# Patient Record
Sex: Female | Born: 1980 | Race: Black or African American | Hispanic: No | Marital: Married | Smoking: Former smoker
Health system: Southern US, Community
[De-identification: ages and names within clinical notes are randomized; demographics above are authoritative.]

## PROBLEM LIST (undated history)

## (undated) DIAGNOSIS — O24419 Gestational diabetes mellitus in pregnancy, unspecified control: Secondary | ICD-10-CM

## (undated) DIAGNOSIS — F419 Anxiety disorder, unspecified: Secondary | ICD-10-CM

## (undated) DIAGNOSIS — I10 Essential (primary) hypertension: Secondary | ICD-10-CM

## (undated) DIAGNOSIS — B019 Varicella without complication: Secondary | ICD-10-CM

## (undated) HISTORY — DX: Anxiety disorder, unspecified: F41.9

## (undated) HISTORY — DX: Varicella without complication: B01.9

---
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
## 2000-03-28 ENCOUNTER — Other Ambulatory Visit: Admission: RE | Admit: 2000-03-28 | Discharge: 2000-03-28 | Payer: Self-pay | Admitting: Obstetrics and Gynecology

## 2008-11-24 ENCOUNTER — Encounter: Admission: RE | Admit: 2008-11-24 | Discharge: 2009-01-12 | Payer: Self-pay | Admitting: Obstetrics and Gynecology

## 2009-01-16 ENCOUNTER — Inpatient Hospital Stay (HOSPITAL_COMMUNITY): Admission: AD | Admit: 2009-01-16 | Discharge: 2009-01-20 | Payer: Self-pay | Admitting: Obstetrics and Gynecology

## 2009-11-29 ENCOUNTER — Other Ambulatory Visit (HOSPITAL_COMMUNITY): Payer: Self-pay | Admitting: Obstetrics and Gynecology

## 2009-12-02 ENCOUNTER — Encounter (INDEPENDENT_AMBULATORY_CARE_PROVIDER_SITE_OTHER): Payer: Self-pay | Admitting: Obstetrics and Gynecology

## 2009-12-02 ENCOUNTER — Inpatient Hospital Stay (HOSPITAL_COMMUNITY): Admission: AD | Admit: 2009-12-02 | Discharge: 2009-12-05 | Payer: Self-pay | Admitting: Obstetrics and Gynecology

## 2010-03-13 ENCOUNTER — Other Ambulatory Visit (HOSPITAL_COMMUNITY): Payer: Self-pay | Admitting: Obstetrics and Gynecology

## 2010-03-28 ENCOUNTER — Ambulatory Visit: Payer: Self-pay | Admitting: Internal Medicine

## 2010-03-28 LAB — URINALYSIS, ROUTINE W REFLEX MICROSCOPIC
Bilirubin Urine: NEGATIVE
Glucose, UA: NEGATIVE mg/dL
Ketones, ur: NEGATIVE mg/dL
Leukocytes, UA: NEGATIVE
Nitrite: NEGATIVE
Protein, ur: 100 mg/dL — AB
Specific Gravity, Urine: 1.025 (ref 1.005–1.030)
Urobilinogen, UA: 0.2 mg/dL (ref 0.0–1.0)
pH: 6 (ref 5.0–8.0)

## 2010-03-28 LAB — CBC
HCT: 33.3 % — ABNORMAL LOW (ref 36.0–46.0)
HCT: 39.6 % (ref 36.0–46.0)
Hemoglobin: 11.4 g/dL — ABNORMAL LOW (ref 12.0–15.0)
Hemoglobin: 13.3 g/dL (ref 12.0–15.0)
MCH: 30.8 pg (ref 26.0–34.0)
MCH: 31.1 pg (ref 26.0–34.0)
MCHC: 33.3 g/dL (ref 30.0–36.0)
MCHC: 33.6 g/dL (ref 30.0–36.0)
MCV: 92.4 fL (ref 78.0–100.0)
MCV: 92.7 fL (ref 78.0–100.0)
Platelets: 223 10*3/uL (ref 150–400)
Platelets: 225 10*3/uL (ref 150–400)
RBC: 4.14 MIL/uL (ref 3.87–5.11)
RBC: 4.28 MIL/uL (ref 3.87–5.11)
RDW: 13.5 % (ref 11.5–15.5)
RDW: 13.5 % (ref 11.5–15.5)
WBC: 7.6 10*3/uL (ref 4.0–10.5)
WBC: 8.5 10*3/uL (ref 4.0–10.5)
WBC: 9.2 10*3/uL (ref 4.0–10.5)

## 2010-03-28 LAB — COMPREHENSIVE METABOLIC PANEL
AST: 21 U/L (ref 0–37)
AST: 26 U/L (ref 0–37)
Albumin: 2.9 g/dL — ABNORMAL LOW (ref 3.5–5.2)
Alkaline Phosphatase: 117 U/L (ref 39–117)
BUN: 5 mg/dL — ABNORMAL LOW (ref 6–23)
CO2: 25 mEq/L (ref 19–32)
Calcium: 8.5 mg/dL (ref 8.4–10.5)
Chloride: 107 mEq/L (ref 96–112)
Creatinine, Ser: 0.61 mg/dL (ref 0.4–1.2)
GFR calc Af Amer: >60 mL/min (ref >60)
GFR calc Af Amer: >60 mL/min (ref >60)
GFR calc non Af Amer: >60 mL/min (ref >60)
Glucose, Bld: 90 mg/dL (ref 70–99)
Potassium: 3.8 mEq/L (ref 3.5–5.1)
Sodium: 133 mEq/L — ABNORMAL LOW (ref 135–145)
Total Bilirubin: 0.3 mg/dL (ref 0.3–1.2)

## 2010-03-28 LAB — SURGICAL PCR SCREEN
MRSA, PCR: NEGATIVE
Staphylococcus aureus: NEGATIVE

## 2010-03-28 LAB — URIC ACID: Uric Acid, Serum: 4.4 mg/dL (ref 2.4–7.0)

## 2010-03-28 LAB — URINE MICROSCOPIC-ADD ON

## 2010-03-28 LAB — RPR: RPR Ser Ql: NONREACTIVE

## 2010-03-28 LAB — LACTATE DEHYDROGENASE: LDH: 157 U/L (ref 94–250)

## 2010-04-02 LAB — CBC
Hemoglobin: 10.4 g/dL — ABNORMAL LOW (ref 12.0–15.0)
MCHC: 33.7 g/dL (ref 30.0–36.0)
MCV: 95.6 fL (ref 78.0–100.0)
RBC: 3.22 MIL/uL — ABNORMAL LOW (ref 3.87–5.11)
RBC: 4.14 MIL/uL (ref 3.87–5.11)
WBC: 9.4 10*3/uL (ref 4.0–10.5)
WBC: 9.7 10*3/uL (ref 4.0–10.5)

## 2010-04-02 LAB — COMPREHENSIVE METABOLIC PANEL
AST: 26 U/L (ref 0–37)
Albumin: 2.9 g/dL — ABNORMAL LOW (ref 3.5–5.2)
Alkaline Phosphatase: 112 U/L (ref 39–117)
Chloride: 104 mEq/L (ref 96–112)
GFR calc Af Amer: >60 mL/min (ref >60)
Potassium: 4.2 mEq/L (ref 3.5–5.1)
Sodium: 133 mEq/L — ABNORMAL LOW (ref 135–145)
Total Bilirubin: 0.7 mg/dL (ref 0.3–1.2)
Total Protein: 6 g/dL (ref 6.0–8.3)

## 2010-04-02 LAB — RPR: RPR Ser Ql: NONREACTIVE

## 2010-04-20 ENCOUNTER — Ambulatory Visit: Payer: Self-pay | Admitting: Internal Medicine

## 2010-07-13 ENCOUNTER — Ambulatory Visit: Payer: Self-pay | Admitting: Internal Medicine

## 2010-07-13 DIAGNOSIS — Z0289 Encounter for other administrative examinations: Secondary | ICD-10-CM

## 2012-05-08 ENCOUNTER — Ambulatory Visit: Payer: Self-pay | Admitting: Internal Medicine

## 2012-06-13 ENCOUNTER — Other Ambulatory Visit (INDEPENDENT_AMBULATORY_CARE_PROVIDER_SITE_OTHER): Payer: BC Managed Care – PPO

## 2012-06-13 ENCOUNTER — Ambulatory Visit (INDEPENDENT_AMBULATORY_CARE_PROVIDER_SITE_OTHER): Payer: BC Managed Care – PPO | Admitting: Internal Medicine

## 2012-06-13 ENCOUNTER — Encounter: Payer: Self-pay | Admitting: Internal Medicine

## 2012-06-13 VITALS — BP 148/82 | HR 105 | Temp 98.7°F | Ht 64.0 in | Wt 193.0 lb

## 2012-06-13 DIAGNOSIS — Z130 Encounter for screening for diseases of the blood and blood-forming organs and certain disorders involving the immune mechanism: Secondary | ICD-10-CM

## 2012-06-13 DIAGNOSIS — Z1329 Encounter for screening for other suspected endocrine disorder: Secondary | ICD-10-CM

## 2012-06-13 DIAGNOSIS — Z13220 Encounter for screening for lipoid disorders: Secondary | ICD-10-CM

## 2012-06-13 DIAGNOSIS — Z0000 Encounter for general adult medical examination without abnormal findings: Secondary | ICD-10-CM

## 2012-06-13 DIAGNOSIS — Z131 Encounter for screening for diabetes mellitus: Secondary | ICD-10-CM

## 2012-06-13 DIAGNOSIS — F329 Major depressive disorder, single episode, unspecified: Secondary | ICD-10-CM | POA: Insufficient documentation

## 2012-06-13 DIAGNOSIS — F411 Generalized anxiety disorder: Secondary | ICD-10-CM

## 2012-06-13 DIAGNOSIS — I10 Essential (primary) hypertension: Secondary | ICD-10-CM

## 2012-06-13 DIAGNOSIS — Z23 Encounter for immunization: Secondary | ICD-10-CM

## 2012-06-13 DIAGNOSIS — F32A Depression, unspecified: Secondary | ICD-10-CM | POA: Insufficient documentation

## 2012-06-13 LAB — BASIC METABOLIC PANEL
BUN: 15 mg/dL (ref 6–23)
CO2: 27 mEq/L (ref 19–32)
Calcium: 9.8 mg/dL (ref 8.4–10.5)
Chloride: 104 mEq/L (ref 96–112)
Creatinine, Ser: 0.8 mg/dL (ref 0.4–1.2)

## 2012-06-13 LAB — CBC
HCT: 44.6 % (ref 36.0–46.0)
Hemoglobin: 15.3 g/dL — ABNORMAL HIGH (ref 12.0–15.0)
MCHC: 34.3 g/dL (ref 30.0–36.0)
Platelets: 279 10*3/uL (ref 150.0–400.0)
RDW: 13.3 % (ref 11.5–14.6)
WBC: 8.3 10*3/uL (ref 4.5–10.5)

## 2012-06-13 LAB — TSH: TSH: 0.51 u[IU]/mL (ref 0.35–5.50)

## 2012-06-13 LAB — LIPID PANEL
Total CHOL/HDL Ratio: 4
Triglycerides: 84 mg/dL (ref 0.0–149.0)

## 2012-06-13 LAB — HEMOGLOBIN A1C: Hgb A1c MFr Bld: 5.7 % (ref 4.6–6.5)

## 2012-06-13 LAB — LDL CHOLESTEROL, DIRECT: Direct LDL: 176 mg/dL

## 2012-06-13 MED ORDER — FLUOXETINE HCL 10 MG PO CAPS: 10.0000 mg | ORAL_CAPSULE | Freq: Every day | ORAL | Status: DC

## 2012-06-13 MED ORDER — LISINOPRIL 10 MG PO TABS: 10.0000 mg | ORAL_TABLET | Freq: Every day | ORAL | Status: DC

## 2012-06-13 NOTE — Assessment & Plan Note (Signed)
Reassurance given Pt would not like to talk with a therapist at this time Will start prozac 10 mg daily Call me in 1 month to let me know how you are feeling

## 2012-06-13 NOTE — Patient Instructions (Signed)

## 2012-06-13 NOTE — Progress Notes (Signed)
HPI  Pt presents to the clinic today to establish care. She has not seen a PCP in the past. She only sees urgent care when needed. She does have some concerns about her blood pressure. It has range 128-160/80-90. She did have a history of high blood pressure while pregnant and was on Lisinopril. They took her off after she delivered. She also has some concerns about anxiety. She does have a lot of stress which is causing anxiety. 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 She would like to start something to help her with anxiety. She does have a very supportive family and friends. She denies SI/HI.  Flu: 2012 Tetanus: unknown Pap: 2011 Eye doctor: as needed Dentist: as needed LMP: Mirena IUD  History reviewed. No pertinent past medical history.  No current outpatient prescriptions on file.   No current facility-administered medications for this visit.    No Known Allergies  Family History  Problem Relation Age of Onset  . Hypertension Mother   . Heart disease Father   . Hyperlipidemia Father   . Hypertension Father   . Breast cancer Paternal Grandmother     History   Social History  . Marital Status: Single    Spouse Name: N/A    Number of Children: 2  . Years of Education: 12   Occupational History  . Student/Homemaker    Social History Main Topics  . Smoking status: Current Some Day Smoker  . Smokeless tobacco: Never Used  . Alcohol Use: Yes  . Drug Use: Yes  . Sexually Active: Not on file   Other Topics Concern  . Not on file   Social History Narrative   Regular exercise-yes   Caffeine Use-yes    ROS:  Constitutional: Denies fever, malaise, fatigue, headache or abrupt weight changes.  HEENT: Denies eye pain, eye redness, ear pain, ringing in the ears, wax buildup, runny nose, nasal congestion, bloody nose, or sore throat. Respiratory: Denies difficulty breathing, shortness of breath, cough or sputum production.   Cardiovascular:  Denies chest pain, chest tightness, palpitations or swelling in the hands or feet.  Gastrointestinal: Denies abdominal pain, bloating, constipation, diarrhea or blood in the stool.  GU: Denies frequency, urgency, pain with urination, blood in urine, odor or discharge. Musculoskeletal: Denies decrease in range of motion, difficulty with gait, muscle pain or joint pain and swelling.  Skin: Denies redness, rashes, lesions or ulcercations.  Neurological: Denies dizziness, difficulty with memory, difficulty with speech or problems with balance and coordination.   No other specific complaints in a complete review of systems (except as listed in HPI above).  PE:  BP 148/82  Pulse 105  Temp(Src) 98.7 F (37.1 C) (Oral)  Ht 5\' 4"  (1.626 m)  Wt 193 lb (87.544 kg)  BMI 33.11 kg/m2  SpO2 99%  LMP 04/15/2012 Wt Readings from Last 3 Encounters:  06/13/12 193 lb (87.544 kg)    General: Appears her stated age,overweight but well developed, well nourished in NAD. HEENT: Head: normal shape and size; Eyes: sclera white, no icterus, conjunctiva pink, PERRLA and EOMs intact; Ears: Tm's gray and intact, normal light reflex; Nose: mucosa pink and moist, septum midline; Throat/Mouth: Teeth present, mucosa pink and moist, no lesions or ulcerations noted.  Neck: Normal range of motion. Neck supple, trachea midline. No massses, lumps or thyromegaly present.  Cardiovascular: Normal rate and rhythm. S1,S2 noted.  No murmur, rubs or gallops noted. No JVD or BLE edema. No carotid bruits noted.  Pulmonary/Chest: Normal effort and positive vesicular breath sounds. No respiratory distress. No wheezes, rales or ronchi noted.  Abdomen: Soft and nontender. Normal bowel sounds, no bruits noted. No distention or masses noted. Liver, spleen and kidneys non palpable. Musculoskeletal: Normal range of motion. No signs of joint swelling. No difficulty with gait.  Neurological: Alert and oriented. Cranial nerves II-XII intact.  Coordination normal. +DTRs bilaterally. Psychiatric: Mood anxious and affect normal. Behavior is normal. Judgment and thought content normal.      Assessment and Plan:  Preventative Health Maintenance:  Encouraged pt to work on diet and exercise Tdap given today Will obtain basic labs

## 2012-06-13 NOTE — Assessment & Plan Note (Signed)
Will restart Lisinopril today Continue to monitor pressures Continue to work on diet and exercise  RTC in 1 month for followup

## 2012-06-13 NOTE — Addendum Note (Signed)
Addended by: Brenton Grills C on: 06/13/2012 11:57 AM   Modules accepted: Orders

## 2012-06-18 ENCOUNTER — Telehealth: Payer: Self-pay | Admitting: *Deleted

## 2012-06-18 NOTE — Telephone Encounter (Signed)
Left msg on triage stating receive results through mychart but want to speak with nurse need clarification...Victoria Ryan

## 2012-06-18 NOTE — Telephone Encounter (Signed)
Left message for pt to callback office.  

## 2012-06-19 NOTE — Telephone Encounter (Signed)
Pt informed of NP's advisement regarding cholesterol labs.

## 2012-07-01 ENCOUNTER — Telehealth: Payer: Self-pay

## 2012-07-01 NOTE — Telephone Encounter (Signed)
Patient called to inform NP that current dose of prozac has not helped to control her anxiety, it has also caused her " right eye to twitch". Pt would like to know what NP advises. Thanks

## 2012-07-01 NOTE — Telephone Encounter (Signed)
Returned call to pt//unable to leave VM per voice prompt

## 2012-07-01 NOTE — Telephone Encounter (Signed)
She has not given the prozac enough time to work. I told her 4-6 weeks to see full effects and even then we may need to go up on the dose. The ey twitching could be due to stress or anxiety, not necessarily the medication. I want her to give it a few more weeks to see if it is effective or not

## 2012-07-02 NOTE — Telephone Encounter (Signed)
Called pt, no answer x 3// closing phone note until pt calls back.

## 2012-07-02 NOTE — Telephone Encounter (Signed)
Called patient // no answer unable to accept VM

## 2012-11-20 ENCOUNTER — Other Ambulatory Visit: Payer: Self-pay

## 2013-10-30 ENCOUNTER — Other Ambulatory Visit: Payer: Self-pay

## 2014-03-04 ENCOUNTER — Encounter (HOSPITAL_COMMUNITY): Payer: Self-pay | Admitting: Emergency Medicine

## 2014-03-04 ENCOUNTER — Emergency Department (HOSPITAL_COMMUNITY)
Admission: EM | Admit: 2014-03-04 | Discharge: 2014-03-04 | Disposition: A | Discharge status: Home / Self Care | Payer: BLUE CROSS/BLUE SHIELD | Attending: Emergency Medicine | Admitting: Emergency Medicine

## 2014-03-04 DIAGNOSIS — Z87891 Personal history of nicotine dependence: Secondary | ICD-10-CM | POA: Insufficient documentation

## 2014-03-04 DIAGNOSIS — I10 Essential (primary) hypertension: Secondary | ICD-10-CM | POA: Insufficient documentation

## 2014-03-04 DIAGNOSIS — Z79899 Other long term (current) drug therapy: Secondary | ICD-10-CM | POA: Insufficient documentation

## 2014-03-04 DIAGNOSIS — Z3202 Encounter for pregnancy test, result negative: Secondary | ICD-10-CM | POA: Insufficient documentation

## 2014-03-04 HISTORY — DX: Essential (primary) hypertension: I10

## 2014-03-04 LAB — I-STAT CHEM 8, ED
BUN: 17 mg/dL (ref 6–23)
Calcium, Ion: 1.15 mmol/L (ref 1.12–1.23)
Chloride: 103 mmol/L (ref 96–112)
Creatinine, Ser: 0.8 mg/dL (ref 0.50–1.10)
Glucose, Bld: 137 mg/dL — ABNORMAL HIGH (ref 70–99)
HCT: 50 % — ABNORMAL HIGH (ref 36.0–46.0)
Hemoglobin: 17 g/dL — ABNORMAL HIGH (ref 12.0–15.0)
POTASSIUM: 3.7 mmol/L (ref 3.5–5.1)
SODIUM: 141 mmol/L (ref 135–145)
TCO2: 24 mmol/L (ref 0–100)

## 2014-03-04 LAB — I-STAT BETA HCG BLOOD, ED (MC, WL, AP ONLY): I-stat hCG, quantitative: <5 m[IU]/mL (ref <5)

## 2014-03-04 MED ORDER — CLONIDINE HCL 0.1 MG PO TABS
0.2000 mg | ORAL_TABLET | Freq: Once | ORAL | Status: AC
  Administered 2014-03-04: 0.2 mg via ORAL
  Filled 2014-03-04: qty 2

## 2014-03-04 MED ORDER — OXYCODONE-ACETAMINOPHEN 5-325 MG PO TABS
2.0000 | ORAL_TABLET | Freq: Once | ORAL | Status: AC
  Administered 2014-03-04: 2 via ORAL
  Filled 2014-03-04: qty 2

## 2014-03-04 MED ORDER — OXYCODONE-ACETAMINOPHEN 5-325 MG PO TABS: ORAL_TABLET | ORAL | Status: DC

## 2014-03-04 MED ORDER — ACETAMINOPHEN 325 MG PO TABS
975.0000 mg | ORAL_TABLET | Freq: Once | ORAL | Status: DC
  Filled 2014-03-04: qty 3

## 2014-03-04 MED ORDER — LISINOPRIL-HYDROCHLOROTHIAZIDE 10-12.5 MG PO TABS: 1.0000 | ORAL_TABLET | Freq: Every day | ORAL | Status: DC

## 2014-03-04 NOTE — Discharge Instructions (Signed)
Please follow with your primary care doctor in the next 5 days for high blood pressure evaluation. If you do not have a primary care doctor, present to urgent care. Reduce salt intake. Seek emergency medical care for unilateral weakness, slurring, change in vision, or chest pain and shortness of breath.  Take percocet for breakthrough pain, do not drink alcohol, drive, care for children or do other critical tasks while taking percocet.  Please follow with your primary care doctor in the next 2 days for a check-up. They must obtain records for further management.   Do not hesitate to return to the Emergency Department for any new, worsening or concerning symptoms.    Hypertension Hypertension, commonly called high blood pressure, is when the force of blood pumping through your arteries is too strong. Your arteries are the blood vessels that carry blood from your heart throughout your body. A blood pressure reading consists of a higher number over a lower number, such as 110/72. The higher number (systolic) is the pressure inside your arteries when your heart pumps. The lower number (diastolic) is the pressure inside your arteries when your heart relaxes. Ideally you want your blood pressure below 120/80. Hypertension forces your heart to work harder to pump blood. Your arteries may become narrow or stiff. Having hypertension puts you at risk for heart disease, stroke, and other problems.  RISK FACTORS Some risk factors for high blood pressure are controllable. Others are not.  Risk factors you cannot control include:   Race. You may be at higher risk if you are African American.  Age. Risk increases with age.  Gender. Men are at higher risk than women before age 13 years. After age 50, women are at higher risk than men. Risk factors you can control include:  Not getting enough exercise or physical activity.  Being overweight.  Getting too much fat, sugar, calories, or salt in your  diet.  Drinking too much alcohol. SIGNS AND SYMPTOMS Hypertension does not usually cause signs or symptoms. Extremely high blood pressure (hypertensive crisis) may cause headache, anxiety, shortness of breath, and nosebleed. DIAGNOSIS  To check if you have hypertension, your health care provider will measure your blood pressure while you are seated, with your arm held at the level of your heart. It should be measured at least twice using the same arm. Certain conditions can cause a difference in blood pressure between your right and left arms. A blood pressure reading that is higher than normal on one occasion does not mean that you need treatment. If one blood pressure reading is high, ask your health care provider about having it checked again. TREATMENT  Treating high blood pressure includes making lifestyle changes and possibly taking medicine. Living a healthy lifestyle can help lower high blood pressure. You may need to change some of your habits. Lifestyle changes may include:  Following the DASH diet. This diet is high in fruits, vegetables, and whole grains. It is low in salt, red meat, and added sugars.  Getting at least 2 hours of brisk physical activity every week.  Losing weight if necessary.  Not smoking.  Limiting alcoholic beverages.  Learning ways to reduce stress. If lifestyle changes are not enough to get your blood pressure under control, your health care provider may prescribe medicine. You may need to take more than one. Work closely with your health care provider to understand the risks and benefits. HOME CARE INSTRUCTIONS  Have your blood pressure rechecked as directed by your health  care provider.   Take medicines only as directed by your health care provider. Follow the directions carefully. Blood pressure medicines must be taken as prescribed. The medicine does not work as well when you skip doses. Skipping doses also puts you at risk for problems.   Do not  smoke.   Monitor your blood pressure at home as directed by your health care provider. SEEK MEDICAL CARE IF:   You think you are having a reaction to medicines taken.  You have recurrent headaches or feel dizzy.  You have swelling in your ankles.  You have trouble with your vision. SEEK IMMEDIATE MEDICAL CARE IF:  You develop a severe headache or confusion.  You have unusual weakness, numbness, or feel faint.  You have severe chest or abdominal pain.  You vomit repeatedly.  You have trouble breathing. MAKE SURE YOU:   Understand these instructions.  Will watch your condition.  Will get help right away if you are not doing well or get worse. Document Released: 01/01/2005 Document Revised: 05/18/2013 Document Reviewed: 10/24/2012 [facility] Patient Information 2015 [place], [place]. This information is not intended to replace advice given to you by your health care provider. Make sure you discuss any questions you have with your health care provider.

## 2014-03-04 NOTE — ED Provider Notes (Signed)
CSN: [id number]     Arrival date & time 03/04/14  1633 History   First MD Initiated Contact with Patient 03/04/14 1703     Chief Complaint  Patient presents with  . Hypertension     (Consider location/radiation/quality/duration/timing/severity/associated sxs/prior Treatment) HPI   Victoria Ryan is a 34 y.o. female sent from urgent care for evaluation of elevated blood lead pressure. Patient states she has a formal diagnosis of hypertension but she has not taken any blood pressure medications in 2 years to 2 issues with her insurance. Blood pressure was noticed this morning at her oral [facility]'s office and she went to have her wisdom teeth removed. Patient denies any chest pain, shortness of breath, palpitations, abdominal pain, nausea, vomiting, change in vision, dysarthria, ataxia she does endorse a mild global 2 out of 10 headache which she thinks may be related to the wisdom teeth removal. States that when she was taking blood pressure medication it was loose and occur pro hydrochlorothiazide unknown dosage. She states that she has insurance now and will not have an issue establishing care.   Past Medical History  Diagnosis Date  . Hypertension    Past Surgical History  Procedure Laterality Date  . Cesarean section     Family History  Problem Relation Age of Onset  . Hypertension Mother   . Heart disease Father   . Hyperlipidemia Father   . Hypertension Father   . Breast cancer Paternal Grandmother   . Cancer Neg Hx    History  Substance Use Topics  . Smoking status: Former Games developer  . Smokeless tobacco: Never Used  . Alcohol Use: Yes   OB History    No data available     Review of Systems  10 systems reviewed and found to be negative, except as noted in the HPI.   Allergies  Review of patient's allergies indicates no known allergies.  Home Medications   Prior to Admission medications   Medication Sig Start Date End Date Taking? Authorizing Provider   FLUoxetine (PROZAC) 10 MG capsule Take 1 capsule (10 mg total) by mouth daily. 06/13/12   Lorre Munroe, NP  lisinopril (PRINIVIL,ZESTRIL) 10 MG tablet Take 1 tablet (10 mg total) by mouth daily. 06/13/12   Lorre Munroe, NP   BP 211/133 mmHg  Pulse 107  Temp(Src) 98.4 F (36.9 C) (Oral)  Resp 18  SpO2 98% Physical Exam  Constitutional: She is oriented to person, place, and time. She appears well-developed and well-nourished. No distress.  HENT:  Head: Normocephalic and atraumatic.  Mouth/Throat: Oropharynx is clear and moist.  Eyes: Conjunctivae and EOM are normal. Pupils are equal, round, and reactive to light.  Neck: Normal range of motion. Neck supple. No JVD present. No tracheal deviation present.  Cardiovascular: Normal rate, regular rhythm and intact distal pulses.   Pulmonary/Chest: Effort normal and breath sounds normal. No stridor. No respiratory distress. She has no wheezes. She has no rales. She exhibits no tenderness.  Abdominal: Soft. Bowel sounds are normal. She exhibits no distension and no mass. There is no tenderness. There is no rebound and no guarding.  Musculoskeletal: Normal range of motion.  Neurological: She is alert and oriented to person, place, and time.  II-Visual fields grossly intact. III/IV/VI-Extraocular movements intact.  Pupils reactive bilaterally. V/VII-Smile symmetric, equal eyebrow raise,  facial sensation intact VIII- Hearing grossly intact IX/X-Normal gag XI-bilateral shoulder shrug XII-midline tongue extension Motor: 5/5 bilaterally with normal tone and bulk Cerebellar: Normal finger-to-nose  and  normal heel-to-shin test.   Romberg negative Ambulates with a coordinated gait   Psychiatric: She has a normal mood and affect.  Nursing note and vitals reviewed.   ED Course  Procedures (including critical care time) Labs Review Labs Reviewed  I-STAT CHEM 8, ED - Abnormal; Notable for the following:    Glucose, Bld 137 (*)    Hemoglobin  17.0 (*)    HCT 50.0 (*)    All other components within normal limits  I-STAT BETA HCG BLOOD, ED ([facility], [facility], [facility] ONLY)    Imaging Review No results found.   EKG Interpretation None      MDM   Final diagnoses:  Uncontrolled hypertension    Filed Vitals:   03/04/14 1657 03/04/14 1825  BP: 211/133 202/138  Pulse: 107 96  Temp: 98.4 F (36.9 C) 98.2 F (36.8 C)  TempSrc: Oral Oral  Resp: 18 17  SpO2: 98% 99%    Medications  cloNIDine (CATAPRES) tablet 0.2 mg (0.2 mg Oral Given 03/04/14 1732)  oxyCODONE-acetaminophen (PERCOCET/ROXICET) 5-325 MG per tablet 2 tablet (2 tablets Oral Given 03/04/14 1732)    Victoria Ryan is a pleasant 34 y.o. female presenting with extremely elevated blood pressure. This was noticed at her oral surgeons because she had her wisdom teeth removed this morning. Patient has been out of her lisinopril hydrochlorothiazide for 2 years. Patient is reporting a very mild headache. I think this is secondary to having her wisdom teeth removed. Her neuro exam is nonfocal. This is an asymptomatic hypertension. We've had an extensive discussion about the importance of obtaining a primary care for management of her chronic hypertension potential complications including MI or CVA.  Will restart patient's Prinzide and she will follow close for recheck in the next several days. We've had an extensive discussion of return precautions and patient verbalizes her understanding. States that she was not given pain medication from her oral [facility], I'll write her for Percocet for pain   This is a shared visit with the attending physician who personally evaluated the patient and agrees with the care plan.   Evaluation does not show pathology that would require ongoing emergent intervention or inpatient treatment. Pt is hemodynamically stable and mentating appropriately. Discussed findings and plan with patient/guardian, who agrees with care plan. All questions answered. Return  precautions discussed and outpatient follow up given.   Discharge Medication List as of 03/04/2014  6:44 PM    START taking these medications   Details  lisinopril-hydrochlorothiazide (PRINZIDE) 10-12.5 MG per tablet Take 1 tablet by mouth daily., Starting 03/04/2014, Until Discontinued, Print    oxyCODONE-acetaminophen (PERCOCET/ROXICET) 5-325 MG per tablet 1 to 2 tabs PO q6hrs  PRN for pain, Print             Wynetta Emery, PA-C 03/04/14 2156  Purvis Sheffield, MD 03/05/14 1136

## 2014-03-04 NOTE — ED Notes (Signed)
Patient had wisdom teeth extracted today and was told she was very hypertensive. Went to urgent care and BP was approx 200s/100s. Told to come here. Has slight headache. Does not take HTN medications. Denies dizziness, SOB. No other issues noted. Ambulatory with steady gait.

## 2014-03-17 ENCOUNTER — Ambulatory Visit: Payer: Self-pay | Admitting: Family

## 2014-04-12 ENCOUNTER — Telehealth: Payer: Self-pay | Admitting: Family

## 2014-04-12 NOTE — Telephone Encounter (Signed)
Was in hospital about a month ago and was prescribed lisinopril-hydrochlorothiazide (PRINZIDE) 10-12.5 MG per tablet [[id number]] Patient has an appt on 04/05 but needs a refill on this. She says she has lost a whole 30 days. Says the medicine is working well and doesn't want to miss any.

## 2014-04-13 ENCOUNTER — Other Ambulatory Visit: Payer: Self-pay

## 2014-04-13 MED ORDER — LISINOPRIL-HYDROCHLOROTHIAZIDE 10-12.5 MG PO TABS: 1.0000 | ORAL_TABLET | Freq: Every day | ORAL | Status: DC

## 2014-04-13 NOTE — Telephone Encounter (Signed)
Notified pt med has been sent...Victoria Ryan

## 2014-04-13 NOTE — Telephone Encounter (Signed)
Medication sent to [facility] on [address].

## 2014-04-20 ENCOUNTER — Ambulatory Visit (INDEPENDENT_AMBULATORY_CARE_PROVIDER_SITE_OTHER): Payer: BLUE CROSS/BLUE SHIELD | Admitting: Family

## 2014-04-20 ENCOUNTER — Encounter: Payer: Self-pay | Admitting: Family

## 2014-04-20 VITALS — BP 138/104 | HR 113 | Temp 98.0°F | Resp 18 | Ht 64.0 in | Wt 194.4 lb

## 2014-04-20 DIAGNOSIS — I10 Essential (primary) hypertension: Secondary | ICD-10-CM | POA: Diagnosis not present

## 2014-04-20 DIAGNOSIS — F411 Generalized anxiety disorder: Secondary | ICD-10-CM

## 2014-04-20 MED ORDER — OLMESARTAN MEDOXOMIL-HCTZ 20-12.5 MG PO TABS: 1.0000 | ORAL_TABLET | Freq: Every day | ORAL | Status: DC

## 2014-04-20 MED ORDER — ALPRAZOLAM 0.25 MG PO TABS: 0.2500 mg | ORAL_TABLET | Freq: Two times a day (BID) | ORAL | Status: DC | PRN

## 2014-04-20 NOTE — Assessment & Plan Note (Signed)
Patient previously treated for general anxiety disorder with Prozac, which he indicates was ineffective. States she has occasional anxiety secondary to family and work stresses. Start Xanax. Follow-up in one month to determine effectiveness.

## 2014-04-20 NOTE — Progress Notes (Signed)
Pre visit review using our clinic review tool, if applicable. No additional management support is needed unless otherwise documented below in the visit note. 

## 2014-04-20 NOTE — Patient Instructions (Signed)
Thank you for choosing [facility].  Summary/Instructions:  Your prescription(s) have been submitted to your pharmacy or been printed and provided for you. Please take as directed and contact our office if you believe you are having problem(s) with the medication(s) or have any questions.  If your symptoms worsen or fail to improve, please contact our office for further instruction, or in case of emergency go directly to the emergency room at the closest medical facility.   Hypertension Hypertension, commonly called high blood pressure, is when the force of blood pumping through your arteries is too strong. Your arteries are the blood vessels that carry blood from your heart throughout your body. A blood pressure reading consists of a higher number over a lower number, such as 110/72. The higher number (systolic) is the pressure inside your arteries when your heart pumps. The lower number (diastolic) is the pressure inside your arteries when your heart relaxes. Ideally you want your blood pressure below 120/80. Hypertension forces your heart to work harder to pump blood. Your arteries may become narrow or stiff. Having hypertension puts you at risk for heart disease, stroke, and other problems.  RISK FACTORS Some risk factors for high blood pressure are controllable. Others are not.  Risk factors you cannot control include:   Race. You may be at higher risk if you are African American.  Age. Risk increases with age.  Gender. Men are at higher risk than women before age 45 years. After age 65, women are at higher risk than men. Risk factors you can control include:  Not getting enough exercise or physical activity.  Being overweight.  Getting too much fat, sugar, calories, or salt in your diet.  Drinking too much alcohol. SIGNS AND SYMPTOMS Hypertension does not usually cause signs or symptoms. Extremely high blood pressure (hypertensive crisis) may cause headache, anxiety,  shortness of breath, and nosebleed. DIAGNOSIS  To check if you have hypertension, your health care provider will measure your blood pressure while you are seated, with your arm held at the level of your heart. It should be measured at least twice using the same arm. Certain conditions can cause a difference in blood pressure between your right and left arms. A blood pressure reading that is higher than normal on one occasion does not mean that you need treatment. If one blood pressure reading is high, ask your health care provider about having it checked again. TREATMENT  Treating high blood pressure includes making lifestyle changes and possibly taking medicine. Living a healthy lifestyle can help lower high blood pressure. You may need to change some of your habits. Lifestyle changes may include:  Following the DASH diet. This diet is high in fruits, vegetables, and whole grains. It is low in salt, red meat, and added sugars.  Getting at least 2 hours of brisk physical activity every week.  Losing weight if necessary.  Not smoking.  Limiting alcoholic beverages.  Learning ways to reduce stress. If lifestyle changes are not enough to get your blood pressure under control, your health care provider may prescribe medicine. You may need to take more than one. Work closely with your health care provider to understand the risks and benefits. HOME CARE INSTRUCTIONS  Have your blood pressure rechecked as directed by your health care provider.   Take medicines only as directed by your health care provider. Follow the directions carefully. Blood pressure medicines must be taken as prescribed. The medicine does not work as well when you skip doses.   Skipping doses also puts you at risk for problems.   Do not smoke.   Monitor your blood pressure at home as directed by your health care provider. SEEK MEDICAL CARE IF:   You think you are having a reaction to medicines taken.  You have  recurrent headaches or feel dizzy.  You have swelling in your ankles.  You have trouble with your vision. SEEK IMMEDIATE MEDICAL CARE IF:  You develop a severe headache or confusion.  You have unusual weakness, numbness, or feel faint.  You have severe chest or abdominal pain.  You vomit repeatedly.  You have trouble breathing. MAKE SURE YOU:   Understand these instructions.  Will watch your condition.  Will get help right away if you are not doing well or get worse. Document Released: 01/01/2005 Document Revised: 05/18/2013 Document Reviewed: 10/24/2012 [employer] Patient Information 2015 [employer]. This information is not intended to replace advice given to you by your health care provider. Make sure you discuss any questions you have with your health care provider.   

## 2014-04-20 NOTE — Progress Notes (Signed)
   Subjective:    Patient ID: Victoria Ryan, female    DOB: 1980-12-04, 34 y.o.   MRN: [id number]  Chief Complaint  Patient presents with  . Establish Care    Does have concerns about her BP, she tries to get it regulated through exercise    HPI:  Victoria Ryan is a 34 y.o. female who presents today to discuss her blood pressure.   1) Hypertension - Previously diagnosed with hypertension. Currently being treated with lisinopril-hctz. Indicates that she is taking her medication as prescribed. Denies any adverse side effects of the medication. Notes that her home readings are in the 130's/100s. Last eye exam is up to date.  BP Readings from Last 3 Encounters:  04/20/14 138/104  03/04/14 202/138  06/13/12 148/82    2) Anxiety - Associated symptoms of anxiety has been increasing level with an intensity that does mildly effect her functionality. She has previously taken prozac which did not really seem to provide any benefit.  No Known Allergies  Current Outpatient Prescriptions on File Prior to Visit  Medication Sig Dispense Refill  . lisinopril-hydrochlorothiazide (PRINZIDE) 10-12.5 MG per tablet Take 1 tablet by mouth daily. 30 tablet 1   No current facility-administered medications on file prior to visit.    Review of Systems  Eyes:       Negative for changes in vision.   Respiratory: Negative for chest tightness and shortness of breath.   Cardiovascular: Negative for chest pain, palpitations and leg swelling.  Neurological: Negative for headaches.      Objective:    BP 138/104 mmHg  Pulse 113  Temp(Src) 98 F (36.7 C) (Oral)  Resp 18  Ht 5\' 4"  (1.626 m)  Wt 194 lb 6.4 oz (88.179 kg)  BMI 33.35 kg/m2  SpO2 99% Nursing note and vital signs reviewed.  Physical Exam  Constitutional: She is oriented to person, place, and time. She appears well-developed and well-nourished. No distress.  Cardiovascular: Normal rate, regular rhythm, normal heart sounds and  intact distal pulses.   Pulmonary/Chest: Effort normal and breath sounds normal.  Neurological: She is alert and oriented to person, place, and time.  Skin: Skin is warm and dry.  Psychiatric: She has a normal mood and affect. Her behavior is normal. Judgment and thought content normal.       Assessment & Plan:

## 2014-04-20 NOTE — Assessment & Plan Note (Signed)
Hypertension remains uncontrolled with blood pressure greater than goal of 140/90. Discontinue lisinopril-hydrochlorothiazide secondary to mild cough. Start Benicar HCT. Patient instructed to continue to take her blood pressure at home. Follow-up in 2 weeks to determine effectiveness and possible increase in dosage.

## 2014-05-06 ENCOUNTER — Encounter: Payer: Self-pay | Admitting: Family

## 2014-05-06 ENCOUNTER — Ambulatory Visit (INDEPENDENT_AMBULATORY_CARE_PROVIDER_SITE_OTHER): Payer: BLUE CROSS/BLUE SHIELD | Admitting: Family

## 2014-05-06 VITALS — BP 122/70 | HR 89 | Temp 98.3°F | Resp 18 | Ht 64.0 in | Wt 189.1 lb

## 2014-05-06 DIAGNOSIS — I10 Essential (primary) hypertension: Secondary | ICD-10-CM

## 2014-05-06 DIAGNOSIS — F411 Generalized anxiety disorder: Secondary | ICD-10-CM

## 2014-05-06 MED ORDER — ALPRAZOLAM 0.25 MG PO TABS: 0.2500 mg | ORAL_TABLET | Freq: Two times a day (BID) | ORAL | Status: DC | PRN

## 2014-05-06 MED ORDER — HYDROCHLOROTHIAZIDE 25 MG PO TABS: 25.0000 mg | ORAL_TABLET | Freq: Every day | ORAL | Status: DC

## 2014-05-06 NOTE — Assessment & Plan Note (Signed)
Blood pressure is under goal of 140/90 with current regimen and associated cough she previously experienced has been resolved. Discontinue Benicar HCT secondary to cost. Start hydrochlorothiazide 25 mg daily. Follow-up in 2 weeks for check of electrolytes.

## 2014-05-06 NOTE — Progress Notes (Signed)
   Subjective:    Patient ID: Victoria Ryan, female    DOB: [birth year], 34 y.o.   MRN: [id number]  Chief Complaint  Patient presents with  . Follow-up    Blood pressure check up    HPI:  Victoria Ryan is a 33 y.o. female with a PMH of hypertension and anxiety who presents today for a follow up office visit.   1) Hypertension - Previously changed from lisinopril-HCTZ to Benicar HCT. Patient indicates the Benicar even with coupon is expensive and is wondering if there are any other alternatives. She has taken her medication as prescribed. Denies any adverse effects of medication.   BP Readings from Last 3 Encounters:  05/06/14 122/70  04/20/14 138/104  03/04/14 202/138    2) Anxiety - Previously started on Xanax for anxiety and notes significant improvements. Indicates she takes a Xanax about every 3 days. States that it does a good job managing her anxiety.   No Known Allergies   Current Outpatient Prescriptions on File Prior to Visit  Medication Sig Dispense Refill  . ALPRAZolam (XANAX) 0.25 MG tablet Take 1 tablet (0.25 mg total) by mouth 2 (two) times daily as needed for anxiety. 10 tablet 0  . olmesartan-hydrochlorothiazide (BENICAR HCT) 20-12.5 MG per tablet Take 1 tablet by mouth daily. 14 tablet 0   No current facility-administered medications on file prior to visit.    Review of Systems  Respiratory: Negative for chest tightness and shortness of breath.   Cardiovascular: Negative for chest pain, palpitations and leg swelling.  Neurological: Negative for headaches.  Psychiatric/Behavioral: The patient is not nervous/anxious.       Objective:    BP 122/70 mmHg  Pulse 89  Temp(Src) 98.3 F (36.8 C) (Oral)  Resp 18  Ht  (1.626 m)  Wt 189 lb 1.9 oz (85.784 kg)  BMI 32.45 kg/m2  SpO2 98% Nursing note and vital signs reviewed.  Physical Exam  Constitutional: She is oriented to person, place, and time. She appears well-developed and well-nourished.  No distress.  Cardiovascular: Normal rate, regular rhythm, normal heart sounds and intact distal pulses.   Pulmonary/Chest: Effort normal and breath sounds normal.  Neurological: She is alert and oriented to person, place, and time.  Skin: Skin is warm and dry.  Psychiatric: She has a normal mood and affect. Her behavior is normal. Judgment and thought content normal.       Assessment & Plan:

## 2014-05-06 NOTE — Assessment & Plan Note (Signed)
Anxiety is well controlled with current regimen of Xanax. Refill Xanax. Continue current dosage of Xanax

## 2014-05-06 NOTE — Patient Instructions (Signed)
Thank you for choosing [facility].  Summary/Instructions:  Please stop by the lab in about 2 weeks for a blood test of your electrolytes.   Your prescription(s) have been submitted to your pharmacy or been printed and provided for you. Please take as directed and contact our office if you believe you are having problem(s) with the medication(s) or have any questions.  If your symptoms worsen or fail to improve, please contact our office for further instruction, or in case of emergency go directly to the emergency room at the closest medical facility.

## 2014-06-03 ENCOUNTER — Other Ambulatory Visit: Payer: Self-pay | Admitting: Family

## 2014-06-04 ENCOUNTER — Telehealth: Payer: Self-pay

## 2014-06-04 MED ORDER — ALPRAZOLAM 0.25 MG PO TABS: 0.2500 mg | ORAL_TABLET | Freq: Two times a day (BID) | ORAL | Status: DC | PRN

## 2014-06-04 NOTE — Addendum Note (Signed)
Addended by: Jeanine Luz D on: 06/04/2014 09:43 AM   Modules accepted: Orders

## 2014-06-04 NOTE — Telephone Encounter (Signed)
LVM for pt to call back to verify the pharmacy she wants her xanax sent into.

## 2014-06-07 NOTE — Telephone Encounter (Signed)
Rx sent. LVM letting pt know.  

## 2014-06-07 NOTE — Telephone Encounter (Signed)
[facility] on [address] [place] is her Pharmacy

## 2014-06-17 ENCOUNTER — Telehealth: Payer: Self-pay | Admitting: Family

## 2014-06-17 NOTE — Telephone Encounter (Signed)
PLEASE NOTE: All timestamps contained within this report are represented as [place] Standard Time. CONFIDENTIALTY NOTICE: This fax transmission is intended only for the addressee. It contains information that is legally privileged, confidential or otherwise protected from use or disclosure. If you are not the intended recipient, you are strictly prohibited from reviewing, disclosing, copying using or disseminating any of this information or taking any action in reliance on or regarding this information. If you have received this fax in error, please notify us immediately by telephone so that we can arrange for its return to [place]. Phone: [phone], Toll-Free: [phone], Fax: [phone] Page: 1 of 1 Call Id: [id number] [facility] - Client TELEPHONE ADVICE RECORD [facility] Patient Name: Victoria Ryan DOB: [birth year] Initial Comment Caller states missed nurse's callback; caller states she has developed a blister on her footfrom running - it has pus and her foot is aching Nurse Assessment Nurse: Apolinar Junes, RN, Darl Pikes Date/Time Lamount Cohen Time): 06/17/2014 12:09:35 PM Confirm and document reason for call. If symptomatic, describe symptoms. ---caller states she has developed a blister on her foot from running - its on the bottom of the 3rd toe - it has fluid in it and her foot is aching - Has the patient traveled out of the country within the last 30 days? ---Not Applicable Does the patient require triage? ---Yes Related visit to physician within the last 2 weeks? ---No Does the PT have any chronic conditions? (i.e. diabetes, asthma, etc.) ---No Did the patient indicate they were pregnant? ---No Guidelines Guideline Title Affirmed Question Affirmed Notes Blister - Foot and Hand Normal blister from friction (all triage questions negative) Final Disposition User [facility], RN, Darl Pikes

## 2014-06-17 NOTE — Telephone Encounter (Signed)
Patient Name: Victoria Ryan DOB: [birth year] Initial Comment caller states she has developed a blister on her foot from running - it has pus and her foot is aching Nurse Assessment Guidelines Guideline Title Affirmed Question Affirmed Notes Final Disposition User FINAL ATTEMPT MADE - no message left [place], Charity fundraiser, Stark Bray

## 2014-06-22 ENCOUNTER — Telehealth: Payer: Self-pay | Admitting: *Deleted

## 2014-06-22 NOTE — Telephone Encounter (Signed)
[facility] - Client TELEPHONE ADVICE RECORD [facility] Patient Name: Victoria Ryan Gender: Female DOB: 10-10-1980 Age: 34 Y 3 M 7 D Return Phone Number: [phone] (Primary) Address: City/State/Zip: [place] Client [facility] - Client Client Site  [facility] Physician Marcos Eke Contact Type Call Call Type Triage / Clinical Relationship To Patient Self Appointment Disposition EMR Caller Not Reached Info pasted into Epic Yes Return Phone Number [phone] (Primary) Chief Complaint Foot Pain Initial Comment caller states she has developed a blister on her foot from running - it has pus and her foot is aching Nurse Assessment Guidelines Guideline Title Affirmed Question Affirmed Notes Nurse Date/Time (Eastern Time) Disp. Time Lamount Cohen Time) Disposition Final User 06/17/2014 8:41:29 AM Attempt made - message left Elijah Birk RN, Stark Bray 06/17/2014 9:05:41 AM Attempt made - message left Elijah Birk RN, Stark Bray 06/17/2014 9:34:22 AM FINAL ATTEMPT MADE - no message left Yes Elijah Birk, RN, Stark Bray After Care Instructions Given Call Event Type User Date / Time Description

## 2014-07-02 ENCOUNTER — Other Ambulatory Visit: Payer: Self-pay | Admitting: Family

## 2014-07-06 ENCOUNTER — Telehealth: Payer: Self-pay

## 2014-07-06 ENCOUNTER — Other Ambulatory Visit: Payer: Self-pay | Admitting: Family

## 2014-07-06 MED ORDER — ALPRAZOLAM 0.25 MG PO TABS: 0.2500 mg | ORAL_TABLET | Freq: Two times a day (BID) | ORAL | Status: DC | PRN

## 2014-07-06 NOTE — Telephone Encounter (Signed)
Called pt to verify what pharmacy she wanted her xanax sent to.

## 2014-07-06 NOTE — Telephone Encounter (Signed)
Patient would like script sent to [facility] on [address].

## 2014-07-06 NOTE — Telephone Encounter (Signed)
rx sent

## 2014-07-07 ENCOUNTER — Telehealth: Payer: Self-pay | Admitting: Family

## 2014-07-07 NOTE — Telephone Encounter (Signed)
Patient is requesting to have her dosage on her xanax raised b/c she is going through a death in the family and does not feel like the current dosage is working.  [facility] [address].

## 2014-07-08 MED ORDER — ALPRAZOLAM 0.25 MG PO TABS: 0.2500 mg | ORAL_TABLET | Freq: Two times a day (BID) | ORAL | Status: DC | PRN

## 2014-07-08 NOTE — Telephone Encounter (Signed)
Pt aware.

## 2014-07-08 NOTE — Telephone Encounter (Signed)
She can increase her dose to 2 pills twice daily as needed for anxiety.

## 2014-07-15 ENCOUNTER — Telehealth: Payer: Self-pay | Admitting: Family

## 2014-07-16 ENCOUNTER — Telehealth: Payer: Self-pay | Admitting: Internal Medicine

## 2014-07-16 MED ORDER — ALPRAZOLAM 0.25 MG PO TABS: 0.2500 mg | ORAL_TABLET | Freq: Two times a day (BID) | ORAL | Status: DC | PRN

## 2014-07-16 NOTE — Telephone Encounter (Signed)
Done hardcopy to Dahlia  

## 2014-07-16 NOTE — Telephone Encounter (Signed)
Rx faxed to pharmacy  

## 2014-07-16 NOTE — Addendum Note (Signed)
Addended by: Corwin Levins on: 07/16/2014 12:29 PM   Modules accepted: Orders

## 2014-08-03 ENCOUNTER — Other Ambulatory Visit: Payer: Self-pay | Admitting: Internal Medicine

## 2014-08-03 MED ORDER — ALPRAZOLAM 0.25 MG PO TABS: 0.2500 mg | ORAL_TABLET | Freq: Two times a day (BID) | ORAL | Status: DC | PRN

## 2014-08-03 NOTE — Addendum Note (Signed)
Addended by: Jeanine Luz D on: 08/03/2014 02:18 PM   Modules accepted: Orders

## 2014-08-13 ENCOUNTER — Other Ambulatory Visit: Payer: Self-pay | Admitting: Family

## 2014-08-16 ENCOUNTER — Other Ambulatory Visit: Payer: Self-pay

## 2014-08-16 MED ORDER — HYDROCHLOROTHIAZIDE 25 MG PO TABS: 25.0000 mg | ORAL_TABLET | Freq: Every day | ORAL | Status: DC

## 2014-09-28 ENCOUNTER — Other Ambulatory Visit: Payer: Self-pay | Admitting: Family

## 2014-09-29 MED ORDER — ALPRAZOLAM 0.25 MG PO TABS: 0.2500 mg | ORAL_TABLET | Freq: Two times a day (BID) | ORAL | Status: DC | PRN

## 2014-09-29 NOTE — Addendum Note (Signed)
Addended by: Jeanine Luz D on: 09/29/2014 08:18 AM   Modules accepted: Orders

## 2014-11-02 ENCOUNTER — Other Ambulatory Visit: Payer: Self-pay | Admitting: Family

## 2014-11-02 MED ORDER — HYDROCHLOROTHIAZIDE 25 MG PO TABS: 25.0000 mg | ORAL_TABLET | Freq: Every day | ORAL | Status: DC

## 2014-11-02 NOTE — Addendum Note (Signed)
Addended by: Jeanine Luz D on: 11/02/2014 05:22 PM   Modules accepted: Orders

## 2015-02-18 ENCOUNTER — Other Ambulatory Visit: Payer: Self-pay | Admitting: Family

## 2015-02-23 MED ORDER — HYDROCHLOROTHIAZIDE 25 MG PO TABS: 25.0000 mg | ORAL_TABLET | Freq: Every day | ORAL | Status: DC

## 2015-02-23 NOTE — Addendum Note (Signed)
Addended by: Deatra James on: 02/23/2015 11:27 AM   Modules accepted: Orders

## 2015-05-17 ENCOUNTER — Other Ambulatory Visit: Payer: Self-pay | Admitting: Family

## 2015-05-17 ENCOUNTER — Other Ambulatory Visit: Payer: Self-pay

## 2015-05-17 MED ORDER — HYDROCHLOROTHIAZIDE 25 MG PO TABS: 25.0000 mg | ORAL_TABLET | Freq: Every day | ORAL | Status: DC

## 2015-05-18 ENCOUNTER — Other Ambulatory Visit: Payer: Self-pay | Admitting: Family

## 2015-05-23 ENCOUNTER — Ambulatory Visit: Payer: BLUE CROSS/BLUE SHIELD | Admitting: Family

## 2015-05-23 DIAGNOSIS — Z0289 Encounter for other administrative examinations: Secondary | ICD-10-CM

## 2015-05-27 ENCOUNTER — Encounter: Payer: Self-pay | Admitting: Family

## 2015-05-27 ENCOUNTER — Ambulatory Visit (INDEPENDENT_AMBULATORY_CARE_PROVIDER_SITE_OTHER): Payer: 59 | Admitting: Family

## 2015-05-27 VITALS — BP 138/82 | HR 98 | Temp 97.9°F | Resp 12 | Ht 64.0 in | Wt 200.8 lb

## 2015-05-27 DIAGNOSIS — F411 Generalized anxiety disorder: Secondary | ICD-10-CM | POA: Diagnosis not present

## 2015-05-27 DIAGNOSIS — I10 Essential (primary) hypertension: Secondary | ICD-10-CM | POA: Diagnosis not present

## 2015-05-27 MED ORDER — ALPRAZOLAM 0.25 MG PO TABS: 0.2500 mg | ORAL_TABLET | Freq: Two times a day (BID) | ORAL | Status: DC | PRN

## 2015-05-27 NOTE — Progress Notes (Signed)
Pre visit review using our clinic review tool, if applicable. No additional management support is needed unless otherwise documented below in the visit note. 

## 2015-05-27 NOTE — Assessment & Plan Note (Signed)
Blood pressure elevated today although home blood pressure readings are well below goal 140/90 with current regimen. Denies adverse side effects or symptoms of end organ damage. Continue current dosage of hydrochlorothiazide. Encouraged to continue monitoring blood pressure at home. Follow-up if symptoms are no longer controlled with current medication regimen.

## 2015-05-27 NOTE — Patient Instructions (Signed)
Thank you for choosing [facility].  Summary/Instructions:  Please continue to take your medications as prescribed.   Your prescription(s) have been submitted to your pharmacy or been printed and provided for you. Please take as directed and contact our office if you believe you are having problem(s) with the medication(s) or have any questions.  If your symptoms worsen or fail to improve, please contact our office for further instruction, or in case of emergency go directly to the emergency room at the closest medical facility.     

## 2015-05-27 NOTE — Progress Notes (Signed)
Subjective:    Patient ID: Victoria Ryan, female    DOB: [birth year], 35 y.o.   MRN: [id number]  Chief Complaint  Patient presents with  . Follow-up    no problems today    HPI:  Victoria Ryan is a 35 y.o. female who  has a past medical history of Hypertension and Chicken pox. and presents today for a follow up.   1.) Hypertension - Currently maintained on hydrochlorothiazide. Reports taking the medication as prescribed and denies adverse side effects. No hypotensive readings. Blood pressures at home have been averaging less than 140/90. Denies any symptoms of end organ damage.   BP Readings from Last 3 Encounters:  05/27/15 138/82  05/06/14 122/70  04/20/14 138/104    2.) Anxiety - Currently maintained on alprazolam as needed. Reports taking the medication as prescribed and denies adverse side effects. Indicates that she is currently out of the medication but is well controlled without it at the moment.   No Known Allergies   Current Outpatient Prescriptions on File Prior to Visit  Medication Sig Dispense Refill  . hydrochlorothiazide (HYDRODIURIL) 25 MG tablet Take 1 tablet (25 mg total) by mouth daily. 90 tablet 0   No current facility-administered medications on file prior to visit.    Review of Systems  Constitutional: Negative for fever and chills.  Eyes:       Negative for changes in vision  Respiratory: Negative for cough, chest tightness and wheezing.   Cardiovascular: Negative for chest pain, palpitations and leg swelling.  Neurological: Negative for dizziness, weakness and light-headedness.      Objective:    BP 138/82 mmHg  Pulse 98  Temp(Src) 97.9 F (36.6 C) (Oral)  Resp 12  Ht 5\' 4"  (1.626 m)  Wt 200 lb 12.8 oz (91.082 kg)  BMI 34.45 kg/m2  SpO2 97% Nursing note and vital signs reviewed.  Physical Exam  Constitutional: She is oriented to person, place, and time. She appears well-developed and well-nourished. No distress.    Cardiovascular: Normal rate, regular rhythm, normal heart sounds and intact distal pulses.   Pulmonary/Chest: Effort normal and breath sounds normal.  Neurological: She is alert and oriented to person, place, and time.  Skin: Skin is warm and dry.  Psychiatric: She has a normal mood and affect. Her behavior is normal. Judgment and thought content normal.       Assessment & Plan:   Problem List Items Addressed This Visit      Cardiovascular and Mediastinum   Essential hypertension, benign - Primary    Blood pressure elevated today although home blood pressure readings are well below goal 140/90 with current regimen. Denies adverse side effects or symptoms of end organ damage. Continue current dosage of hydrochlorothiazide. Encouraged to continue monitoring blood pressure at home. Follow-up if symptoms are no longer controlled with current medication regimen.        Other   Generalized anxiety disorder    Anxiety is well-controlled and not currently taking medication. Refill of Xanax provided to patient request. Continue current dosage of alprazolam as needed.          I am having Ms. Childers maintain her hydrochlorothiazide and ALPRAZolam.   Meds ordered this encounter  Medications  . ALPRAZolam (XANAX) 0.25 MG tablet    Sig: Take 1-2 tablets (0.25-0.5 mg total) by mouth 2 (two) times daily as needed for anxiety.    Dispense:  30 tablet    Refill:  0    Order  Specific Question:  Supervising Provider    Answer:  Hillard Danker A [[id number]]     Follow-up: Return in about 6 months (around 11/27/2015).  Jeanine Luz, FNP

## 2015-05-27 NOTE — Assessment & Plan Note (Signed)
Anxiety is well-controlled and not currently taking medication. Refill of Xanax provided to patient request. Continue current dosage of alprazolam as needed.

## 2015-07-05 ENCOUNTER — Telehealth: Payer: Self-pay

## 2015-07-05 MED ORDER — ALPRAZOLAM 0.5 MG PO TABS: 0.5000 mg | ORAL_TABLET | Freq: Two times a day (BID) | ORAL | Status: DC | PRN

## 2015-07-05 NOTE — Telephone Encounter (Signed)
Please advise. Last refill was 05/27/15

## 2015-07-05 NOTE — Telephone Encounter (Signed)
Medication refilled and increased dose. To be faxed.

## 2015-07-05 NOTE — Telephone Encounter (Signed)
ALPRAZolam (XANAX) 0.25 MG tablet [[id number]]     Patient is requesting a refill on this medication, She is also wanting it moved up to strong dose.Victoria Ryan states that she has been dealing with a lot and taking them more offing.

## 2015-07-06 NOTE — Telephone Encounter (Signed)
LVM letting pt know.  

## 2015-08-16 ENCOUNTER — Other Ambulatory Visit: Payer: Self-pay | Admitting: Family

## 2015-08-19 MED ORDER — ALPRAZOLAM 0.5 MG PO TABS: 0.5000 mg | ORAL_TABLET | Freq: Two times a day (BID) | ORAL | 0 refills | Status: DC | PRN

## 2015-08-19 MED ORDER — HYDROCHLOROTHIAZIDE 25 MG PO TABS: 25.0000 mg | ORAL_TABLET | Freq: Every day | ORAL | 0 refills | Status: DC

## 2015-10-11 ENCOUNTER — Ambulatory Visit: Payer: 59 | Admitting: Family

## 2015-10-27 ENCOUNTER — Other Ambulatory Visit: Payer: Self-pay | Admitting: Family

## 2015-10-27 MED ORDER — ALPRAZOLAM 0.5 MG PO TABS: 0.5000 mg | ORAL_TABLET | Freq: Two times a day (BID) | ORAL | 0 refills | Status: DC | PRN

## 2015-10-27 MED ORDER — HYDROCHLOROTHIAZIDE 25 MG PO TABS: 25.0000 mg | ORAL_TABLET | Freq: Every day | ORAL | 0 refills | Status: DC

## 2015-10-28 NOTE — Telephone Encounter (Signed)
Faxed [facility] to [facility].../lmb 

## 2016-04-12 ENCOUNTER — Telehealth: Payer: Self-pay

## 2016-04-12 MED ORDER — ALPRAZOLAM 0.5 MG PO TABS: 0.5000 mg | ORAL_TABLET | Freq: Two times a day (BID) | ORAL | 0 refills | Status: DC | PRN

## 2016-04-12 MED ORDER — HYDROCHLOROTHIAZIDE 25 MG PO TABS: 25.0000 mg | ORAL_TABLET | Freq: Every day | ORAL | 0 refills | Status: DC

## 2016-04-12 NOTE — Telephone Encounter (Signed)
Phoned in rx.   Left detailed message for patient

## 2016-04-12 NOTE — Telephone Encounter (Signed)
Refill rq for alprazolam.   Pt has scheduled an appt for 04/23/2016 with PCP to follow up.  LOV 05/2015

## 2016-04-12 NOTE — Telephone Encounter (Signed)
Ok to phone in with no refills.  Needs f/u with Tammy Sours

## 2016-04-23 ENCOUNTER — Ambulatory Visit: Payer: 59 | Admitting: Family

## 2016-04-24 ENCOUNTER — Emergency Department (HOSPITAL_COMMUNITY): Payer: BLUE CROSS/BLUE SHIELD

## 2016-04-24 ENCOUNTER — Emergency Department (HOSPITAL_COMMUNITY)
Admission: EM | Admit: 2016-04-24 | Discharge: 2016-04-24 | Disposition: A | Discharge status: Home / Self Care | Payer: BLUE CROSS/BLUE SHIELD | Attending: Emergency Medicine | Admitting: Emergency Medicine

## 2016-04-24 ENCOUNTER — Encounter (HOSPITAL_COMMUNITY): Payer: Self-pay | Admitting: Emergency Medicine

## 2016-04-24 DIAGNOSIS — Z87891 Personal history of nicotine dependence: Secondary | ICD-10-CM | POA: Insufficient documentation

## 2016-04-24 DIAGNOSIS — I10 Essential (primary) hypertension: Secondary | ICD-10-CM | POA: Insufficient documentation

## 2016-04-24 DIAGNOSIS — Z79899 Other long term (current) drug therapy: Secondary | ICD-10-CM | POA: Insufficient documentation

## 2016-04-24 DIAGNOSIS — R079 Chest pain, unspecified: Secondary | ICD-10-CM | POA: Diagnosis not present

## 2016-04-24 DIAGNOSIS — F410 Panic disorder [episodic paroxysmal anxiety] without agoraphobia: Secondary | ICD-10-CM | POA: Diagnosis not present

## 2016-04-24 LAB — CBC WITH DIFFERENTIAL/PLATELET
Basophils Absolute: 0 10*3/uL (ref 0.0–0.1)
Basophils Relative: 0 %
Eosinophils Absolute: 0 10*3/uL (ref 0.0–0.7)
Eosinophils Relative: 0 %
HEMATOCRIT: 41 % (ref 36.0–46.0)
HEMOGLOBIN: 14.3 g/dL (ref 12.0–15.0)
LYMPHS ABS: 2.8 10*3/uL (ref 0.7–4.0)
Lymphocytes Relative: 24 %
MCH: 30.4 pg (ref 26.0–34.0)
MCHC: 34.9 g/dL (ref 30.0–36.0)
MCV: 87.2 fL (ref 78.0–100.0)
MONOS PCT: 4 %
Monocytes Absolute: 0.4 10*3/uL (ref 0.1–1.0)
NEUTROS ABS: 8.2 10*3/uL — AB (ref 1.7–7.7)
NEUTROS PCT: 72 %
Platelets: 233 10*3/uL (ref 150–400)
RBC: 4.7 MIL/uL (ref 3.87–5.11)
RDW: 12.8 % (ref 11.5–15.5)
WBC: 11.5 10*3/uL — ABNORMAL HIGH (ref 4.0–10.5)

## 2016-04-24 LAB — URINALYSIS, ROUTINE W REFLEX MICROSCOPIC
BILIRUBIN URINE: NEGATIVE
Glucose, UA: NEGATIVE mg/dL
HGB URINE DIPSTICK: NEGATIVE
Ketones, ur: NEGATIVE mg/dL
LEUKOCYTES UA: NEGATIVE
Nitrite: NEGATIVE
PH: 6 (ref 5.0–8.0)
Protein, ur: 100 mg/dL — AB
Specific Gravity, Urine: 1.017 (ref 1.005–1.030)

## 2016-04-24 LAB — I-STAT BETA HCG BLOOD, ED (MC, WL, AP ONLY): I-stat hCG, quantitative: <5 m[IU]/mL (ref <5)

## 2016-04-24 LAB — COMPREHENSIVE METABOLIC PANEL
ALK PHOS: 78 U/L (ref 38–126)
ALT: 46 U/L (ref 14–54)
ANION GAP: 7 (ref 5–15)
AST: 28 U/L (ref 15–41)
Albumin: 4.5 g/dL (ref 3.5–5.0)
BILIRUBIN TOTAL: 0.4 mg/dL (ref 0.3–1.2)
BUN: 14 mg/dL (ref 6–20)
CO2: 24 mmol/L (ref 22–32)
Calcium: 9.2 mg/dL (ref 8.9–10.3)
Chloride: 107 mmol/L (ref 101–111)
Creatinine, Ser: 0.86 mg/dL (ref 0.44–1.00)
GFR calc Af Amer: >60 mL/min (ref >60)
GFR calc non Af Amer: >60 mL/min (ref >60)
Glucose, Bld: 118 mg/dL — ABNORMAL HIGH (ref 65–99)
Potassium: 3.8 mmol/L (ref 3.5–5.1)
Sodium: 138 mmol/L (ref 135–145)
Total Protein: 7.6 g/dL (ref 6.5–8.1)

## 2016-04-24 LAB — RAPID URINE DRUG SCREEN, HOSP PERFORMED
AMPHETAMINES: NOT DETECTED
BARBITURATES: NOT DETECTED
Benzodiazepines: POSITIVE — AB
COCAINE: NOT DETECTED
OPIATES: NOT DETECTED
TETRAHYDROCANNABINOL: POSITIVE — AB

## 2016-04-24 LAB — I-STAT TROPONIN, ED: Troponin i, poc: 0 ng/mL (ref 0.00–0.08)

## 2016-04-24 NOTE — Discharge Instructions (Signed)
Resume your daily blood pressure medication.  Follow guidelines for diet and exercise for hypertension.  Use the resource guide to find a counselor for outpatient treatment for anxiety.   Discuss with your doctor the gradual transition (to avoid causing withdrawal which is a dangerous condition for benzodiazepines, which Xanax is) from daily use of Xanax for control of anxiety to occasional as needed use.

## 2016-04-24 NOTE — ED Notes (Signed)
RN at bedside starting IV/collecting labs 

## 2016-04-24 NOTE — ED Provider Notes (Signed)
[facility]-EMERGENCY DEPT Provider Note   CSN: [id number] Arrival date & time: 04/24/16  1146     History   Chief Complaint Chief Complaint  Patient presents with  . Panic Attack    HPI Victoria Ryan is a 36 y.o. female.  HPI Patient reports that she does have a lot of stress. She has been concerned because she is a Merchandiser, retail at work and that has not allowed her to attend some of her daughter's school related functions that she feels that she should be at. Also increased stress of managing at work. She reports she was doing her usual activities but she had been focused on a lot of these issues. She reports she started to breathe very fast and heavy and then it just got worse and worse and her hands got tingly and her pressure in her chest got heavy. Coworkers assisted her to a seated position. She reports she feels she got very close to passing out. EMS came and assisted her to slow down her breathing and she started feel better. She reports she still has some pressure sensation in her chest. Patient poor she has missed several doses of her blood pressure medication over the past 2 days. Ports this is just due to forgetfulness. He denies history of panic attacks. Patient has a Mirena that has been in for 6 years. Past Medical History:  Diagnosis Date  . Chicken pox   . Hypertension     Patient Active Problem List   Diagnosis Date Noted  . Essential hypertension, benign 06/13/2012  . Generalized anxiety disorder 06/13/2012    Past Surgical History:  Procedure Laterality Date  . CESAREAN SECTION      OB History    No data available       Home Medications    Prior to Admission medications   Medication Sig Start Date End Date Taking? Authorizing Provider  ALPRAZolam Prudy Feeler) 0.5 MG tablet Take 1 tablet (0.5 mg total) by mouth 2 (two) times daily as needed for anxiety. 04/12/16  Yes Pincus Sanes, MD  APPLE CIDER VINEGAR PO Take 1 capsule by mouth daily.   Yes Historical  Provider, MD  hydrochlorothiazide (HYDRODIURIL) 25 MG tablet Take 1 tablet (25 mg total) by mouth daily. 04/12/16  Yes Veryl Speak, FNP    Family History Family History  Problem Relation Age of Onset  . Hypertension Mother   . Heart disease Father   . Hyperlipidemia Father   . Hypertension Father   . Breast cancer Paternal Grandmother   . Cancer Neg Hx     Social History Social History  Substance Use Topics  . Smoking status: Former Games developer  . Smokeless tobacco: Never Used  . Alcohol use Yes     Comment: occasionally     Allergies   Patient has no known allergies.   Review of Systems Review of Systems 10 Systems reviewed and are negative for acute change except as noted in the HPI.   Physical Exam Updated Vital Signs BP (!) 159/107 (BP Location: Left Arm)   Pulse 94   Temp 98 F (36.7 C) (Oral)   Resp 16   SpO2 94%   Physical Exam  Constitutional: She is oriented to person, place, and time. She appears well-developed and well-nourished. No distress.  Patient is alert and nontoxic. Mental status is clear. She is not hyperventilating. Moderate obesity.  HENT:  Head: Normocephalic and atraumatic.  Mouth/Throat: Oropharynx is clear and moist.  Eyes: Conjunctivae and  EOM are normal. Pupils are equal, round, and reactive to light.  Neck: Neck supple. No thyromegaly present.  Cardiovascular: Normal rate, regular rhythm and intact distal pulses.   No murmur heard. Pulmonary/Chest: Effort normal and breath sounds normal. No respiratory distress.  Abdominal: Soft. There is no tenderness.  Musculoskeletal: She exhibits no edema or tenderness.  Neurological: She is alert and oriented to person, place, and time. No cranial nerve deficit. She exhibits normal muscle tone. Coordination normal.  Skin: Skin is warm and dry.  Psychiatric: She has a normal mood and affect.  Nursing note and vitals reviewed.    ED Treatments / Results  Labs (all labs ordered are listed,  but only abnormal results are displayed) Labs Reviewed  COMPREHENSIVE METABOLIC PANEL - Abnormal; Notable for the following:       Result Value   Glucose, Bld 118 (*)    All other components within normal limits  CBC WITH DIFFERENTIAL/PLATELET - Abnormal; Notable for the following:    WBC 11.5 (*)    Neutro Abs 8.2 (*)    All other components within normal limits  URINALYSIS, ROUTINE W REFLEX MICROSCOPIC - Abnormal; Notable for the following:    Protein, ur 100 (*)    Bacteria, UA MANY (*)    Squamous Epithelial / LPF 0-5 (*)    All other components within normal limits  RAPID URINE DRUG SCREEN, HOSP PERFORMED - Abnormal; Notable for the following:    Benzodiazepines POSITIVE (*)    Tetrahydrocannabinol POSITIVE (*)    All other components within normal limits  I-STAT TROPOININ, ED  I-STAT BETA HCG BLOOD, ED ([facility], [facility], [facility] ONLY)    EKG  EKG Interpretation  Date/Time:  Tuesday April 24 2016 13:21:46 EDT Ventricular Rate:  96 PR Interval:    QRS Duration: 96 QT Interval:  362 QTC Calculation: 458 R Axis:   -9 Text Interpretation:  Sinus rhythm Low voltage, precordial leads Borderline T abnormalities, anterior leads agree. no old comparisobn Confirmed by Donnald Garre, MD, Lebron Conners [phone]) on 04/24/2016 1:25:48 PM Also confirmed by Donnald Garre, MD, Lebron Conners [phone]), editor [address] [place], Jola Babinski [phone])  on 04/24/2016 1:33:16 PM       Radiology Dg Chest 2 View  Result Date: 04/24/2016 CLINICAL DATA:  Chest pain, heartburn EXAM: CHEST  2 VIEW COMPARISON:  None. FINDINGS: Cardiomediastinal silhouette is unremarkable. No infiltrate or pleural effusion. No pulmonary edema. Bony thorax is unremarkable. The is a first with is the weights the previous IMPRESSION: No active cardiopulmonary disease. Electronically Signed   By: Natasha Mead M.D.   On: 04/24/2016 14:06    Procedures Procedures (including critical care time)  Medications Ordered in ED Medications - No data to display   Initial Impression /  Assessment and Plan / ED Course  I have reviewed the triage vital signs and the nursing notes.  Pertinent labs & imaging results that were available during my care of the patient were reviewed by me and considered in my medical decision making (see chart for details).       Final Clinical Impressions(s) / ED Diagnoses   Final diagnoses:  Panic attack  Essential hypertension  Patient describes symptoms are consistent with a panic attack. Patient has missed about 2 days of her hydrochlorothiazide and blood pressure is elevated. She however does not have signs of endorgan damage. Patient appears to have good home support but does have life stressors with work and managing family needs. She is counseled on the importance of transitioning from chronic  uses Xanax for managing anxiety to when necessary use and other modes of controlling anxiety whether medication or counseling or lifestyle management. I do feel patient is a good candidate for counseling support. Patient has medications she has not run out, she is simply forgetting periodically. She is encouraged and educated on the importance of good management of hypertension and lifestyle modification.  New Prescriptions New Prescriptions   No medications on file     Arby Barrette, MD 04/24/16 1500

## 2016-04-24 NOTE — ED Triage Notes (Signed)
Patient was at work, stressing over things she reports when she had a panic attack.  EMS was called out and 12 lead obtained and vitals were done.  Patient came by POV.  Patient states that she has nausea feeling every day when she wakes up. patient adds that she knows she is not pregnant.

## 2016-05-14 ENCOUNTER — Telehealth: Payer: Self-pay | Admitting: Family

## 2016-05-14 NOTE — Telephone Encounter (Signed)
Left patient a VM to make an appointment for FMLA.  I have FMLA paperwork in my box. Looks like it may have been dropped off. Patient has an appointment on May 9th for a FU? But patient has not been in a year, I am unclear what the FU is for? But patient will need an OV ( ) appointment to have this paperwork filled out. Thank you.

## 2016-05-15 NOTE — Telephone Encounter (Signed)
Patient has an 30 min appointment on May 21 2016. Thank you.

## 2016-05-21 ENCOUNTER — Ambulatory Visit (INDEPENDENT_AMBULATORY_CARE_PROVIDER_SITE_OTHER): Payer: BLUE CROSS/BLUE SHIELD | Admitting: Family

## 2016-05-21 ENCOUNTER — Encounter: Payer: Self-pay | Admitting: Family

## 2016-05-21 VITALS — BP 150/98 | HR 95 | Temp 98.7°F | Resp 16 | Ht 64.0 in | Wt 204.1 lb

## 2016-05-21 DIAGNOSIS — I10 Essential (primary) hypertension: Secondary | ICD-10-CM | POA: Diagnosis not present

## 2016-05-21 DIAGNOSIS — F411 Generalized anxiety disorder: Secondary | ICD-10-CM | POA: Diagnosis not present

## 2016-05-21 MED ORDER — ALPRAZOLAM 0.5 MG PO TABS: 0.5000 mg | ORAL_TABLET | Freq: Two times a day (BID) | ORAL | 0 refills | Status: DC | PRN

## 2016-05-21 MED ORDER — AMLODIPINE BESYLATE 10 MG PO TABS: 10.0000 mg | ORAL_TABLET | Freq: Every day | ORAL | 1 refills | Status: DC

## 2016-05-21 MED ORDER — HYDROCHLOROTHIAZIDE 25 MG PO TABS: 25.0000 mg | ORAL_TABLET | Freq: Every day | ORAL | 1 refills | Status: DC

## 2016-05-21 MED ORDER — ESCITALOPRAM OXALATE 10 MG PO TABS: 10.0000 mg | ORAL_TABLET | Freq: Every day | ORAL | 1 refills | Status: DC

## 2016-05-21 NOTE — Progress Notes (Signed)
Subjective:    Patient ID: Victoria Ryan, female    DOB: [birth year], 36 y.o.   MRN: [id number]  Chief Complaint  Patient presents with  . Follow-up    wants FMLA for anxiety and BP, needs refill of BP meds and xanax    HPI:  Victoria Ryan is a 36 y.o. female who  has a past medical history of Chicken pox and Hypertension. and presents today for an office visit.  1.) Hypertension - Currently maintained on hydrochlorothiazide. Reports taking the medication as prescribed with several missed dosages. No adverse side effects that she is aware of. Denies worst headache of life with no symptoms of end organ damage. Blood pressures at home have remained elevated. Working on a low sodium diet.   BP Readings from Last 3 Encounters:  05/21/16 (!) 150/98  04/24/16 (!) 142/96  05/27/15 138/82      2.)  Anxiety - Currently maintained on alprazolam. 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 No other coping mechanisms or stress. No additional medications have been attempted. Requesting FMLA secondary to recent panic attack while she was at work. This was her first panic attack with no additional episodes since the initial on 04/24/16.   No Known Allergies    Outpatient Medications Prior to Visit  Medication Sig Dispense Refill  . APPLE CIDER VINEGAR PO Take 1 capsule by mouth daily.    Marland Kitchen ALPRAZolam (XANAX) 0.5 MG tablet Take 1 tablet (0.5 mg total) by mouth 2 (two) times daily as needed for anxiety. 60 tablet 0  . hydrochlorothiazide (HYDRODIURIL) 25 MG tablet Take 1 tablet (25 mg total) by mouth daily. 30 tablet 0   No facility-administered medications prior to visit.       Past Surgical History:  Procedure Laterality Date  . CESAREAN SECTION        Past Medical History:  Diagnosis Date  . Chicken pox   . Hypertension       Review of  Systems  Constitutional: Negative for chills and fever.  Eyes:       Negative for changes in vision  Respiratory: Negative for cough, chest tightness, shortness of breath and wheezing.   Cardiovascular: Negative for chest pain, palpitations and leg swelling.  Neurological: Negative for dizziness, weakness and light-headedness.  Psychiatric/Behavioral: Negative for confusion, decreased concentration, dysphoric mood, sleep disturbance and suicidal ideas. The patient is nervous/anxious. The patient is not hyperactive.       Objective:    BP (!) 150/98 (BP Location: Left Arm, Patient Position: Sitting, Cuff Size: Large)   Pulse 95   Temp 98.7 F (37.1 C) (Oral)   Resp 16   Ht 5\' 4"  (1.626 m)   Wt 204 lb 1.9 oz (92.6 kg)   SpO2 96%   BMI 35.04 kg/m  Nursing note and vital signs reviewed.  Physical Exam  Constitutional: She is oriented to person, place, and time. She appears well-developed and well-nourished. No distress.  Cardiovascular: Normal rate, regular rhythm, normal heart sounds and intact distal pulses.   Pulmonary/Chest: Effort normal and breath sounds normal.  Neurological: She is alert and oriented to person, place, and time.  Skin: Skin is warm and dry.  Psychiatric: She has a normal mood and affect. Her behavior is normal. Judgment and thought content normal.       Assessment & Plan:  Problem List Items Addressed This Visit      Cardiovascular and Mediastinum   Essential hypertension, benign - Primary    Blood pressure remains elevated above goal 140/90 with current medication regimen and no adverse side effects. Continue current dosage of hydrochlorothiazide and start amlodipine. Encouraged to monitor blood pressure at home and follow sodium diet. Denies worse headache of life with no symptoms of end organ damage noted on physical exam. Continue to monitor.      Relevant Medications   hydrochlorothiazide (HYDRODIURIL) 25 MG tablet   amLODipine (NORVASC) 10 MG  tablet     Other   Generalized anxiety disorder    Generalized anxiety disorder remains labile and poorly controlled secondary to work-related stressors. Severity  culminated with a panic attack on 04/24/16. discussed importance of stress/stress relief. Continue current dosage of alprazolam. Given daily symptoms start Lexapro. Follow-up in one month or sooner if needed. FMLA paperwork filled in for increased levels of anxiety on intermittent basis.      Relevant Medications   escitalopram (LEXAPRO) 10 MG tablet       I am having [name] start on amLODipine and escitalopram. I am also having her maintain her APPLE CIDER VINEGAR PO, hydrochlorothiazide, and ALPRAZolam.   Meds ordered this encounter  Medications  . hydrochlorothiazide (HYDRODIURIL) 25 MG tablet    Sig: Take 1 tablet (25 mg total) by mouth daily.    Dispense:  90 tablet    Refill:  1    Order Specific Question:   Supervising Provider    Answer:   Hillard Danker A [[id number]]  . amLODipine (NORVASC) 10 MG tablet    Sig: Take 1 tablet (10 mg total) by mouth daily.    Dispense:  30 tablet    Refill:  1    Order Specific Question:   Supervising Provider    Answer:   Hillard Danker A [[id number]]  . escitalopram (LEXAPRO) 10 MG tablet    Sig: Take 1 tablet (10 mg total) by mouth daily.    Dispense:  30 tablet    Refill:  1    Order Specific Question:   Supervising Provider    Answer:   Hillard Danker A [[id number]]  . ALPRAZolam (XANAX) 0.5 MG tablet    Sig: Take 1 tablet (0.5 mg total) by mouth 2 (two) times daily as needed for anxiety.    Dispense:  60 tablet    Refill:  0    Order Specific Question:   Supervising Provider    Answer:   Hillard Danker A [[id number]]     Follow-up: Return in about 1 month (around 06/21/2016), or if symptoms worsen or fail to improve.  Jeanine Luz, FNP

## 2016-05-21 NOTE — Patient Instructions (Signed)
Thank you for choosing [facility].  SUMMARY AND INSTRUCTIONS:  Please continue to take the hydrochlorothiazide.  Start amlodipine for your blood pressure.  Start Lexapro for your anxiety.  Continue the current dosage of alprazolam as needed.  Continue to monitor your blood pressures at home and follow a low sodium diet.   Medication:  Your prescription(s) have been submitted to your pharmacy or been printed and provided for you. Please take as directed and contact our office if you believe you are having problem(s) with the medication(s) or have any questions.  Follow up:  If your symptoms worsen or fail to improve, please contact our office for further instruction, or in case of emergency go directly to the emergency room at the closest medical facility.    Stress and Stress Management Stress is a normal reaction to life events. It is what you feel when life demands more than you are used to or more than you can handle. Some stress can be useful. For example, the stress reaction can help you catch the last bus of the day, study for a test, or meet a deadline at work. But stress that occurs too often or for too long can cause problems. It can affect your emotional health and interfere with relationships and normal daily activities. Too much stress can weaken your immune system and increase your risk for physical illness. If you already have a medical problem, stress can make it worse. What are the causes? All sorts of life events may cause stress. An event that causes stress for one person may not be stressful for another person. Major life events commonly cause stress. These may be positive or negative. Examples include losing your job, moving into a new home, getting married, having a baby, or losing a loved one. Less obvious life events may also cause stress, especially if they occur day after day or in combination. Examples include working long hours, driving in traffic, caring  for children, being in debt, or being in a difficult relationship. What are the signs or symptoms? Stress may cause emotional symptoms including, the following:  Anxiety. This is feeling worried, afraid, on edge, overwhelmed, or out of control.  Anger. This is feeling irritated or impatient.  Depression. This is feeling sad, down, helpless, or guilty.  Difficulty focusing, remembering, or making decisions. Stress may cause physical symptoms, including the following:  Aches and pains. These may affect your head, neck, back, stomach, or other areas of your body.  Tight muscles or clenched jaw.  Low energy or trouble sleeping. Stress may cause unhealthy behaviors, including the following:  Eating to feel better (overeating) or skipping meals.  Sleeping too little, too much, or both.  Working too much or putting off tasks (procrastination).  Smoking, drinking alcohol, or using drugs to feel better. How is this diagnosed? Stress is diagnosed through an assessment by your health care provider. Your health care provider will ask questions about your symptoms and any stressful life events.Your health care provider will also ask about your medical history and may order blood tests or other tests. Certain medical conditions and medicine can cause physical symptoms similar to stress. Mental illness can cause emotional symptoms and unhealthy behaviors similar to stress. Your health care provider may refer you to a mental health professional for further evaluation. How is this treated? Stress management is the recommended treatment for stress.The goals of stress management are reducing stressful life events and coping with stress in healthy ways. Techniques for reducing  stressful life events include the following:  Stress identification. Self-monitor for stress and identify what causes stress for you. These skills may help you to avoid some stressful events.  Time management. Set your  priorities, keep a calendar of events, and learn to say "no." These tools can help you avoid making too many commitments. Techniques for coping with stress include the following:  Rethinking the problem. Try to think realistically about stressful events rather than ignoring them or overreacting. Try to find the positives in a stressful situation rather than focusing on the negatives.  Exercise. Physical exercise can release both physical and emotional tension. The key is to find a form of exercise you enjoy and do it regularly.  Relaxation techniques. These relax the body and mind. Examples include yoga, meditation, tai chi, biofeedback, deep breathing, progressive muscle relaxation, listening to music, being out in nature, journaling, and other hobbies. Again, the key is to find one or more that you enjoy and can do regularly.  Healthy lifestyle. Eat a balanced diet, get plenty of sleep, and do not smoke. Avoid using alcohol or drugs to relax.  Strong support network. Spend time with family, friends, or other people you enjoy being around.Express your feelings and talk things over with someone you trust. Counseling or talktherapy with a mental health professional may be helpful if you are having difficulty managing stress on your own. Medicine is typically not recommended for the treatment of stress.Talk to your health care provider if you think you need medicine for symptoms of stress. Follow these instructions at home:  Keep all follow-up visits as directed by your health care provider.  Take all medicines as directed by your health care provider. Contact a health care provider if:  Your symptoms get worse or you start having new symptoms.  You feel overwhelmed by your problems and can no longer manage them on your own. Get help right away if:  You feel like hurting yourself or someone else. This information is not intended to replace advice given to you by your health care provider.  Make sure you discuss any questions you have with your health care provider. Document Released: 06/27/2000 Document Revised: 06/09/2015 Document Reviewed: 08/26/2012 Elsevier Interactive Patient Education  2017 [employer].

## 2016-05-21 NOTE — Assessment & Plan Note (Signed)
Blood pressure remains elevated above goal 140/90 with current medication regimen and no adverse side effects. Continue current dosage of hydrochlorothiazide and start amlodipine. Encouraged to monitor blood pressure at home and follow sodium diet. Denies worse headache of life with no symptoms of end organ damage noted on physical exam. Continue to monitor.

## 2016-05-21 NOTE — Assessment & Plan Note (Addendum)
Generalized anxiety disorder remains labile and poorly controlled secondary to work-related stressors. Severity  culminated with a panic attack on 04/24/16. discussed importance of stress/stress relief. Continue current dosage of alprazolam. Given daily symptoms start Lexapro. Follow-up in one month or sooner if needed. FMLA paperwork filled in for increased levels of anxiety on intermittent basis.

## 2016-05-23 ENCOUNTER — Ambulatory Visit: Payer: 59 | Admitting: Family

## 2016-05-30 DIAGNOSIS — Z0279 Encounter for issue of other medical certificate: Secondary | ICD-10-CM

## 2016-06-21 ENCOUNTER — Encounter: Payer: Self-pay | Admitting: Family

## 2016-06-21 ENCOUNTER — Ambulatory Visit (INDEPENDENT_AMBULATORY_CARE_PROVIDER_SITE_OTHER): Payer: BLUE CROSS/BLUE SHIELD | Admitting: Family

## 2016-06-21 VITALS — BP 136/72 | HR 106 | Temp 98.4°F | Resp 16 | Ht 64.0 in | Wt 193.2 lb

## 2016-06-21 DIAGNOSIS — F411 Generalized anxiety disorder: Secondary | ICD-10-CM | POA: Diagnosis not present

## 2016-06-21 DIAGNOSIS — I10 Essential (primary) hypertension: Secondary | ICD-10-CM

## 2016-06-21 NOTE — Assessment & Plan Note (Signed)
Anxiety is well controlled with current medication regimen and no adverse side effects. Continue current dosage of Lexapro and alprazolam. [place] controlled substance database reviewed with no irregularities. Continue to monitor.

## 2016-06-21 NOTE — Assessment & Plan Note (Signed)
Plan pressure appears adequately controlled with local 140/90 with current medication regimen and no  hypotensive readings. Denies worst headache of life with no new symptoms of end organ damage noted on physical exam. Continue current dosage of amlodipine and hydrochlorothiazide. Continue to monitor blood pressure at home and follow low-sodium diet.

## 2016-06-21 NOTE — Progress Notes (Signed)
Subjective:    Patient ID: Victoria Ryan, female    DOB: Jun 23, 1980, 36 y.o.   MRN: [id number]  Chief Complaint  Patient presents with  . Follow-up    blood pressure and anxiety    HPI:  Victoria Ryan is a 36 y.o. female who  has a past medical history of Chicken pox and Hypertension. and presents today for a follow up office visit.  1.) Hypertension - Currently maintained on amlodipine and hydrochlorothiazide and reports taking the medications as prescribed and denies adverse side effects or hypotensive readings. Blood pressures at home have remained well controlled. Denies changes in vision, worst headache of life or new symptoms of end organ damage. Working on following a low sodium diet. Has lost about 9 pounds since previous office visit.  BP Readings from Last 3 Encounters:  06/21/16 136/72  05/21/16 (!) 150/98  04/24/16 (!) 142/96    2.) Anxiety  Currently maintained on Lexapro and alprazolam. Reports taking the medication as prescribed and denies adverse side effects. Symptoms are well controlled with the current medication regimen. Taking alprazolam 1-2 times per week.    No Known Allergies    Outpatient Medications Prior to Visit  Medication Sig Dispense Refill  . ALPRAZolam (XANAX) 0.5 MG tablet Take 1 tablet (0.5 mg total) by mouth 2 (two) times daily as needed for anxiety. 60 tablet 0  . amLODipine (NORVASC) 10 MG tablet Take 1 tablet (10 mg total) by mouth daily. 30 tablet 1  . APPLE CIDER VINEGAR PO Take 1 capsule by mouth daily.    Marland Kitchen escitalopram (LEXAPRO) 10 MG tablet Take 1 tablet (10 mg total) by mouth daily. 30 tablet 1  . hydrochlorothiazide (HYDRODIURIL) 25 MG tablet Take 1 tablet (25 mg total) by mouth daily. 90 tablet 1   No facility-administered medications prior to visit.     Review of Systems  Constitutional: Negative for chills and fever.  Eyes:       Negative for changes in vision  Respiratory: Negative for cough, chest tightness and  wheezing.   Cardiovascular: Negative for chest pain, palpitations and leg swelling.  Neurological: Negative for dizziness, weakness and light-headedness.  Psychiatric/Behavioral: Negative for decreased concentration and sleep disturbance. The patient is not nervous/anxious.       Objective:    BP 136/72 (BP Location: Left Arm, Patient Position: Sitting, Cuff Size: Large)   Pulse (!) 106   Temp 98.4 F (36.9 C) (Oral)   Resp 16   Ht 5\' 4"  (1.626 m)   Wt 193 lb 3.2 oz (87.6 kg)   SpO2 98%   BMI 33.16 kg/m  Nursing note and vital signs reviewed.  Physical Exam  Constitutional: She is oriented to person, place, and time. She appears well-developed and well-nourished. No distress.  Cardiovascular: Normal rate, regular rhythm, normal heart sounds and intact distal pulses.   Pulmonary/Chest: Effort normal and breath sounds normal.  Neurological: She is alert and oriented to person, place, and time.  Skin: Skin is warm and dry.  Psychiatric: She has a normal mood and affect. Her behavior is normal. Judgment and thought content normal.       Assessment & Plan:   Problem List Items Addressed This Visit      Cardiovascular and Mediastinum   Essential hypertension, benign - Primary    Plan pressure appears adequately controlled with local 140/90 with current medication regimen and no  hypotensive readings. Denies worst headache of life with no new symptoms of end  organ damage noted on physical exam. Continue current dosage of amlodipine and hydrochlorothiazide. Continue to monitor blood pressure at home and follow low-sodium diet.         Other   Generalized anxiety disorder    Anxiety is well controlled with current medication regimen and no adverse side effects. Continue current dosage of Lexapro and alprazolam. [place] controlled substance database reviewed with no irregularities. Continue to monitor.          I am having Victoria Ryan maintain her APPLE CIDER VINEGAR PO,  hydrochlorothiazide, amLODipine, escitalopram, and ALPRAZolam.   Follow-up: Return in about 6 months (around 12/21/2016), or if symptoms worsen or fail to improve.  Jeanine Luz, FNP

## 2016-06-21 NOTE — Patient Instructions (Signed)
Thank you for choosing [facility].  SUMMARY AND INSTRUCTIONS:  Please continue to take your medications as prescribed.  Monitor blood pressure at home and follow low sodium diet.  You're doing great!  See you in 6 months.    Follow up:  If your symptoms worsen or fail to improve, please contact our office for further instruction, or in case of emergency go directly to the emergency room at the closest medical facility.

## 2016-07-17 ENCOUNTER — Other Ambulatory Visit: Payer: Self-pay | Admitting: Family

## 2016-07-17 DIAGNOSIS — I10 Essential (primary) hypertension: Secondary | ICD-10-CM

## 2016-10-04 ENCOUNTER — Other Ambulatory Visit: Payer: Self-pay | Admitting: Family

## 2016-10-05 NOTE — Telephone Encounter (Signed)
Medication refilled

## 2016-10-05 NOTE — Telephone Encounter (Signed)
Fax sent.

## 2016-10-05 NOTE — Telephone Encounter (Signed)
LVM notifying patient.

## 2016-10-05 NOTE — Telephone Encounter (Signed)
Pt called regarding this. Please advise.

## 2016-10-25 ENCOUNTER — Encounter: Payer: Self-pay | Admitting: Family

## 2016-10-25 ENCOUNTER — Ambulatory Visit (INDEPENDENT_AMBULATORY_CARE_PROVIDER_SITE_OTHER): Payer: BLUE CROSS/BLUE SHIELD | Admitting: Family

## 2016-10-25 VITALS — BP 112/80 | HR 94 | Temp 98.9°F | Resp 16 | Ht 64.0 in | Wt 199.8 lb

## 2016-10-25 DIAGNOSIS — F411 Generalized anxiety disorder: Secondary | ICD-10-CM

## 2016-10-25 DIAGNOSIS — J069 Acute upper respiratory infection, unspecified: Secondary | ICD-10-CM | POA: Insufficient documentation

## 2016-10-25 MED ORDER — ESCITALOPRAM OXALATE 10 MG PO TABS: 10.0000 mg | ORAL_TABLET | Freq: Every day | ORAL | 0 refills | Status: DC

## 2016-10-25 MED ORDER — ALPRAZOLAM 0.5 MG PO TABS: 0.5000 mg | ORAL_TABLET | Freq: Two times a day (BID) | ORAL | 1 refills | Status: DC | PRN

## 2016-10-25 MED ORDER — PREDNISONE 5 MG (21) PO TBPK: ORAL_TABLET | ORAL | 0 refills | Status: DC

## 2016-10-25 NOTE — Patient Instructions (Addendum)
Thank you for choosing [facility].  SUMMARY AND INSTRUCTIONS:  It appears as though you have a viral infection.  Start the prednisone taper.   Continue with OTC medications as needed for symptom relief.   Medication:  Your prescription(s) have been submitted to your pharmacy or been printed and provided for you. Please take as directed and contact our office if you believe you are having problem(s) with the medication(s) or have any questions.  Follow up:  If your symptoms worsen or fail to improve, please contact our office for further instruction, or in case of emergency go directly to the emergency room at the closest medical facility.    General Recommendations:    Please drink plenty of fluids.  Get plenty of rest   Sleep in humidified air  Use saline nasal sprays  Netti pot   OTC Medications:  Decongestants - helps relieve congestion   Flonase (generic fluticasone) or Nasacort (generic triamcinolone) - please make sure to use the "cross-over" technique at a 45 degree angle towards the opposite eye as opposed to straight up the nasal passageway.   Sudafed (generic pseudoephedrine - Note this is the one that is available behind the pharmacy counter); Products with phenylephrine (-PE) may also be used but is often not as effective as pseudoephedrine.   If you have HIGH BLOOD PRESSURE - Coricidin HBP; AVOID any product that is -D as this contains pseudoephedrine which may increase your blood pressure.  Afrin (oxymetazoline) every 6-8 hours for up to 3 days.   Allergies - helps relieve runny nose, itchy eyes and sneezing   Claritin (generic loratidine), Allegra (fexofenidine), or Zyrtec (generic cyrterizine) for runny nose. These medications should not cause drowsiness.  Note - Benadryl (generic diphenhydramine) may be used however may cause drowsiness  Cough -   Delsym or Robitussin (generic dextromethorphan)  Expectorants - helps loosen mucus to  ease removal   Mucinex (generic guaifenesin) as directed on the package.  Headaches / General Aches   Tylenol (generic acetaminophen) - DO NOT EXCEED 3 grams (3,000 mg) in a 24 hour time period  Advil/Motrin (generic ibuprofen)   Sore Throat -   Salt water gargle   Chloraseptic (generic benzocaine) spray or lozenges / Sucrets (generic dyclonine)

## 2016-10-25 NOTE — Progress Notes (Signed)
Subjective:    Patient ID: Victoria Ryan, female    DOB: [birth year], 36 y.o.   MRN: [id number]  Chief Complaint  Patient presents with  . FMLA    needs FMLA papers filled out, also has cough and throat is bothering her    HPI:  Victoria Ryan is a 36 y.o. female who  has a past medical history of Chicken pox and Hypertension. and presents today for a follow up office visit.  1.) Anxiety - Currently prescribed Lexapro and alprazolam. Reports taking the medications as prescribed and denies adverse side effects. Symptoms are generally well controlled with occasional exacerbations occurring about 2-3 times per week. Describes that her job remains significantly stressful causing the exacerbations. She currently has FMLA that is do for recertifiation. Has been using up to 4x per month on average.  2.) Cough - This is a new problem. Associated symptoms of congestion, cough and sore throat that has been going on for about 3 days. Denies fevers. No modifying factors or attempted treatments. Course of the symptoms has stayed about the same since initial onset. Does have several sick co-workers.    No Known Allergies    Outpatient Medications Prior to Visit  Medication Sig Dispense Refill  . amLODipine (NORVASC) 10 MG tablet TAKE 1 TABLET BY MOUTH ONCE DAILY 90 tablet 1  . APPLE CIDER VINEGAR PO Take 1 capsule by mouth daily.    . hydrochlorothiazide (HYDRODIURIL) 25 MG tablet Take 1 tablet (25 mg total) by mouth daily. 90 tablet 1  . ALPRAZolam (XANAX) 0.5 MG tablet TAKE 1 TABLET BY MOUTH TWICE DAILY AS NEEDED FOR ANXIETY 60 tablet 0  . escitalopram (LEXAPRO) 10 MG tablet Take 1 tablet (10 mg total) by mouth daily. 30 tablet 1   No facility-administered medications prior to visit.       Past Surgical History:  Procedure Laterality Date  . CESAREAN SECTION        Past Medical History:  Diagnosis Date  . Chicken pox   . Hypertension       Review of Systems    Constitutional: Negative for chills, fatigue and fever.  HENT: Positive for congestion, sneezing and sore throat. Negative for ear pain, sinus pain and sinus pressure.   Respiratory: Positive for cough and shortness of breath (Occasional). Negative for chest tightness.   Neurological: Negative for headaches.      Objective:    BP 112/80 (BP Location: Left Arm, Patient Position: Sitting, Cuff Size: Large)   Pulse 94   Temp 98.9 F (37.2 C) (Oral)   Resp 16   Ht  (1.626 m)   Wt 199 lb 12.8 oz (90.6 kg)   SpO2 98%   BMI 34.30 kg/m  Nursing note and vital signs reviewed.  Physical Exam  Constitutional: She is oriented to person, place, and time. She appears well-developed and well-nourished. No distress.  HENT:  Right Ear: Hearing, tympanic membrane, external ear and ear canal normal.  Left Ear: Hearing, tympanic membrane, external ear and ear canal normal.  Nose: Nose normal.  Mouth/Throat: Uvula is midline, oropharynx is clear and moist and mucous membranes are normal.  Cardiovascular: Normal rate, regular rhythm, normal heart sounds and intact distal pulses.  Exam reveals no gallop and no friction rub.   No murmur heard. Pulmonary/Chest: Effort normal and breath sounds normal. No respiratory distress. She has no wheezes. She has no rales. She exhibits no tenderness.  Neurological: She is alert and oriented to  person, place, and time.  Skin: Skin is warm and dry.  Psychiatric: She has a normal mood and affect. Her behavior is normal. Judgment and thought content normal.       Assessment & Plan:   Problem List Items Addressed This Visit      Respiratory   Acute upper respiratory infection    Symptoms and exam are consistent with an acute upper respiratory infection most likely viral with underlying allergic rhinitis. Start prednisone taper. Continue OTC medications as needed for symptom relief and supportive care.         Other   Generalized anxiety disorder -  Primary    Anxiety appears adequately controlled with the current medication regimen with questionable patient compliance based on refills. FMLA paperwork completed. Encouraged medication adherence. Continue current dosage of Lexapro and alprazolam. NCCSD reviewed with no irregularities.       Relevant Medications   ALPRAZolam (XANAX) 0.5 MG tablet   escitalopram (LEXAPRO) 10 MG tablet       I have changed Ms. Brandt's ALPRAZolam. I am also having her start on predniSONE. Additionally, I am having her maintain her APPLE CIDER VINEGAR PO, hydrochlorothiazide, amLODipine, and escitalopram.   Meds ordered this encounter  Medications  . predniSONE (STERAPRED UNI-PAK 21 TAB) 5 MG (21) TBPK tablet    Sig: Take 6 tablets x 1 day, 5 tablets x 1 day, 4 tablets x 1 day, 3 tablets x 1 day, 2 tablets x 1 day, 1 tablet x 1 day    Dispense:  21 tablet    Refill:  0    Order Specific Question:   Supervising Provider    Answer:   Hillard Danker A [[id number]]  . ALPRAZolam (XANAX) 0.5 MG tablet    Sig: Take 1 tablet (0.5 mg total) by mouth 2 (two) times daily as needed. for anxiety    Dispense:  60 tablet    Refill:  1    Fill on or after 11/11/16    Order Specific Question:   Supervising Provider    Answer:   Hillard Danker A [[id number]]  . escitalopram (LEXAPRO) 10 MG tablet    Sig: Take 1 tablet (10 mg total) by mouth daily.    Dispense:  90 tablet    Refill:  0    Order Specific Question:   Supervising Provider    Answer:   Hillard Danker A [[id number]]     Follow-up: Return in about 3 months (around 01/25/2017), or if symptoms worsen or fail to improve.  Jeanine Luz, FNP

## 2016-10-25 NOTE — Assessment & Plan Note (Signed)
Symptoms and exam are consistent with an acute upper respiratory infection most likely viral with underlying allergic rhinitis. Start prednisone taper. Continue OTC medications as needed for symptom relief and supportive care.

## 2016-10-25 NOTE — Assessment & Plan Note (Signed)
Anxiety appears adequately controlled with the current medication regimen with questionable patient compliance based on refills. FMLA paperwork completed. Encouraged medication adherence. Continue current dosage of Lexapro and alprazolam. NCCSD reviewed with no irregularities.

## 2016-11-16 ENCOUNTER — Telehealth: Payer: Self-pay | Admitting: Family

## 2016-11-16 DIAGNOSIS — I10 Essential (primary) hypertension: Secondary | ICD-10-CM

## 2016-11-16 MED ORDER — HYDROCHLOROTHIAZIDE 25 MG PO TABS: 25.0000 mg | ORAL_TABLET | Freq: Every day | ORAL | 0 refills | Status: DC

## 2016-11-16 NOTE — Telephone Encounter (Signed)
Pt called in and needs refills on her hydrochlorothiazide (HYDRODIURIL) 25 MG tablet [[id number]][[id number]]   [facility] on file

## 2016-11-16 NOTE — Telephone Encounter (Signed)
Per office policy sent 30 day to local pharmacy until appt.../lmb  

## 2016-11-22 ENCOUNTER — Other Ambulatory Visit: Payer: Self-pay | Admitting: *Deleted

## 2016-11-22 DIAGNOSIS — I10 Essential (primary) hypertension: Secondary | ICD-10-CM

## 2016-11-22 MED ORDER — HYDROCHLOROTHIAZIDE 25 MG PO TABS: 25.0000 mg | ORAL_TABLET | Freq: Every day | ORAL | 0 refills | Status: DC

## 2016-11-23 ENCOUNTER — Ambulatory Visit (INDEPENDENT_AMBULATORY_CARE_PROVIDER_SITE_OTHER): Payer: BLUE CROSS/BLUE SHIELD | Admitting: Nurse Practitioner

## 2016-11-23 ENCOUNTER — Encounter: Payer: Self-pay | Admitting: Nurse Practitioner

## 2016-11-23 VITALS — BP 126/86 | HR 90 | Temp 97.8°F | Ht 64.0 in | Wt 206.0 lb

## 2016-11-23 DIAGNOSIS — F411 Generalized anxiety disorder: Secondary | ICD-10-CM | POA: Diagnosis not present

## 2016-11-23 MED ORDER — BUSPIRONE HCL 7.5 MG PO TABS: 7.5000 mg | ORAL_TABLET | Freq: Three times a day (TID) | ORAL | 0 refills | Status: DC

## 2016-11-23 NOTE — Progress Notes (Signed)
Subjective:  Patient ID: Victoria SearlesShannon L Watton, female    DOB: 03/11/1980  Age: 36 y.o. MRN: [id number]  CC: Follow-up (FMLA paper consult--Greg fill one out for her in 05/2016 and 10/2016 for anxiety lead to difficulty to work. )   Anxiety  Presents for follow-up visit. Symptoms include depressed mood, excessive worry, irritability, muscle tension, nervous/anxious behavior and panic. Symptoms occur constantly. The severity of symptoms is causing significant distress and interfering with daily activities. The quality of sleep is fair. Nighttime awakenings: occasional.    related to stressful environment at work. use of prozac in past with no improvement. Reports she is not taking lexapro because it does not help. She uses xanax prn. She is not interested in referral to psychology. She is will to try another medication for anxiety.  FMLA paper complete last month did expire 11/15/2016. She needs forms completed.  Outpatient Medications Prior to Visit  Medication Sig Dispense Refill  . ALPRAZolam (XANAX) 0.5 MG tablet Take 1 tablet (0.5 mg total) by mouth 2 (two) times daily as needed. for anxiety 60 tablet 1  . amLODipine (NORVASC) 10 MG tablet TAKE 1 TABLET BY MOUTH ONCE DAILY 90 tablet 1  . APPLE CIDER VINEGAR PO Take 1 capsule by mouth daily.    . hydrochlorothiazide (HYDRODIURIL) 25 MG tablet Take 1 tablet (25 mg total) daily by mouth. Must keep appt w/new provider for future refills 30 tablet 0  . escitalopram (LEXAPRO) 10 MG tablet Take 1 tablet (10 mg total) by mouth daily. 90 tablet 0  . predniSONE (STERAPRED UNI-PAK 21 TAB) 5 MG (21) TBPK tablet Take 6 tablets x 1 day, 5 tablets x 1 day, 4 tablets x 1 day, 3 tablets x 1 day, 2 tablets x 1 day, 1 tablet x 1 day 21 tablet 0   No facility-administered medications prior to visit.     ROS See HPI  Objective:  BP 126/86   Pulse 90   Temp 97.8 F (36.6 C)   Ht 5\' 4"  (1.626 m)   Wt 206 lb (93.4 kg)   SpO2 98%   BMI 35.36 kg/m    BP Readings from Last 3 Encounters:  11/23/16 126/86  10/25/16 112/80  06/21/16 136/72    Wt Readings from Last 3 Encounters:  11/23/16 206 lb (93.4 kg)  10/25/16 199 lb 12.8 oz (90.6 kg)  06/21/16 193 lb 3.2 oz (87.6 kg)    Physical Exam  Constitutional: She is oriented to person, place, and time. No distress.  Cardiovascular: Normal rate.  Pulmonary/Chest: Effort normal.  Neurological: She is alert and oriented to person, place, and time.  Psychiatric: She has a normal mood and affect. Her behavior is normal. Thought content normal.  Vitals reviewed.   Lab Results  Component Value Date   WBC 11.5 (H) 04/24/2016   HGB 14.3 04/24/2016   HCT 41.0 04/24/2016   PLT 233 04/24/2016   GLUCOSE 118 (H) 04/24/2016   CHOL 245 (H) 06/13/2012   TRIG 84.0 06/13/2012   HDL 60.10 06/13/2012   LDLDIRECT 176.0 06/13/2012   ALT 46 04/24/2016   AST 28 04/24/2016   NA 138 04/24/2016   K 3.8 04/24/2016   CL 107 04/24/2016   CREATININE 0.86 04/24/2016   BUN 14 04/24/2016   CO2 24 04/24/2016   TSH 0.51 06/13/2012   HGBA1C 5.7 06/13/2012    Dg Chest 2 View  Result Date: 04/24/2016 CLINICAL DATA:  Chest pain, heartburn EXAM: CHEST  2 VIEW COMPARISON:  None. FINDINGS: Cardiomediastinal silhouette is unremarkable. No infiltrate or pleural effusion. No pulmonary edema. Bony thorax is unremarkable. The is a first with is the weights the previous IMPRESSION: No active cardiopulmonary disease. Electronically Signed   By: Natasha Mead M.D.   On: 04/24/2016 14:06    Assessment & Plan:   Mayanna was seen today for follow-up.  Diagnoses and all orders for this visit:  Generalized anxiety disorder -     busPIRone (BUSPAR) 7.5 MG tablet; Take 1 tablet (7.5 mg total) 3 (three) times daily by mouth.   I have discontinued Evagelia L. Tischer's predniSONE and escitalopram. I am also having her start on busPIRone. Additionally, I am having her maintain her APPLE CIDER VINEGAR PO, amLODipine,  ALPRAZolam, and hydrochlorothiazide.  Meds ordered this encounter  Medications  . busPIRone (BUSPAR) 7.5 MG tablet    Sig: Take 1 tablet (7.5 mg total) 3 (three) times daily by mouth.    Dispense:  90 tablet    Refill:  0    Order Specific Question:   Supervising Provider    Answer:   Tresa Garter [[id number]]    Follow-up: Return in about 3 weeks (around 12/17/2016) for anxiety with Apolonio Schneiders, NP.  Alysia Penna, NP

## 2016-11-23 NOTE — Patient Instructions (Signed)
F/up with Ashleigh as scheduled.  Discuss FMLA recertification with new pcp.  Buspirone tablets What is this medicine? BUSPIRONE (byoo SPYE rone) is used to treat anxiety disorders. This medicine may be used for other purposes; ask your health care provider or pharmacist if you have questions. COMMON BRAND NAME(S): BuSpar What should I tell my health care provider before I take this medicine? They need to know if you have any of these conditions: -kidney or liver disease -an unusual or allergic reaction to buspirone, other medicines, foods, dyes, or preservatives -pregnant or trying to get pregnant -breast-feeding How should I use this medicine? Take this medicine by mouth with a glass of water. Follow the directions on the prescription label. You may take this medicine with or without food. To ensure that this medicine always works the same way for you, you should take it either always with or always without food. Take your doses at regular intervals. Do not take your medicine more often than directed. Do not stop taking except on the advice of your doctor or health care professional. Talk to your pediatrician regarding the use of this medicine in children. Special care may be needed. Overdosage: If you think you have taken too much of this medicine contact a poison control center or emergency room at once. NOTE: This medicine is only for you. Do not share this medicine with others. What if I miss a dose? If you miss a dose, take it as soon as you can. If it is almost time for your next dose, take only that dose. Do not take double or extra doses. What may interact with this medicine? Do not take this medicine with any of the following medications: -linezolid -MAOIs like Carbex, Eldepryl, Marplan, Nardil, and Parnate -methylene blue -procarbazine This medicine may also interact with the following medications: -diazepam -digoxin -diltiazem -erythromycin -grapefruit  juice -haloperidol -medicines for mental depression or mood problems -medicines for seizures like carbamazepine, phenobarbital and phenytoin -nefazodone -other medications for anxiety -rifampin -ritonavir -some antifungal medicines like itraconazole, ketoconazole, and voriconazole -verapamil -warfarin This list may not describe all possible interactions. Give your health care provider a list of all the medicines, herbs, non-prescription drugs, or dietary supplements you use. Also tell them if you smoke, drink alcohol, or use illegal drugs. Some items may interact with your medicine. What should I watch for while using this medicine? Visit your doctor or health care professional for regular checks on your progress. It may take 1 to 2 weeks before your anxiety gets better. You may get drowsy or dizzy. Do not drive, use machinery, or do anything that needs mental alertness until you know how this drug affects you. Do not stand or sit up quickly, especially if you are an older patient. This reduces the risk of dizzy or fainting spells. Alcohol can make you more drowsy and dizzy. Avoid alcoholic drinks. What side effects may I notice from receiving this medicine? Side effects that you should report to your doctor or health care professional as soon as possible: -blurred vision or other vision changes -chest pain -confusion -difficulty breathing -feelings of hostility or anger -muscle aches and pains -numbness or tingling in hands or feet -ringing in the ears -skin rash and itching -vomiting -weakness Side effects that usually do not require medical attention (report to your doctor or health care professional if they continue or are bothersome): -disturbed dreams, nightmares -headache -nausea -restlessness or nervousness -sore throat and nasal congestion -stomach upset This list may not  describe all possible side effects. Call your doctor for medical advice about side effects. You may  report side effects to FDA at 1-800-FDA-1088. Where should I keep my medicine? Keep out of the reach of children. Store at room temperature below 30 degrees C (86 degrees F). Protect from light. Keep container tightly closed. Throw away any unused medicine after the expiration date. NOTE: This sheet is a summary. It may not cover all possible information. If you have questions about this medicine, talk to your doctor, pharmacist, or health care provider.  2018 Elsevier/Gold Standard (2009-08-11 18:06:11)

## 2016-11-23 NOTE — Assessment & Plan Note (Addendum)
She filled escitalopram but did not take. She states medication has not been helpful. Reports she is taking about 4days off a month due to anxiety. She is not interested in referral to psychology Changed medication to buspar TID. FMLA will be completed and faxed(papers are valid till next appt with new pcp 12/17/16, currently has 3-4times a month). I informed patient that FMLA may possibly change when she follows up with new pcp (number of days off may be decreased). She verbalized understanding.

## 2016-11-26 DIAGNOSIS — Z0279 Encounter for issue of other medical certificate: Secondary | ICD-10-CM

## 2016-12-10 ENCOUNTER — Other Ambulatory Visit: Payer: Self-pay | Admitting: Family

## 2016-12-10 DIAGNOSIS — I10 Essential (primary) hypertension: Secondary | ICD-10-CM

## 2016-12-12 ENCOUNTER — Other Ambulatory Visit: Payer: Self-pay

## 2016-12-12 DIAGNOSIS — I10 Essential (primary) hypertension: Secondary | ICD-10-CM

## 2016-12-12 MED ORDER — AMLODIPINE BESYLATE 10 MG PO TABS: 10.0000 mg | ORAL_TABLET | Freq: Every day | ORAL | 0 refills | Status: DC

## 2016-12-17 ENCOUNTER — Encounter: Payer: Self-pay | Admitting: Nurse Practitioner

## 2016-12-17 ENCOUNTER — Ambulatory Visit (INDEPENDENT_AMBULATORY_CARE_PROVIDER_SITE_OTHER): Payer: BLUE CROSS/BLUE SHIELD | Admitting: Nurse Practitioner

## 2016-12-17 ENCOUNTER — Ambulatory Visit: Payer: BLUE CROSS/BLUE SHIELD | Admitting: Family

## 2016-12-17 VITALS — BP 126/84 | HR 90 | Temp 99.3°F | Resp 16 | Ht 64.0 in | Wt 200.0 lb

## 2016-12-17 DIAGNOSIS — F411 Generalized anxiety disorder: Secondary | ICD-10-CM

## 2016-12-17 NOTE — Assessment & Plan Note (Addendum)
Currently not maintained on medications for anxiety. She has been walking daily to reduce her stress. She requests renewal of FMLA for 2 days a month until MAY. This is a reasonable request, as she is actively working on reducing her stress and anxiety. Beyond May she will not get FMLA renewal, which we discussed and she voiced understanding.

## 2016-12-17 NOTE — Patient Instructions (Signed)
FMLA paperwork should be ready in 24-48 hours, we will call you when this is ready.  Id like to see you back in the next few months for your annual physical.  Keep up the good work with exercise and stress management!  It was nice to meet you. Thanks for letting me take care of you today :)

## 2016-12-17 NOTE — Progress Notes (Signed)
Subjective:    Patient ID: Victoria Ryan, female    DOB: 06-05-1980, 36 y.o.   MRN: [id number]  HPI [name] is a 36 yo female who presents today to establish care. She is transferring to me from another provider in the same clinic.  Generalized anxiety order- maintained on xanax prn. She was recently prescribed Buspar but she did not try the buspar because she "is not into taking medications." she has also not taken the xanax in some time. She has been walking about four times a week to see If that will help her mood, and she has noticed improvement in her stress level since she's been walking. She reports worrying too much about different things and being unable to stop worrying several days a week. She has trouble relaxing. She has not had thoughts of hurting herself or others. She declines counseling and says she is not interested. She is requesting a continuation of her FMLA until May. She feels like she only needs FMLA 2 days per month and is fairly sure that after May, once her busy season is over at work, she will be okay without renewing the FMLA.  Review of Systems  See HPI  Past Medical History:  Diagnosis Date  . Anxiety   . Chicken pox   . Hypertension      Social History   Socioeconomic History  . Marital status: Married    Spouse name: Not on file  . Number of children: 2  . Years of education: 39  . Highest education level: Not on file  Social Needs  . Financial resource strain: Not on file  . Food insecurity - worry: Not on file  . Food insecurity - inability: Not on file  . Transportation needs - medical: Not on file  . Transportation needs - non-medical: Not on file  Occupational History  . Occupation: Clinical biochemist Rep  Tobacco Use  . Smoking status: Former Games developer  . Smokeless tobacco: Never Used  Substance and Sexual Activity  . Alcohol use: Yes    Comment: occasionally  . Drug use: No  . Sexual activity: Yes    Birth control/protection: IUD    Other Topics Concern  . Not on file  Social History Narrative   Regular exercise-yes   Caffeine Use-yes    Past Surgical History:  Procedure Laterality Date  . CESAREAN SECTION      Family History  Problem Relation Age of Onset  . Hypertension Mother   . Heart disease Father   . Hyperlipidemia Father   . Hypertension Father   . Breast cancer Paternal Grandmother   . Cancer Neg Hx     No Known Allergies  Current Outpatient Medications on File Prior to Visit  Medication Sig Dispense Refill  . ALPRAZolam (XANAX) 0.5 MG tablet Take 1 tablet (0.5 mg total) by mouth 2 (two) times daily as needed. for anxiety 60 tablet 1  . amLODipine (NORVASC) 10 MG tablet Take 1 tablet (10 mg total) by mouth daily. 30 tablet 0  . APPLE CIDER VINEGAR PO Take 1 capsule by mouth daily.    . busPIRone (BUSPAR) 7.5 MG tablet Take 1 tablet (7.5 mg total) 3 (three) times daily by mouth. 90 tablet 0  . hydrochlorothiazide (HYDRODIURIL) 25 MG tablet Take 1 tablet (25 mg total) daily by mouth. Must keep appt w/new provider for future refills 30 tablet 0   No current facility-administered medications on file prior to visit.  BP 126/84 (BP Location: Left Arm, Patient Position: Sitting, Cuff Size: Large)   Pulse 90   Temp 99.3 F (37.4 C) (Oral)   Resp 16   Ht 5\' 4"  (1.626 m)   Wt 200 lb (90.7 kg)   SpO2 98%   BMI 34.33 kg/m       Objective:   Physical Exam  Constitutional: She is oriented to person, place, and time. She appears well-developed and well-nourished. No distress.  HENT:  Head: Normocephalic and atraumatic.  Cardiovascular: Normal rate, regular rhythm and intact distal pulses.  Pulmonary/Chest: Effort normal and breath sounds normal.  Neurological: She is alert and oriented to person, place, and time. Coordination normal.  Skin: Skin is warm and dry.  Psychiatric: She has a normal mood and affect. Judgment and thought content normal.      Assessment & Plan:  RTC for CPE

## 2016-12-18 ENCOUNTER — Telehealth: Payer: Self-pay

## 2016-12-18 DIAGNOSIS — Z0279 Encounter for issue of other medical certificate: Secondary | ICD-10-CM

## 2016-12-18 NOTE — Telephone Encounter (Signed)
FMLA forms have been completed and faxed. LVM letting pt know that they have been faxed.

## 2017-01-07 DIAGNOSIS — N62 Hypertrophy of breast: Secondary | ICD-10-CM | POA: Diagnosis not present

## 2017-01-10 ENCOUNTER — Other Ambulatory Visit: Payer: Self-pay | Admitting: Nurse Practitioner

## 2017-01-10 DIAGNOSIS — I10 Essential (primary) hypertension: Secondary | ICD-10-CM

## 2017-01-13 ENCOUNTER — Other Ambulatory Visit: Payer: Self-pay | Admitting: Family

## 2017-01-13 DIAGNOSIS — I10 Essential (primary) hypertension: Secondary | ICD-10-CM

## 2017-01-16 ENCOUNTER — Other Ambulatory Visit: Payer: Self-pay

## 2017-01-16 DIAGNOSIS — I10 Essential (primary) hypertension: Secondary | ICD-10-CM

## 2017-01-16 MED ORDER — HYDROCHLOROTHIAZIDE 25 MG PO TABS: 25.0000 mg | ORAL_TABLET | Freq: Every day | ORAL | 0 refills | Status: DC

## 2017-01-21 ENCOUNTER — Encounter (HOSPITAL_COMMUNITY): Payer: Self-pay | Admitting: Emergency Medicine

## 2017-01-21 ENCOUNTER — Ambulatory Visit (HOSPITAL_COMMUNITY)
Admission: EM | Admit: 2017-01-21 | Discharge: 2017-01-21 | Disposition: A | Discharge status: Home / Self Care | Payer: BLUE CROSS/BLUE SHIELD | Attending: Family Medicine | Admitting: Family Medicine

## 2017-01-21 DIAGNOSIS — B351 Tinea unguium: Secondary | ICD-10-CM | POA: Diagnosis not present

## 2017-01-21 MED ORDER — TERBINAFINE HCL 250 MG PO TABS: 250.0000 mg | ORAL_TABLET | Freq: Every day | ORAL | 0 refills | Status: AC

## 2017-01-21 NOTE — ED Triage Notes (Signed)
PT has been having issues with right large toenail for a year. PT hit it this morning and reports nail is coming off.

## 2017-01-21 NOTE — ED Provider Notes (Signed)
[facility]    CSN: [id number] Arrival date & time: 01/21/17  1136     History   Chief Complaint Chief Complaint  Patient presents with  . Nail Problem    HPI Victoria Ryan is a 37 y.o. female.   37 year old female comes in for toenail problems.  States she has had problems with her toenails for the past year, she hit her right great toe today, and toenail was slightly lifted and came in for evaluation.  She is able to walk without problems, and able to move her toe.  Has not taken anything for the pain.  Denies swelling, erythema, increased warmth, numbness, tingling.       Past Medical History:  Diagnosis Date  . Anxiety   . Chicken pox   . Hypertension     Patient Active Problem List   Diagnosis Date Noted  . Acute upper respiratory infection 10/25/2016  . Essential hypertension, benign 06/13/2012  . Generalized anxiety disorder 06/13/2012    Past Surgical History:  Procedure Laterality Date  . CESAREAN SECTION      OB History    No data available       Home Medications    Prior to Admission medications   Medication Sig Start Date End Date Taking? Authorizing Provider  amLODipine (NORVASC) 10 MG tablet Take 1 tablet (10 mg total) by mouth daily. 12/12/16  Yes Shambley, Audie Box, NP  busPIRone (BUSPAR) 7.5 MG tablet Take 1 tablet (7.5 mg total) 3 (three) times daily by mouth. 11/23/16  Yes Nche, Bonna Gains, NP  hydrochlorothiazide (HYDRODIURIL) 25 MG tablet Take 1 tablet (25 mg total) by mouth daily. Must keep appt w/new provider for future refills 01/16/17  Yes Shambley, Audie Box, NP  APPLE CIDER VINEGAR PO Take 1 capsule by mouth daily.    [provider]  terbinafine (LAMISIL) 250 MG tablet Take 1 tablet (250 mg total) by mouth daily. 01/21/17 02/20/17  Belinda Fisher, PA-C    Family History Family History  Problem Relation Age of Onset  . Hypertension Mother   . Heart disease Father   . Hyperlipidemia Father   . Hypertension  Father   . Breast cancer Paternal Grandmother   . Cancer Neg Hx     Social History Social History   Tobacco Use  . Smoking status: Former Games developer  . Smokeless tobacco: Never Used  Substance Use Topics  . Alcohol use: Yes    Comment: occasionally wine  . Drug use: No     Allergies   Patient has no known allergies.   Review of Systems Review of Systems  Reason unable to perform ROS: See HPI as above.     Physical Exam Triage Vital Signs ED Triage Vitals  Enc Vitals Group     BP 01/21/17 1200 (!) 145/96     Pulse Rate 01/21/17 1200 (!) 124     Resp 01/21/17 1200 16     Temp 01/21/17 1200 99 F (37.2 C)     Temp Source 01/21/17 1200 Oral     SpO2 01/21/17 1200 97 %     Weight 01/21/17 1201 196 lb (88.9 kg)     Height 01/21/17 1201 5\' 4"  (1.626 m)     Head Circumference --      Peak Flow --      Pain Score 01/21/17 1201 6     Pain Loc --      Pain Edu? --  Excl. in GC? --    No data found.  Updated Vital Signs BP (!) 149/96 (BP Location: Left Arm) Comment: Notified RN  Pulse 85   Temp 98 F (36.7 C) (Oral)   Resp 18   Ht 5\' 4"  (1.626 m)   Wt 196 lb (88.9 kg)   SpO2 99%   BMI 33.64 kg/m   Physical Exam  Constitutional: She is oriented to person, place, and time. She appears well-developed and well-nourished. No distress.  HENT:  Head: Normocephalic and atraumatic.  Eyes: Conjunctivae are normal. Pupils are equal, round, and reactive to light.  Musculoskeletal:  See picture below. Thickening of toe nail with slight lifting off the toe. No tenderness on palpation of the toe. Full ROM of toe nail. Sensation intact. Pedal pulses 2+, cap refill at toe pad <2s  Neurological: She is alert and oriented to person, place, and time.          UC Treatments / Results  Labs (all labs ordered are listed, but only abnormal results are displayed) Labs Reviewed - No data to display  EKG  EKG Interpretation None       Radiology No results  found.  Procedures Procedures (including critical care time)  Medications Ordered in UC Medications - No data to display   Initial Impression / Assessment and Plan / UC Course  I have reviewed the triage vital signs and the nursing notes.  Pertinent labs & imaging results that were available during my care of the patient were reviewed by me and considered in my medical decision making (see chart for details).    Patient with normal LFTs back in 04/2016, will start terbinafine for onychomycosis.  Patient to follow-up with PCP/podiatry for further refills  and lab work needed.  Postop boot provided to prevent further injury to the toe.  Information on podiatry provided.  Return precautions given.  Patient expresses understanding and agrees to plan.  Final Clinical Impressions(s) / UC Diagnoses   Final diagnoses:  Onychomycosis    ED Discharge Orders        Ordered    terbinafine (LAMISIL) 250 MG tablet  Daily     01/21/17 1242        Belinda Fisher, PA-C 01/21/17 1312

## 2017-01-21 NOTE — Discharge Instructions (Signed)
Nail deformity is likely due to fungal infection. Start terbinafine as directed. You may need a longer treatment than what I am writing you for, please follow up with PCP or podiatry for reevaluation and further refills. You may need repeat lab work on this medication as well, so please let your PCP know you have been started on this medication.

## 2017-02-14 ENCOUNTER — Other Ambulatory Visit: Payer: Self-pay | Admitting: Nurse Practitioner

## 2017-02-14 DIAGNOSIS — I10 Essential (primary) hypertension: Secondary | ICD-10-CM

## 2017-02-21 ENCOUNTER — Ambulatory Visit (INDEPENDENT_AMBULATORY_CARE_PROVIDER_SITE_OTHER): Payer: BLUE CROSS/BLUE SHIELD | Admitting: Nurse Practitioner

## 2017-02-21 ENCOUNTER — Encounter: Payer: Self-pay | Admitting: Nurse Practitioner

## 2017-02-21 ENCOUNTER — Other Ambulatory Visit (INDEPENDENT_AMBULATORY_CARE_PROVIDER_SITE_OTHER): Payer: BLUE CROSS/BLUE SHIELD

## 2017-02-21 VITALS — BP 120/78 | HR 74 | Temp 98.9°F | Resp 16 | Ht 64.0 in | Wt 198.0 lb

## 2017-02-21 DIAGNOSIS — I10 Essential (primary) hypertension: Secondary | ICD-10-CM

## 2017-02-21 DIAGNOSIS — E669 Obesity, unspecified: Secondary | ICD-10-CM | POA: Insufficient documentation

## 2017-02-21 DIAGNOSIS — Z0001 Encounter for general adult medical examination with abnormal findings: Secondary | ICD-10-CM | POA: Diagnosis not present

## 2017-02-21 DIAGNOSIS — F419 Anxiety disorder, unspecified: Secondary | ICD-10-CM | POA: Diagnosis not present

## 2017-02-21 DIAGNOSIS — F329 Major depressive disorder, single episode, unspecified: Secondary | ICD-10-CM

## 2017-02-21 DIAGNOSIS — F32A Depression, unspecified: Secondary | ICD-10-CM

## 2017-02-21 DIAGNOSIS — Z0000 Encounter for general adult medical examination without abnormal findings: Secondary | ICD-10-CM | POA: Insufficient documentation

## 2017-02-21 LAB — CBC WITH DIFFERENTIAL/PLATELET
BASOS PCT: 0.6 % (ref 0.0–3.0)
Basophils Absolute: 0.1 10*3/uL (ref 0.0–0.1)
EOS ABS: 0.1 10*3/uL (ref 0.0–0.7)
Eosinophils Relative: 0.8 % (ref 0.0–5.0)
HEMATOCRIT: 44.3 % (ref 36.0–46.0)
HEMOGLOBIN: 15.4 g/dL — AB (ref 12.0–15.0)
Lymphocytes Relative: 31.7 % (ref 12.0–46.0)
Lymphs Abs: 3 10*3/uL (ref 0.7–4.0)
MCHC: 34.7 g/dL (ref 30.0–36.0)
MCV: 90.8 fl (ref 78.0–100.0)
MONO ABS: 0.8 10*3/uL (ref 0.1–1.0)
Monocytes Relative: 8.7 % (ref 3.0–12.0)
NEUTROS ABS: 5.4 10*3/uL (ref 1.4–7.7)
Neutrophils Relative %: 58.2 % (ref 43.0–77.0)
PLATELETS: 347 10*3/uL (ref 150.0–400.0)
RBC: 4.88 Mil/uL (ref 3.87–5.11)
RDW: 13.3 % (ref 11.5–15.5)
WBC: 9.4 10*3/uL (ref 4.0–10.5)

## 2017-02-21 LAB — COMPREHENSIVE METABOLIC PANEL
ALT: 27 U/L (ref 0–35)
AST: 20 U/L (ref 0–37)
Albumin: 4.5 g/dL (ref 3.5–5.2)
Alkaline Phosphatase: 75 U/L (ref 39–117)
BUN: 12 mg/dL (ref 6–23)
CHLORIDE: 99 meq/L (ref 96–112)
CO2: 28 meq/L (ref 19–32)
CREATININE: 0.84 mg/dL (ref 0.40–1.20)
Calcium: 9.9 mg/dL (ref 8.4–10.5)
GFR: 98.14 mL/min (ref >60.00)
GLUCOSE: 127 mg/dL — AB (ref 70–99)
POTASSIUM: 3.4 meq/L — AB (ref 3.5–5.1)
SODIUM: 137 meq/L (ref 135–145)
Total Bilirubin: 0.4 mg/dL (ref 0.2–1.2)
Total Protein: 7.9 g/dL (ref 6.0–8.3)

## 2017-02-21 LAB — LIPID PANEL
CHOL/HDL RATIO: 7
CHOLESTEROL: 293 mg/dL — AB (ref 0–200)
HDL: 45 mg/dL (ref >39.00)
LDL CALC: 215 mg/dL — AB (ref 0–99)
NonHDL: 247.77
TRIGLYCERIDES: 162 mg/dL — AB (ref 0.0–149.0)
VLDL: 32.4 mg/dL (ref 0.0–40.0)

## 2017-02-21 LAB — HEMOGLOBIN A1C: HEMOGLOBIN A1C: 6.1 % (ref 4.6–6.5)

## 2017-02-21 LAB — TSH: TSH: 1.37 u[IU]/mL (ref 0.35–4.50)

## 2017-02-21 MED ORDER — SERTRALINE HCL 50 MG PO TABS: 50.0000 mg | ORAL_TABLET | Freq: Every day | ORAL | 1 refills | Status: DC

## 2017-02-21 NOTE — Assessment & Plan Note (Signed)
Stable,continue current medications - Lipid panel; Future - CBC with Differential/Platelet; Future - Comprehensive metabolic panel; Future

## 2017-02-21 NOTE — Patient Instructions (Addendum)
Please head downstairs for lab work.   I have sent a prescription for zoloft 66m tablets to your pharmacy. Please start 1/2 tablet once daily for 1 week and then increase to a full tablet once daily on week two as tolerated.  Some side effects such as nausea, drowsiness and weight gain can occur.  Also rarely people have experienced suicidal thoughts when taking this medication.  Please discontinue the medication and go directly to ED if this occurs.  Id like to see you back in about 1 month to evaluate progress.     please decrease your buspar to 1 tablet twice a day for 2 weeks, then 1 tablet a day for 2 weeks, then stope.  Please return in about a month, so we can see how you are doing on the new medication  I have placed a referral to counseling . Our office will call you to schedule this appointment. You should hear from our office in 7-10 days.  Please let me know if you are unable to schedule an appointment with your GYN for womens health.  Keep up the good work on diet and exercise!!. Remember half of your plate should be veggies, one-fourth carbs, one-fourth meat, and don't eat meat at every meal. Also, remember to stay away from sugary drinks. I'd like for you to start incorporating exercise into your daily schedule. Start at 10 minutes a day, working up to 30 minutes five times a week.   It was good to see you. Thanks for letting me take care of you today :)   Preventive Care 18-39 Years, Female Preventive care refers to lifestyle choices and visits with your health care provider that can promote health and wellness. What does preventive care include?  A yearly physical exam. This is also called an annual well check.  Dental exams once or twice a year.  Routine eye exams. Ask your health care provider how often you should have your eyes checked.  Personal lifestyle choices, including: ? Daily care of your teeth and gums. ? Regular physical activity. ? Eating a healthy  diet. ? Avoiding tobacco and drug use. ? Limiting alcohol use. ? Practicing safe sex. ? Taking vitamin and mineral supplements as recommended by your health care provider. What happens during an annual well check? The services and screenings done by your health care provider during your annual well check will depend on your age, overall health, lifestyle risk factors, and family history of disease. Counseling Your health care provider may ask you questions about your:  Alcohol use.  Tobacco use.  Drug use.  Emotional well-being.  Home and relationship well-being.  Sexual activity.  Eating habits.  Work and work eStatistician  Method of birth control.  Menstrual cycle.  Pregnancy history.  Screening You may have the following tests or measurements:  Height, weight, and BMI.  Diabetes screening. This is done by checking your blood sugar (glucose) after you have not eaten for a while (fasting).  Blood pressure.  Lipid and cholesterol levels. These may be checked every 5 years starting at age 37  Skin check.  Hepatitis C blood test.  Hepatitis B blood test.  Sexually transmitted disease (STD) testing.  BRCA-related cancer screening. This may be done if you have a family history of breast, ovarian, tubal, or peritoneal cancers.  Pelvic exam and Pap test. This may be done every 3 years starting at age 37 Starting at age 554 this may be done every 5 years if you  have a Pap test in combination with an HPV test.  Discuss your test results, treatment options, and if necessary, the need for more tests with your health care provider. Vaccines Your health care provider may recommend certain vaccines, such as:  Influenza vaccine. This is recommended every year.  Tetanus, diphtheria, and acellular pertussis (Tdap, Td) vaccine. You may need a Td booster every 10 years.  Varicella vaccine. You may need this if you have not been vaccinated.  HPV vaccine. If you are 25  or younger, you may need three doses over 6 months.  Measles, mumps, and rubella (MMR) vaccine. You may need at least one dose of MMR. You may also need a second dose.  Pneumococcal 13-valent conjugate (PCV13) vaccine. You may need this if you have certain conditions and were not previously vaccinated.  Pneumococcal polysaccharide (PPSV23) vaccine. You may need one or two doses if you smoke cigarettes or if you have certain conditions.  Meningococcal vaccine. One dose is recommended if you are age 75-21 years and a first-year college student living in a residence hall, or if you have one of several medical conditions. You may also need additional booster doses.  Hepatitis A vaccine. You may need this if you have certain conditions or if you travel or work in places where you may be exposed to hepatitis A.  Hepatitis B vaccine. You may need this if you have certain conditions or if you travel or work in places where you may be exposed to hepatitis B.  Haemophilus influenzae type b (Hib) vaccine. You may need this if you have certain risk factors.  Talk to your health care provider about which screenings and vaccines you need and how often you need them. This information is not intended to replace advice given to you by your health care provider. Make sure you discuss any questions you have with your health care provider. Document Released: 02/27/2001 Document Revised: 09/21/2015 Document Reviewed: 11/02/2014 Elsevier Interactive Patient Education  [employer].

## 2017-02-21 NOTE — Assessment & Plan Note (Signed)
-  USPSTF grade A and B recommendations reviewed with patient; age-appropriate recommendations, preventive care, screening tests, etc discussed and encouraged; healthy living encouraged; see AVS for patient education given to patient -Discussed importance of 150 minutes of physical activity weekly, eat two servings of fish weekly, eat one serving of tree nuts ( cashews, pistachios, pecans, almonds.Marland Kitchen) every other day, eat 6 servings of fruit/vegetables daily and drink plenty of water and avoid sweet beverages.  -Reviewed Health Maintenance:  She plans to call her GYN for PAP and womens health needs-she says she has been going [facility] but is overdue on her PAP-she will let me know if she is unable to schedule.

## 2017-02-21 NOTE — Assessment & Plan Note (Signed)
Actively working on diet and weight loss-she is cooking meals at home with husband, using my fitness pal app. We discussed a healthy diet and exercise plan and she was encouraged to keep up the good work! - Lipid panel; Future - TSH; Future - Hemoglobin A1c; Future

## 2017-02-21 NOTE — Progress Notes (Signed)
Name: Victoria Ryan   MRN: [id number]    DOB: [birth year]   Date:02/21/2017       Progress Note  Subjective  Chief Complaint  Chief Complaint  Patient presents with  . CPE    fasting, wants to get back on xanax    HPI  Patient presents for annual CPE.  Diet: Breakfast: avocado toast, fruit; Lunch-salad, light lunch; Dinner- shrimp, fish, quinoa; Snack- avoids; Drinks- water-1 gallon a day. She has been following a strict diet for about 3-4 weeks to lost weight Exercise: 4 days a week, cardio and treadmill, weights  USPSTF grade A and B recommendations  Depression: She does c/o anxiety and depression related to her job. She does not have thoughts of hurting herself or others. Depression screen [facility] 2/9 02/21/2017 11/23/2016 06/13/2012  Decreased Interest 1 0 0  Down, Depressed, Hopeless 1 0 0  PHQ - 2 Score 2 0 0  Altered sleeping 2 - -  Tired, decreased energy 1 - -  Change in appetite 1 - -  Feeling bad or failure about yourself  1 - -  Trouble concentrating 1 - -  Moving slowly or fidgety/restless 2 - -  Suicidal thoughts 0 - -  PHQ-9 Score 10 - -   GAD 7 : Generalized Anxiety Score 02/21/2017  Nervous, Anxious, on Edge 1  Control/stop worrying 1  Worry too much - different things 2  Trouble relaxing 2  Restless 2  Easily annoyed or irritable 1  Afraid - awful might happen 0  Total GAD 7 Score 9   Hypertension: maintained on amlodipine 10, HCTZ 25 daily. Reports medication compliance without adverse effects. BP Readings from Last 3 Encounters:  02/21/17 120/78  01/21/17 (!) 149/96  12/17/16 126/84   Obesity: Wt Readings from Last 3 Encounters:  02/21/17 198 lb (89.8 kg)  01/21/17 196 lb (88.9 kg)  12/17/16 200 lb (90.7 kg)   BMI Readings from Last 3 Encounters:  02/21/17 33.99 kg/m  01/21/17 33.64 kg/m  12/17/16 34.33 kg/m    Alcohol: occasional social drink Tobacco use: no HIV: declines STD testing and prevention (chl/gon/syphilis): declines Intimate  partner violence: no Incontinence Symptoms: no  Immunizations: declines influenza, TDAP up to date  Advanced Care Planning: A voluntary discussion about advance care planning including the explanation and discussion of advance directives.  Discussed health care proxy and Living will, and the patient DOES NOT  have a living will at present time. If patient does have living will, I have requested they bring this to the clinic to be scanned in to their chart.  Cervical cancer screening: deferred to GYN  Lipids:  Lab Results  Component Value Date   CHOL 245 (H) 06/13/2012   Lab Results  Component Value Date   HDL 60.10 06/13/2012   No results found for: [facility] Lab Results  Component Value Date   TRIG 84.0 06/13/2012   Lab Results  Component Value Date   CHOLHDL 4 06/13/2012   Lab Results  Component Value Date   LDLDIRECT 176.0 06/13/2012    Glucose:  Glucose, Bld  Date Value Ref Range Status  04/24/2016 118 (H) 65 - 99 mg/dL Final  40/98/1191 478 (H) 70 - 99 mg/dL Final  29/56/2130 865 (H) 70 - 99 mg/dL Final   Glucose-Capillary  Date Value Ref Range Status  01/17/2009 90 70 - 99 mg/dL Final    Skin cancer: No concerns  Aspirin: not indicated ECG:not indicated   Patient Active Problem List  Diagnosis Date Noted  . Acute upper respiratory infection 10/25/2016  . Essential hypertension, benign 06/13/2012  . Generalized anxiety disorder 06/13/2012    Past Surgical History:  Procedure Laterality Date  . CESAREAN SECTION      Family History  Problem Relation Age of Onset  . Hypertension Mother   . Heart disease Father   . Hyperlipidemia Father   . Hypertension Father   . Breast cancer Paternal Grandmother   . Cancer Neg Hx     Social History   Socioeconomic History  . Marital status: Married    Spouse name: Not on file  . Number of children: 2  . Years of education: 13  . Highest education level: Not on file  Social Needs  . Financial resource  strain: Not on file  . Food insecurity - worry: Not on file  . Food insecurity - inability: Not on file  . Transportation needs - medical: Not on file  . Transportation needs - non-medical: Not on file  Occupational History  . Occupation: Clinical biochemist Rep  Tobacco Use  . Smoking status: Former Games developer  . Smokeless tobacco: Never Used  Substance and Sexual Activity  . Alcohol use: Yes    Comment: occasionally wine  . Drug use: No  . Sexual activity: Yes    Birth control/protection: IUD  Other Topics Concern  . Not on file  Social History Narrative   Regular exercise-yes   Caffeine Use-yes     Current Outpatient Medications:  .  amLODipine (NORVASC) 10 MG tablet, TAKE 1 TABLET BY MOUTH ONCE DAILY, Disp: 90 tablet, Rfl: 0 .  APPLE CIDER VINEGAR PO, Take 1 capsule by mouth daily., Disp: , Rfl:  .  hydrochlorothiazide (HYDRODIURIL) 25 MG tablet, Take 1 tablet (25 mg total) by mouth daily. Must keep appt w/new provider for future refills, Disp: 90 tablet, Rfl: 0 .  sertraline (ZOLOFT) 50 MG tablet, Take 1 tablet (50 mg total) by mouth daily., Disp: 30 tablet, Rfl: 1  No Known Allergies   ROS  Constitutional: Negative for fever or weight change.  Respiratory: Negative for cough and shortness of breath.   Cardiovascular: Negative for chest pain or palpitations.  Gastrointestinal: Negative for abdominal pain, no bowel changes.  Musculoskeletal: Negative for gait problem or joint swelling.  Skin: Negative for rash.  Neurological: Negative for dizziness or headache.  No other specific complaints in a complete review of systems (except as listed in HPI above).   Objective  Vitals:   02/21/17 0802  BP: 120/78  Pulse: 74  Resp: 16  Temp: 98.9 F (37.2 C)  TempSrc: Oral  SpO2: 98%  Weight: 198 lb (89.8 kg)  Height: 5\' 4"  (1.626 m)    Body mass index is 33.99 kg/m.  Physical Exam Vital signs reviewed. Constitutional: Patient appears well-developed and  well-nourished. No distress.  HENT: Head: Normocephalic and atraumatic. Ears: Bilateral TMs ok, no erythema or effusion; Nose: Nose normal. Mouth/Throat: Oropharynx is clear and moist. No oropharyngeal exudate.  Eyes: Conjunctivae and EOM are normal. Pupils are equal, round, and reactive to light. No scleral icterus.  Neck: Normal range of motion. Neck supple. No JVD present. No thyromegaly present.  Cardiovascular: Normal rate, regular rhythm and normal heart sounds.  No murmur heard. No BLE edema. Pulmonary/Chest: Effort normal and breath sounds normal. No respiratory distress. Abdominal: Soft. Bowel sounds are normal, no distension. There is no tenderness. no masses Breast: Deferred to GYN FEMALE GENITALIA:  Deferred to GYN Musculoskeletal: Normal  range of motion, no joint effusions. No gross deformities Neurological: he is alert and oriented to person, place, and time. No cranial nerve deficit. Coordination, balance, strength, speech and gait are normal.  Skin: Skin is warm and dry. No rash noted. No erythema.  Psychiatric: Patient has a normal mood and affect. behavior is normal. Judgment and thought content normal.   Fall Risk: Fall Risk  11/23/2016 06/13/2012  Falls in the past year? No No   Assessment & Plan RTC in 1 month for zoloft follow up

## 2017-02-21 NOTE — Assessment & Plan Note (Signed)
She stopped taking buspar because it was not helping She is asking for xanax Rx but we discussed the long term risks including addiction and memory loss and she would prefer a safer option She was given lexapro before says she but did not ever take it- she was just afraid of trying a new medication. We discussed SSRIs- risks, dosing and side effects and she will try zoloft. She will also be willing to see psychology for counseling - sertraline (ZOLOFT) 50 MG tablet; Take 1 tablet (50 mg total) by mouth daily.  Dispense: 30 tablet; Refill: 1 - Ambulatory referral to Psychology She will RTC in 1 month for F/U of zoloft

## 2017-02-24 ENCOUNTER — Other Ambulatory Visit: Payer: Self-pay | Admitting: Nurse Practitioner

## 2017-02-26 ENCOUNTER — Other Ambulatory Visit: Payer: Self-pay | Admitting: Nurse Practitioner

## 2017-02-26 DIAGNOSIS — E7800 Pure hypercholesterolemia, unspecified: Secondary | ICD-10-CM

## 2017-02-26 MED ORDER — ATORVASTATIN CALCIUM 20 MG PO TABS: 20.0000 mg | ORAL_TABLET | Freq: Every day | ORAL | 1 refills | Status: DC

## 2017-03-22 ENCOUNTER — Ambulatory Visit (INDEPENDENT_AMBULATORY_CARE_PROVIDER_SITE_OTHER): Payer: BLUE CROSS/BLUE SHIELD | Admitting: Nurse Practitioner

## 2017-03-22 ENCOUNTER — Encounter: Payer: Self-pay | Admitting: Nurse Practitioner

## 2017-03-22 VITALS — BP 132/88 | HR 82 | Temp 98.8°F | Resp 16 | Ht 64.0 in | Wt 196.4 lb

## 2017-03-22 DIAGNOSIS — F32A Depression, unspecified: Secondary | ICD-10-CM

## 2017-03-22 DIAGNOSIS — F419 Anxiety disorder, unspecified: Secondary | ICD-10-CM | POA: Diagnosis not present

## 2017-03-22 DIAGNOSIS — F329 Major depressive disorder, single episode, unspecified: Secondary | ICD-10-CM

## 2017-03-22 DIAGNOSIS — E785 Hyperlipidemia, unspecified: Secondary | ICD-10-CM | POA: Diagnosis not present

## 2017-03-22 MED ORDER — SERTRALINE HCL 100 MG PO TABS: 50.0000 mg | ORAL_TABLET | Freq: Every day | ORAL | 1 refills | Status: DC

## 2017-03-22 NOTE — Patient Instructions (Signed)
Please return for fasting labwork downstairs in about 2 weeks to recheck your cholesterol levels.  I have increased your zoloft to 100mg  daily. I have sent a new prescription for you. Please return in about 1 month, I want to make sure you are doing okay on the new dosage.  It was good to see you today!   Mediterranean Diet A Mediterranean diet refers to food and lifestyle choices that are based on the traditions of countries located on the [place]. This way of eating has been shown to help prevent certain conditions and improve outcomes for people who have chronic diseases, like kidney disease and heart disease. What are tips for following this plan? Lifestyle  Cook and eat meals together with your family, when possible.  Drink enough fluid to keep your urine clear or pale yellow.  Be physically active every day. This includes: ? Aerobic exercise like running or swimming. ? Leisure activities like gardening, walking, or housework.  Get 7-8 hours of sleep each night.  If recommended by your health care provider, drink red wine in moderation. This means 1 glass a day for nonpregnant women and 2 glasses a day for men. A glass of wine equals 5 oz (150 mL). Reading food labels  Check the serving size of packaged foods. For foods such as rice and pasta, the serving size refers to the amount of cooked product, not dry.  Check the total fat in packaged foods. Avoid foods that have saturated fat or trans fats.  Check the ingredients list for added sugars, such as corn syrup. Shopping  At the grocery store, buy most of your food from the areas near the walls of the store. This includes: ? Fresh fruits and vegetables (produce). ? Grains, beans, nuts, and seeds. Some of these may be available in unpackaged forms or large amounts (in bulk). ? Fresh seafood. ? Poultry and eggs. ? Low-fat dairy products.  Buy whole ingredients instead of prepackaged foods.  Buy fresh fruits and  vegetables in-season from local farmers markets.  Buy frozen fruits and vegetables in resealable bags.  If you do not have access to quality fresh seafood, buy precooked frozen shrimp or canned fish, such as tuna, salmon, or sardines.  Buy small amounts of raw or cooked vegetables, salads, or olives from the deli or salad bar at your store.  Stock your pantry so you always have certain foods on hand, such as olive oil, canned tuna, canned tomatoes, rice, pasta, and beans. Cooking  Cook foods with extra-virgin olive oil instead of using butter or other vegetable oils.  Have meat as a side dish, and have vegetables or grains as your main dish. This means having meat in small portions or adding small amounts of meat to foods like pasta or stew.  Use beans or vegetables instead of meat in common dishes like chili or lasagna.  Experiment with different cooking methods. Try roasting or broiling vegetables instead of steaming or sauteing them.  Add frozen vegetables to soups, stews, pasta, or rice.  Add nuts or seeds for added healthy fat at each meal. You can add these to yogurt, salads, or vegetable dishes.  Marinate fish or vegetables using olive oil, lemon juice, garlic, and fresh herbs. Meal planning  Plan to eat 1 vegetarian meal one day each week. Try to work up to 2 vegetarian meals, if possible.  Eat seafood 2 or more times a week.  Have healthy snacks readily available, such as: ? Vegetable sticks  with hummus. ? [place] yogurt. ? Fruit and nut trail mix.  Eat balanced meals throughout the week. This includes: ? Fruit: 2-3 servings a day ? Vegetables: 4-5 servings a day ? Low-fat dairy: 2 servings a day ? Fish, poultry, or lean meat: 1 serving a day ? Beans and legumes: 2 or more servings a week ? Nuts and seeds: 1-2 servings a day ? Whole grains: 6-8 servings a day ? Extra-virgin olive oil: 3-4 servings a day  Limit red meat and sweets to only a few servings a  month What are my food choices?  Mediterranean diet ? Recommended ? Grains: Whole-grain pasta. Brown rice. Bulgar wheat. Polenta. Couscous. Whole-wheat bread. Orpah Cobb. ? Vegetables: Artichokes. Beets. Broccoli. Cabbage. Carrots. Eggplant. Green beans. Chard. Kale. Spinach. Onions. Leeks. Peas. Squash. Tomatoes. Peppers. Radishes. ? Fruits: Apples. Apricots. Avocado. Berries. Bananas. Cherries. Dates. Figs. Grapes. Lemons. Melon. Oranges. Peaches. Plums. Pomegranate. ? Meats and other protein foods: Beans. Almonds. Sunflower seeds. Pine nuts. Peanuts. Cod. Salmon. Scallops. Shrimp. Tuna. Tilapia. Clams. Oysters. Eggs. ? Dairy: Low-fat milk. Cheese. Greek yogurt. ? Beverages: Water. Red wine. Herbal tea. ? Fats and oils: Extra virgin olive oil. Avocado oil. Grape seed oil. ? Sweets and desserts: [place] yogurt with honey. Baked apples. Poached pears. Trail mix. ? Seasoning and other foods: Basil. Cilantro. Coriander. Cumin. Mint. Parsley. Sage. Rosemary. Tarragon. Garlic. Oregano. Thyme. Pepper. Balsalmic vinegar. Tahini. Hummus. Tomato sauce. Olives. Mushrooms. ? Limit these ? Grains: Prepackaged pasta or rice dishes. Prepackaged cereal with added sugar. ? Vegetables: Deep fried potatoes (french fries). ? Fruits: Fruit canned in syrup. ? Meats and other protein foods: Beef. Pork. Lamb. Poultry with skin. Hot dogs. Tomasa Blase. ? Dairy: Ice cream. Sour cream. Whole milk. ? Beverages: Juice. Sugar-sweetened soft drinks. Beer. Liquor and spirits. ? Fats and oils: Butter. Canola oil. Vegetable oil. Beef fat (tallow). Lard. ? Sweets and desserts: Cookies. Cakes. Pies. Candy. ? Seasoning and other foods: Mayonnaise. Premade sauces and marinades. ? The items listed may not be a complete list. Talk with your dietitian about what dietary choices are right for you. Summary  The Mediterranean diet includes both food and lifestyle choices.  Eat a variety of fresh fruits and vegetables, beans, nuts,  seeds, and whole grains.  Limit the amount of red meat and sweets that you eat.  Talk with your health care provider about whether it is safe for you to drink red wine in moderation. This means 1 glass a day for nonpregnant women and 2 glasses a day for men. A glass of wine equals 5 oz (150 mL). This information is not intended to replace advice given to you by your health care provider. Make sure you discuss any questions you have with your health care provider. Document Released: 08/25/2015 Document Revised: 09/27/2015 Document Reviewed: 08/25/2015 Elsevier Interactive Patient Education  [employer].

## 2017-03-22 NOTE — Progress Notes (Signed)
Name: Victoria Ryan   MRN: [id number]    DOB: April 08, 1980   Date:03/22/2017       Progress Note  Subjective  Chief Complaint  Chief Complaint  Patient presents with  . Follow-up    anxiety    HPI She is here for anxiety follow up. We will also discuss her hyperlipidemia.  Anxiety- zoloft 50 daily started at last visit  She says she has been taking the medication daily without any adverse effects and she does believe the medication has improved her mood. She does still have some daily worry but is able to get through it She was referred to counseling but says she does not feel like she needs to go, she would like to see if we can increase her zoloft dosage She denies restlessness, insomnia, loss of appetite, hallucinations, thoughts of hurting herself or others  Cholesterol - recently started on atorvastatin 20 daily due to abnormal lipid panel at her CPE on 2/7 Reports she has been taking the atorvastatin daily and has not noted any adverse side effects including myalgias She has been working on eating a healthy diet at home.  Lab Results  Component Value Date   CHOL 293 (H) 02/21/2017   HDL 45.00 02/21/2017   LDLCALC 215 (H) 02/21/2017   LDLDIRECT 176.0 06/13/2012   TRIG 162.0 (H) 02/21/2017   CHOLHDL 7 02/21/2017     Patient Active Problem List   Diagnosis Date Noted  . Obesity (BMI 30-39.9) 02/21/2017  . Encounter for general adult medical examination with abnormal findings 02/21/2017  . Acute upper respiratory infection 10/25/2016  . Essential hypertension, benign 06/13/2012  . Anxiety and depression 06/13/2012    Past Surgical History:  Procedure Laterality Date  . CESAREAN SECTION      Family History  Problem Relation Age of Onset  . Hypertension Mother   . Heart disease Father   . Hyperlipidemia Father   . Hypertension Father   . Breast cancer Paternal Grandmother   . Cancer Neg Hx     Social History   Socioeconomic History  . Marital status:  Married    Spouse name: Not on file  . Number of children: 2  . Years of education: 14  . Highest education level: Not on file  Social Needs  . Financial resource strain: Not on file  . Food insecurity - worry: Not on file  . Food insecurity - inability: Not on file  . Transportation needs - medical: Not on file  . Transportation needs - non-medical: Not on file  Occupational History  . Occupation: Clinical biochemist Rep  Tobacco Use  . Smoking status: Former Games developer  . Smokeless tobacco: Never Used  Substance and Sexual Activity  . Alcohol use: Yes    Comment: occasionally wine  . Drug use: No  . Sexual activity: Yes    Birth control/protection: IUD  Other Topics Concern  . Not on file  Social History Narrative   Regular exercise-yes   Caffeine Use-yes     Current Outpatient Medications:  .  amLODipine (NORVASC) 10 MG tablet, TAKE 1 TABLET BY MOUTH ONCE DAILY, Disp: 90 tablet, Rfl: 0 .  APPLE CIDER VINEGAR PO, Take 1 capsule by mouth daily., Disp: , Rfl:  .  atorvastatin (LIPITOR) 20 MG tablet, Take 1 tablet (20 mg total) by mouth daily., Disp: 30 tablet, Rfl: 1 .  hydrochlorothiazide (HYDRODIURIL) 25 MG tablet, Take 1 tablet (25 mg total) by mouth daily. Must keep appt w/new provider  for future refills, Disp: 90 tablet, Rfl: 0 .  sertraline (ZOLOFT) 50 MG tablet, Take 1 tablet (50 mg total) by mouth daily., Disp: 30 tablet, Rfl: 1  No Known Allergies   ROS See HPI  Objective  Vitals:   03/22/17 1058  BP: 132/88  Pulse: 82  Resp: 16  Temp: 98.8 F (37.1 C)  TempSrc: Oral  SpO2: 98%  Weight: 196 lb 6.4 oz (89.1 kg)  Height: 5\' 4"  (1.626 m)    Body mass index is 33.71 kg/m.  Physical Exam Vital signs reviewed. Constitutional: Patient appears well-developed and well-nourished. No distress.  HENT: Head: Normocephalic and atraumatic. Nose: Nose normal. Mouth/Throat: Oropharynx is clear and moist.  Eyes: Conjunctivae and EOM are normal. No scleral icterus.   Neck: Normal range of motion. Neck supple.  Cardiovascular: Normal rate, regular rhythm and normal heart sounds.  No murmur heard. No BLE edema. Pulmonary/Chest: Effort normal and breath sounds normal. No respiratory distress. Musculoskeletal: Normal range of motion, no joint effusions. No gross deformities Neurological: She is alert and oriented to person, place, and time. Coordination, balance, strength, speech and gait are normal.  Skin: Skin is warm and dry. No rash noted. No erythema.  Psychiatric: Patient has a normal mood and affect. behavior is normal. Judgment and thought content normal.   PHQ2/9: Depression screen [facility] 2/9 02/21/2017 11/23/2016 06/13/2012  Decreased Interest 1 0 0  Down, Depressed, Hopeless 1 0 0  PHQ - 2 Score 2 0 0  Altered sleeping 2 - -  Tired, decreased energy 1 - -  Change in appetite 1 - -  Feeling bad or failure about yourself  1 - -  Trouble concentrating 1 - -  Moving slowly or fidgety/restless 2 - -  Suicidal thoughts 0 - -  PHQ-9 Score 10 - -   Assessment & Plan RTC 1 month for FU of zoloft increase

## 2017-03-24 ENCOUNTER — Encounter: Payer: Self-pay | Admitting: Nurse Practitioner

## 2017-03-24 DIAGNOSIS — E785 Hyperlipidemia, unspecified: Secondary | ICD-10-CM | POA: Insufficient documentation

## 2017-03-24 NOTE — Assessment & Plan Note (Signed)
Increase zoloft to 100 daily RTC in 1 month for follow up - sertraline (ZOLOFT) 100 MG tablet; Take 0.5 tablets (50 mg total) by mouth daily.  Dispense: 30 tablet; Refill: 1

## 2017-03-24 NOTE — Assessment & Plan Note (Signed)
Stable on atorvastatin, continue Instructed to come for labs in about 2 weeks to recheck lipids, CMET  Discussed the role of healthy diet and exercise in the management of high cholesterol-See AVS for education provided to patient - Lipid panel; Future - Comprehensive metabolic panel; Future

## 2017-04-20 ENCOUNTER — Other Ambulatory Visit: Payer: Self-pay | Admitting: Nurse Practitioner

## 2017-04-20 DIAGNOSIS — I10 Essential (primary) hypertension: Secondary | ICD-10-CM

## 2017-04-22 ENCOUNTER — Other Ambulatory Visit: Payer: Self-pay | Admitting: Nurse Practitioner

## 2017-04-22 DIAGNOSIS — I10 Essential (primary) hypertension: Secondary | ICD-10-CM

## 2017-04-22 MED ORDER — HYDROCHLOROTHIAZIDE 25 MG PO TABS: 25.0000 mg | ORAL_TABLET | Freq: Every day | ORAL | 0 refills | Status: DC

## 2017-04-22 NOTE — Telephone Encounter (Signed)
Copied from CRM [id number]. Topic: Quick Communication - Rx Refill/Question >> Apr 22, 2017  3:07 PM Laural Benes, [place] C wrote: Medication: hydrochlorothiazide (HYDRODIURIL) 25 MG tablet    Has the patient contacted their pharmacy? Yes   (Agent: If no, request that the patient contact the pharmacy for the refill.)  Preferred Pharmacy (with phone number or street name): [facility] [address] [place], [place] - [address]  Agent: Please be advised that RX refills may take up to 3 business days. We ask that you follow-up with your pharmacy.

## 2017-04-22 NOTE — Telephone Encounter (Signed)
Left VM re: refill: needs appt prior to refill.

## 2017-05-15 ENCOUNTER — Ambulatory Visit: Payer: BLUE CROSS/BLUE SHIELD | Admitting: Nurse Practitioner

## 2017-05-17 ENCOUNTER — Other Ambulatory Visit: Payer: Self-pay | Admitting: Nurse Practitioner

## 2017-05-17 DIAGNOSIS — I10 Essential (primary) hypertension: Secondary | ICD-10-CM

## 2017-05-21 ENCOUNTER — Encounter: Payer: Self-pay | Admitting: Nurse Practitioner

## 2017-05-21 ENCOUNTER — Ambulatory Visit (INDEPENDENT_AMBULATORY_CARE_PROVIDER_SITE_OTHER): Payer: BLUE CROSS/BLUE SHIELD | Admitting: Nurse Practitioner

## 2017-05-21 VITALS — BP 118/80 | HR 84 | Temp 99.2°F | Resp 16 | Ht 64.0 in | Wt 196.1 lb

## 2017-05-21 DIAGNOSIS — F329 Major depressive disorder, single episode, unspecified: Secondary | ICD-10-CM

## 2017-05-21 DIAGNOSIS — F419 Anxiety disorder, unspecified: Secondary | ICD-10-CM | POA: Diagnosis not present

## 2017-05-21 DIAGNOSIS — E785 Hyperlipidemia, unspecified: Secondary | ICD-10-CM | POA: Diagnosis not present

## 2017-05-21 DIAGNOSIS — F32A Depression, unspecified: Secondary | ICD-10-CM

## 2017-05-21 DIAGNOSIS — R7303 Prediabetes: Secondary | ICD-10-CM

## 2017-05-21 DIAGNOSIS — E119 Type 2 diabetes mellitus without complications: Secondary | ICD-10-CM | POA: Insufficient documentation

## 2017-05-21 MED ORDER — SERTRALINE HCL 100 MG PO TABS: 50.0000 mg | ORAL_TABLET | Freq: Every day | ORAL | 1 refills | Status: DC

## 2017-05-21 NOTE — Patient Instructions (Signed)
Please return in about 3 months for a recheck of your cholesterol and pre-diabetes  Please work on healthy diet and exercise. Remember half of your plate should be veggies, one-fourth carbs, one-fourth meat, and don't eat meat at every meal. Also, remember to stay away from sugary drinks. I'd like for you to start incorporating exercise into your daily schedule. Start at 10 minutes a day, working up to 30 minutes five times a week.   It was good to see you. Thanks for letting me take care of you today :)   Mediterranean Diet A Mediterranean diet refers to food and lifestyle choices that are based on the traditions of countries located on the [place]. This way of eating has been shown to help prevent certain conditions and improve outcomes for people who have chronic diseases, like kidney disease and heart disease. What are tips for following this plan? Lifestyle  Cook and eat meals together with your family, when possible.  Drink enough fluid to keep your urine clear or pale yellow.  Be physically active every day. This includes: ? Aerobic exercise like running or swimming. ? Leisure activities like gardening, walking, or housework.  Get 7-8 hours of sleep each night.  If recommended by your health care provider, drink red wine in moderation. This means 1 glass a day for nonpregnant women and 2 glasses a day for men. A glass of wine equals 5 oz (150 mL). Reading food labels  Check the serving size of packaged foods. For foods such as rice and pasta, the serving size refers to the amount of cooked product, not dry.  Check the total fat in packaged foods. Avoid foods that have saturated fat or trans fats.  Check the ingredients list for added sugars, such as corn syrup. Shopping  At the grocery store, buy most of your food from the areas near the walls of the store. This includes: ? Fresh fruits and vegetables (produce). ? Grains, beans, nuts, and seeds. Some of these may be  available in unpackaged forms or large amounts (in bulk). ? Fresh seafood. ? Poultry and eggs. ? Low-fat dairy products.  Buy whole ingredients instead of prepackaged foods.  Buy fresh fruits and vegetables in-season from local farmers markets.  Buy frozen fruits and vegetables in resealable bags.  If you do not have access to quality fresh seafood, buy precooked frozen shrimp or canned fish, such as tuna, salmon, or sardines.  Buy small amounts of raw or cooked vegetables, salads, or olives from the deli or salad bar at your store.  Stock your pantry so you always have certain foods on hand, such as olive oil, canned tuna, canned tomatoes, rice, pasta, and beans. Cooking  Cook foods with extra-virgin olive oil instead of using butter or other vegetable oils.  Have meat as a side dish, and have vegetables or grains as your main dish. This means having meat in small portions or adding small amounts of meat to foods like pasta or stew.  Use beans or vegetables instead of meat in common dishes like chili or lasagna.  Experiment with different cooking methods. Try roasting or broiling vegetables instead of steaming or sauteing them.  Add frozen vegetables to soups, stews, pasta, or rice.  Add nuts or seeds for added healthy fat at each meal. You can add these to yogurt, salads, or vegetable dishes.  Marinate fish or vegetables using olive oil, lemon juice, garlic, and fresh herbs. Meal planning  Plan to eat 1 vegetarian  meal one day each week. Try to work up to 2 vegetarian meals, if possible.  Eat seafood 2 or more times a week.  Have healthy snacks readily available, such as: ? Vegetable sticks with hummus. ? [place] yogurt. ? Fruit and nut trail mix.  Eat balanced meals throughout the week. This includes: ? Fruit: 2-3 servings a day ? Vegetables: 4-5 servings a day ? Low-fat dairy: 2 servings a day ? Fish, poultry, or lean meat: 1 serving a day ? Beans and legumes: 2 or  more servings a week ? Nuts and seeds: 1-2 servings a day ? Whole grains: 6-8 servings a day ? Extra-virgin olive oil: 3-4 servings a day  Limit red meat and sweets to only a few servings a month What are my food choices?  Mediterranean diet ? Recommended ? Grains: Whole-grain pasta. Brown rice. Bulgar wheat. Polenta. Couscous. Whole-wheat bread. Orpah Cobb. ? Vegetables: Artichokes. Beets. Broccoli. Cabbage. Carrots. Eggplant. Green beans. Chard. Kale. Spinach. Onions. Leeks. Peas. Squash. Tomatoes. Peppers. Radishes. ? Fruits: Apples. Apricots. Avocado. Berries. Bananas. Cherries. Dates. Figs. Grapes. Lemons. Melon. Oranges. Peaches. Plums. Pomegranate. ? Meats and other protein foods: Beans. Almonds. Sunflower seeds. Pine nuts. Peanuts. Cod. Salmon. Scallops. Shrimp. Tuna. Tilapia. Clams. Oysters. Eggs. ? Dairy: Low-fat milk. Cheese. Greek yogurt. ? Beverages: Water. Red wine. Herbal tea. ? Fats and oils: Extra virgin olive oil. Avocado oil. Grape seed oil. ? Sweets and desserts: [place] yogurt with honey. Baked apples. Poached pears. Trail mix. ? Seasoning and other foods: Basil. Cilantro. Coriander. Cumin. Mint. Parsley. Sage. Rosemary. Tarragon. Garlic. Oregano. Thyme. Pepper. Balsalmic vinegar. Tahini. Hummus. Tomato sauce. Olives. Mushrooms. ? Limit these ? Grains: Prepackaged pasta or rice dishes. Prepackaged cereal with added sugar. ? Vegetables: Deep fried potatoes (french fries). ? Fruits: Fruit canned in syrup. ? Meats and other protein foods: Beef. Pork. Lamb. Poultry with skin. Hot dogs. Tomasa Blase. ? Dairy: Ice cream. Sour cream. Whole milk. ? Beverages: Juice. Sugar-sweetened soft drinks. Beer. Liquor and spirits. ? Fats and oils: Butter. Canola oil. Vegetable oil. Beef fat (tallow). Lard. ? Sweets and desserts: Cookies. Cakes. Pies. Candy. ? Seasoning and other foods: Mayonnaise. Premade sauces and marinades. ? The items listed may not be a complete list. Talk with your  dietitian about what dietary choices are right for you. Summary  The Mediterranean diet includes both food and lifestyle choices.  Eat a variety of fresh fruits and vegetables, beans, nuts, seeds, and whole grains.  Limit the amount of red meat and sweets that you eat.  Talk with your health care provider about whether it is safe for you to drink red wine in moderation. This means 1 glass a day for nonpregnant women and 2 glasses a day for men. A glass of wine equals 5 oz (150 mL). This information is not intended to replace advice given to you by your health care provider. Make sure you discuss any questions you have with your health care provider. Document Released: 08/25/2015 Document Revised: 09/27/2015 Document Reviewed: 08/25/2015 Elsevier Interactive Patient Education  [employer].

## 2017-05-21 NOTE — Progress Notes (Signed)
Name: Victoria Ryan   MRN: [id number]    DOB: 1980/02/28   Date:05/21/2017       Progress Note  Subjective  Chief Complaint  Chief Complaint  Patient presents with  . Follow-up    anxiety and depression, states she is doing good    HPI  Anxiety- Zoloft dosage was increased to 100 daily at last OV on 3/8. She reports daily medication compliance and has not noted any adverse medication effects. She says she feels really good on the new dosage of zoloft, her mood Is better, her job is better and her anxiety and depression are greatly improved. She denies restlessness, insomnia, loss of appetite, hallucinations, thoughts of hurting herself or others  Cholesterol- She was started on atorvastatin 20 daily due to abnormal lipid panel at her CPE on 2/7, she took the atorvastatin for 30 days then stopped because she is now thinking of becoming pregnant. She has been working on eating a healthy diet at home- more salads, less cheeses and processed foods, but admits she is not following routine exercise schedule.  Patient Active Problem List   Diagnosis Date Noted  . Hyperlipidemia 03/24/2017  . Obesity (BMI 30-39.9) 02/21/2017  . Encounter for general adult medical examination with abnormal findings 02/21/2017  . Acute upper respiratory infection 10/25/2016  . Essential hypertension, benign 06/13/2012  . Anxiety and depression 06/13/2012    Past Surgical History:  Procedure Laterality Date  . CESAREAN SECTION      Family History  Problem Relation Age of Onset  . Hypertension Mother   . Heart disease Father   . Hyperlipidemia Father   . Hypertension Father   . Breast cancer Paternal Grandmother   . Cancer Neg Hx     Social History   Socioeconomic History  . Marital status: Married    Spouse name: Not on file  . Number of children: 2  . Years of education: 60  . Highest education level: Not on file  Occupational History  . Occupation: Clinical biochemist Rep  Social  Needs  . Financial resource strain: Not on file  . Food insecurity:    Worry: Not on file    Inability: Not on file  . Transportation needs:    Medical: Not on file    Non-medical: Not on file  Tobacco Use  . Smoking status: Former Games developer  . Smokeless tobacco: Never Used  Substance and Sexual Activity  . Alcohol use: Yes    Comment: occasionally wine  . Drug use: No  . Sexual activity: Yes    Birth control/protection: IUD  Lifestyle  . Physical activity:    Days per week: Not on file    Minutes per session: Not on file  . Stress: Not on file  Relationships  . Social connections:    Talks on phone: Not on file    Gets together: Not on file    Attends religious service: Not on file    Active member of club or organization: Not on file    Attends meetings of clubs or organizations: Not on file    Relationship status: Not on file  . Intimate partner violence:    Fear of current or ex partner: Not on file    Emotionally abused: Not on file    Physically abused: Not on file    Forced sexual activity: Not on file  Other Topics Concern  . Not on file  Social History Narrative   Regular exercise-yes   Caffeine  Use-yes     Current Outpatient Medications:  .  amLODipine (NORVASC) 10 MG tablet, TAKE 1 TABLET BY MOUTH ONCE DAILY, Disp: 90 tablet, Rfl: 0 .  APPLE CIDER VINEGAR PO, Take 1 capsule by mouth daily., Disp: , Rfl:  .  hydrochlorothiazide (HYDRODIURIL) 25 MG tablet, Take 1 tablet (25 mg total) by mouth daily., Disp: 90 tablet, Rfl: 1 .  sertraline (ZOLOFT) 100 MG tablet, Take 0.5 tablets (50 mg total) by mouth daily., Disp: 30 tablet, Rfl: 1 .  atorvastatin (LIPITOR) 20 MG tablet, Take 1 tablet (20 mg total) by mouth daily. (Patient not taking: Reported on 05/21/2017), Disp: 30 tablet, Rfl: 1  No Known Allergies   ROS See HPI  Objective  Vitals:   05/21/17 0957  BP: 118/80  Pulse: 84  Resp: 16  Temp: 99.2 F (37.3 C)  TempSrc: Oral  SpO2: 98%  Weight: 196  lb 1.9 oz (89 kg)  Height: 5\' 4"  (1.626 m)    Body mass index is 33.66 kg/m.  Physical Exam Vital signs reviewed. Constitutional: Patient appears well-developed and well-nourished. No distress.  HENT: Head: Normocephalic and atraumatic. Nose: Nose normal. Mouth/Throat: Oropharynx is clear and moist.  Eyes: Conjunctivae and EOM are normal. No scleral icterus.  Neck: Normal range of motion. Neck supple.  Cardiovascular: Normal rate, regular rhythm and normal heart sounds. No murmur heard.Distal pulses intact. Pulmonary/Chest: Effort normal and breath sounds normal. No respiratory distress. Neurological: She is alert and oriented to person, place, and time. Coordination, balance, strength, speech and gait are normal.  Skin: Skin is warm and dry. No rash noted. No erythema.  Psychiatric: Patient has a normal mood and affect. behavior is normal. Judgment and thought content normal.  Assessment & Plan RTC in 3 months for F/U of pre-diabetes, check A1c; hyperlipidemia, check lipid panel

## 2017-05-21 NOTE — Assessment & Plan Note (Signed)
She is actively trying to become pregnant and has discontinued her statin, we will hold off on treating cholesterol with medication at this time due to risks of medication during pregnancy We Discussed the role of healthy diet and exercise in the management of hyperlipidemia and printed information on AVS RTC in about 3 months for repeat lipid panel

## 2017-05-21 NOTE — Assessment & Plan Note (Signed)
Stable, continue zoloft F/U for new or worsening symptoms - sertraline (ZOLOFT) 100 MG tablet; Take 0.5 tablets (50 mg total) by mouth daily.  Dispense: 30 tablet; Refill: 1

## 2017-05-21 NOTE — Assessment & Plan Note (Signed)
Discussed the role of healthy diet and exercise in the management of pre-diabetes and printed information on AVS RTC in about 3 months for repeat A1c

## 2017-07-15 DIAGNOSIS — Z1389 Encounter for screening for other disorder: Secondary | ICD-10-CM | POA: Diagnosis not present

## 2017-07-15 DIAGNOSIS — Z01419 Encounter for gynecological examination (general) (routine) without abnormal findings: Secondary | ICD-10-CM | POA: Diagnosis not present

## 2017-07-15 DIAGNOSIS — Z6834 Body mass index (BMI) 34.0-34.9, adult: Secondary | ICD-10-CM | POA: Diagnosis not present

## 2017-07-15 DIAGNOSIS — Z124 Encounter for screening for malignant neoplasm of cervix: Secondary | ICD-10-CM | POA: Diagnosis not present

## 2017-08-02 DIAGNOSIS — N871 Moderate cervical dysplasia: Secondary | ICD-10-CM | POA: Diagnosis not present

## 2017-08-02 DIAGNOSIS — R87619 Unspecified abnormal cytological findings in specimens from cervix uteri: Secondary | ICD-10-CM | POA: Diagnosis not present

## 2017-08-02 DIAGNOSIS — Z3202 Encounter for pregnancy test, result negative: Secondary | ICD-10-CM | POA: Diagnosis not present

## 2017-08-02 DIAGNOSIS — Z30432 Encounter for removal of intrauterine contraceptive device: Secondary | ICD-10-CM | POA: Diagnosis not present

## 2017-08-18 ENCOUNTER — Other Ambulatory Visit: Payer: Self-pay | Admitting: Nurse Practitioner

## 2017-08-18 DIAGNOSIS — I10 Essential (primary) hypertension: Secondary | ICD-10-CM

## 2017-08-22 ENCOUNTER — Encounter: Payer: Self-pay | Admitting: Nurse Practitioner

## 2017-08-22 ENCOUNTER — Ambulatory Visit (INDEPENDENT_AMBULATORY_CARE_PROVIDER_SITE_OTHER): Payer: BLUE CROSS/BLUE SHIELD | Admitting: Nurse Practitioner

## 2017-08-22 ENCOUNTER — Other Ambulatory Visit (INDEPENDENT_AMBULATORY_CARE_PROVIDER_SITE_OTHER): Payer: BLUE CROSS/BLUE SHIELD

## 2017-08-22 VITALS — BP 122/80 | HR 76 | Temp 98.1°F | Resp 16 | Ht 64.0 in | Wt 202.0 lb

## 2017-08-22 DIAGNOSIS — E785 Hyperlipidemia, unspecified: Secondary | ICD-10-CM | POA: Diagnosis not present

## 2017-08-22 DIAGNOSIS — R7303 Prediabetes: Secondary | ICD-10-CM

## 2017-08-22 LAB — LIPID PANEL
CHOL/HDL RATIO: 6
Cholesterol: 273 mg/dL — ABNORMAL HIGH (ref 0–200)
HDL: 44.6 mg/dL (ref >39.00)
NonHDL: 228.43
Triglycerides: 203 mg/dL — ABNORMAL HIGH (ref 0.0–149.0)
VLDL: 40.6 mg/dL — AB (ref 0.0–40.0)

## 2017-08-22 LAB — LDL CHOLESTEROL, DIRECT: Direct LDL: 201 mg/dL

## 2017-08-22 LAB — BASIC METABOLIC PANEL
BUN: 16 mg/dL (ref 6–23)
CHLORIDE: 101 meq/L (ref 96–112)
CO2: 27 meq/L (ref 19–32)
CREATININE: 0.92 mg/dL (ref 0.40–1.20)
Calcium: 9.2 mg/dL (ref 8.4–10.5)
GFR: 88.12 mL/min (ref >60.00)
Glucose, Bld: 106 mg/dL — ABNORMAL HIGH (ref 70–99)
Potassium: 3.4 mEq/L — ABNORMAL LOW (ref 3.5–5.1)
Sodium: 137 mEq/L (ref 135–145)

## 2017-08-22 LAB — HEMOGLOBIN A1C: Hgb A1c MFr Bld: 6.2 % (ref 4.6–6.5)

## 2017-08-22 NOTE — Assessment & Plan Note (Signed)
Update labs-she is fasting today F/U with further recommendations pending lab results Discussed the role of healthy diet and exercise in the management of chronic conditions including HLD, DM, and additional information was provided on AVS - Lipid panel; Future

## 2017-08-22 NOTE — Progress Notes (Signed)
Name: Daley Mooradian Browe   MRN: [id number]    DOB: 27-Sep-1980   Date:08/22/2017       Progress Note  Subjective  Chief Complaint  Chief Complaint  Patient presents with  . Follow-up    A1c and lipid panel check    HPI Ms Jaworski is here today for follow up of pre-diabetes and cholesterol. She was started on atorvastatin in February due to HLD noted on 2/7 CPE, but she stopped taking it after about 30 days due to actively trying to become pregnant. At her last OV on 05/21/17 we discussed healthy diet and exercise with plan to recheck cholesterol and a1c in about 3 months, as her CPE labs on 2/7 also showed pre-diabetes. She tells me today that she has not really been exercising, just hard to find the time to exercise. She has been trying to watch her diet, eating more vegetables and salads. She has been following with GYN with recent routine pap and IUD removal in July.  Wt Readings from Last 3 Encounters:  08/22/17 202 lb (91.6 kg)  05/21/17 196 lb 1.9 oz (89 kg)  03/22/17 196 lb 6.4 oz (89.1 kg)     Patient Active Problem List   Diagnosis Date Noted  . Pre-diabetes 05/21/2017  . Hyperlipidemia 03/24/2017  . Obesity (BMI 30-39.9) 02/21/2017  . Encounter for general adult medical examination with abnormal findings 02/21/2017  . Acute upper respiratory infection 10/25/2016  . Essential hypertension, benign 06/13/2012  . Anxiety and depression 06/13/2012    Past Surgical History:  Procedure Laterality Date  . CESAREAN SECTION      Family History  Problem Relation Age of Onset  . Hypertension Mother   . Heart disease Father   . Hyperlipidemia Father   . Hypertension Father   . Breast cancer Paternal Grandmother   . Cancer Neg Hx     Social History   Socioeconomic History  . Marital status: Married    Spouse name: Not on file  . Number of children: 2  . Years of education: 78  . Highest education level: Not on file  Occupational History  . Occupation: Clinical biochemist  Rep  Social Needs  . Financial resource strain: Not on file  . Food insecurity:    Worry: Not on file    Inability: Not on file  . Transportation needs:    Medical: Not on file    Non-medical: Not on file  Tobacco Use  . Smoking status: Former Games developer  . Smokeless tobacco: Never Used  Substance and Sexual Activity  . Alcohol use: Yes    Comment: occasionally wine  . Drug use: No  . Sexual activity: Yes    Birth control/protection: IUD  Lifestyle  . Physical activity:    Days per week: Not on file    Minutes per session: Not on file  . Stress: Not on file  Relationships  . Social connections:    Talks on phone: Not on file    Gets together: Not on file    Attends religious service: Not on file    Active member of club or organization: Not on file    Attends meetings of clubs or organizations: Not on file    Relationship status: Not on file  . Intimate partner violence:    Fear of current or ex partner: Not on file    Emotionally abused: Not on file    Physically abused: Not on file    Forced sexual activity: Not  on file  Other Topics Concern  . Not on file  Social History Narrative   Regular exercise-yes   Caffeine Use-yes     Current Outpatient Medications:  .  amLODipine (NORVASC) 10 MG tablet, TAKE 1 TABLET BY MOUTH ONCE DAILY, Disp: 90 tablet, Rfl: 0 .  APPLE CIDER VINEGAR PO, Take 1 capsule by mouth daily., Disp: , Rfl:  .  hydrochlorothiazide (HYDRODIURIL) 25 MG tablet, Take 1 tablet (25 mg total) by mouth daily., Disp: 90 tablet, Rfl: 1 .  sertraline (ZOLOFT) 100 MG tablet, Take 0.5 tablets (50 mg total) by mouth daily., Disp: 30 tablet, Rfl: 1  No Known Allergies   ROS See HPI  Objective  Vitals:   08/22/17 0858  BP: 122/80  Pulse: 76  Resp: 16  Temp: 98.1 F (36.7 C)  TempSrc: Oral  SpO2: 97%  Weight: 202 lb (91.6 kg)  Height: 5\' 4"  (1.626 m)    Body mass index is 34.67 kg/m.  Physical Exam Vital signs reviewed. Constitutional:  Patient appears well-developed and well-nourished. No distress.  HENT: Head: Normocephalic and atraumatic. Nose: Nose normal. Mouth/Throat: Oropharynx is clear and moist.  Eyes: Conjunctivae and EOM are normal. No scleral icterus.  Neck: Normal range of motion. Neck supple.  Cardiovascular: Normal rate, regular rhythm and normal heart sounds. No murmur heard.Distal pulses intact. Pulmonary/Chest: Effort normal and breath sounds normal. No respiratory distress. Neurological:She is alert and oriented to person, place, and time. Coordination, balance, strength, speech and gait are normal.  Skin: Skin is warm and dry. No rash noted. No erythema.  Psychiatric: Patient has a normal mood and affect. behavior is normal. Judgment and thought content normal.  Assessment & Plan F/U TBD pending lab results-Will consider referral to nutrition education depending on lab results today  -Reviewed Health Maintenance: PAP records request sent to GYN Dr. Mindi Slicker

## 2017-08-22 NOTE — Assessment & Plan Note (Addendum)
Update labs  F/U with further recommendations pending lab results Discussed the role of healthy diet and exercise in the management of chronic conditions including HLD, DM, and additional information was provided on AVS - Hemoglobin A1c; Future - Basic metabolic panel; Future

## 2017-08-22 NOTE — Patient Instructions (Signed)
Please head downstairs for lab work. If any of your test results are critically abnormal, you will be contacted right away. Otherwise, I will contact you within a week with further plan of care.  It was nice to see you today. Thanks for letting me take care of you.   Cholesterol Cholesterol is a fat. Your body needs a small amount of cholesterol. Cholesterol (plaque) may build up in your blood vessels (arteries). That makes you more likely to have a heart attack or stroke. You cannot feel your cholesterol level. Having a blood test is the only way to find out if your level is high. Keep your test results. Work with your doctor to keep your cholesterol at a good level. What do the results mean?  Total cholesterol is how much cholesterol is in your blood.  LDL is bad cholesterol. This is the type that can build up. Try to have low LDL.  HDL is good cholesterol. It cleans your blood vessels and carries LDL away. Try to have high HDL.  Triglycerides are fat that the body can store or burn for energy. What are good levels of cholesterol?  Total cholesterol below 200.  LDL below 100 is good for people who have health risks. LDL below 70 is good for people who have very high risks.  HDL above 40 is good. It is best to have HDL of 60 or higher.  Triglycerides below 150. How can I lower my cholesterol? Diet Follow your diet program as told by your doctor.  Choose fish, white meat chicken, or [place] that is roasted or baked. Try not to eat red meat, fried foods, sausage, or lunch meats.  Eat lots of fresh fruits and vegetables.  Choose whole grains, beans, pasta, potatoes, and cereals.  Choose olive oil, corn oil, or canola oil. Only use small amounts.  Try not to eat butter, mayonnaise, shortening, or palm kernel oils.  Try not to eat foods with trans fats.  Choose low-fat or nonfat dairy foods. ? Drink skim or nonfat milk. ? Eat low-fat or nonfat yogurt and cheeses. ? Try not  to drink whole milk or cream. ? Try not to eat ice cream, egg yolks, or full-fat cheeses.  Healthy desserts include angel food cake, ginger snaps, animal crackers, hard candy, popsicles, and low-fat or nonfat frozen yogurt. Try not to eat pastries, cakes, pies, and cookies.  Exercise Follow your exercise program as told by your doctor.  Be more active. Try gardening, walking, and taking the stairs.  Ask your doctor about ways that you can be more active.  Medicine  Take over-the-counter and prescription medicines only as told by your doctor. This information is not intended to replace advice given to you by your health care provider. Make sure you discuss any questions you have with your health care provider. Document Released: 03/30/2008 Document Revised: 08/03/2015 Document Reviewed: 07/14/2015 Elsevier Interactive Patient Education  [employer].

## 2017-08-28 ENCOUNTER — Other Ambulatory Visit: Payer: Self-pay | Admitting: Nurse Practitioner

## 2017-08-28 ENCOUNTER — Other Ambulatory Visit: Payer: Self-pay

## 2017-08-28 DIAGNOSIS — R7303 Prediabetes: Secondary | ICD-10-CM

## 2017-08-28 DIAGNOSIS — E876 Hypokalemia: Secondary | ICD-10-CM

## 2017-08-28 DIAGNOSIS — E785 Hyperlipidemia, unspecified: Secondary | ICD-10-CM

## 2017-08-28 MED ORDER — POTASSIUM CHLORIDE CRYS ER 20 MEQ PO TBCR: 20.0000 meq | EXTENDED_RELEASE_TABLET | Freq: Every day | ORAL | 0 refills | Status: DC

## 2017-09-24 DIAGNOSIS — Z3202 Encounter for pregnancy test, result negative: Secondary | ICD-10-CM | POA: Diagnosis not present

## 2017-09-24 DIAGNOSIS — D069 Carcinoma in situ of cervix, unspecified: Secondary | ICD-10-CM | POA: Diagnosis not present

## 2017-09-24 DIAGNOSIS — R879 Unspecified abnormal finding in specimens from female genital organs: Secondary | ICD-10-CM | POA: Diagnosis not present

## 2017-12-15 DIAGNOSIS — G35 Multiple sclerosis: Secondary | ICD-10-CM

## 2017-12-15 HISTORY — DX: Multiple sclerosis: G35

## 2017-12-23 ENCOUNTER — Other Ambulatory Visit (INDEPENDENT_AMBULATORY_CARE_PROVIDER_SITE_OTHER): Payer: BLUE CROSS/BLUE SHIELD

## 2017-12-23 ENCOUNTER — Ambulatory Visit: Payer: Self-pay

## 2017-12-23 ENCOUNTER — Ambulatory Visit (INDEPENDENT_AMBULATORY_CARE_PROVIDER_SITE_OTHER): Payer: BLUE CROSS/BLUE SHIELD | Admitting: Nurse Practitioner

## 2017-12-23 ENCOUNTER — Encounter: Payer: Self-pay | Admitting: Nurse Practitioner

## 2017-12-23 VITALS — BP 120/90 | HR 93 | Ht 64.0 in | Wt 202.0 lb

## 2017-12-23 DIAGNOSIS — E876 Hypokalemia: Secondary | ICD-10-CM | POA: Diagnosis not present

## 2017-12-23 DIAGNOSIS — R29818 Other symptoms and signs involving the nervous system: Secondary | ICD-10-CM | POA: Diagnosis not present

## 2017-12-23 DIAGNOSIS — Z23 Encounter for immunization: Secondary | ICD-10-CM | POA: Diagnosis not present

## 2017-12-23 DIAGNOSIS — R200 Anesthesia of skin: Secondary | ICD-10-CM

## 2017-12-23 LAB — COMPREHENSIVE METABOLIC PANEL
ALT: 22 U/L (ref 0–35)
AST: 20 U/L (ref 0–37)
Albumin: 4.7 g/dL (ref 3.5–5.2)
Alkaline Phosphatase: 91 U/L (ref 39–117)
BUN: 22 mg/dL (ref 6–23)
CALCIUM: 9.7 mg/dL (ref 8.4–10.5)
CO2: 27 mEq/L (ref 19–32)
Chloride: 99 mEq/L (ref 96–112)
Creatinine, Ser: 0.77 mg/dL (ref 0.40–1.20)
GFR: 108.02 mL/min (ref >60.00)
Glucose, Bld: 115 mg/dL — ABNORMAL HIGH (ref 70–99)
Potassium: 3.3 mEq/L — ABNORMAL LOW (ref 3.5–5.1)
Sodium: 137 mEq/L (ref 135–145)
Total Bilirubin: 0.4 mg/dL (ref 0.2–1.2)
Total Protein: 8 g/dL (ref 6.0–8.3)

## 2017-12-23 LAB — TSH: TSH: 1.05 u[IU]/mL (ref 0.35–4.50)

## 2017-12-23 LAB — CBC
HCT: 43 % (ref 36.0–46.0)
Hemoglobin: 14.7 g/dL (ref 12.0–15.0)
MCHC: 34.2 g/dL (ref 30.0–36.0)
MCV: 89.1 fl (ref 78.0–100.0)
Platelets: 336 10*3/uL (ref 150.0–400.0)
RBC: 4.82 Mil/uL (ref 3.87–5.11)
RDW: 13.5 % (ref 11.5–15.5)
WBC: 10.5 10*3/uL (ref 4.0–10.5)

## 2017-12-23 LAB — SEDIMENTATION RATE: SED RATE: 21 mm/h — AB (ref 0–20)

## 2017-12-23 LAB — VITAMIN B12: Vitamin B-12: 307 pg/mL (ref 211–911)

## 2017-12-23 NOTE — Patient Instructions (Addendum)
Head downstaris for lab work.  You will be called to schedule MRI   Paresthesia Paresthesia is a burning or prickling feeling. This feeling can happen in any part of the body. It often happens in the hands, arms, legs, or feet. Usually, it is not painful. In most cases, the feeling goes away in a short time and is not a sign of a serious problem. Follow these instructions at home:  Avoid drinking alcohol.  Try massage or needle therapy (acupuncture) to help with your problems.  Keep all follow-up visits as told by your doctor. This is important. Contact a doctor if:  You keep on having episodes of paresthesia.  Your burning or prickling feeling gets worse when you walk.  You have pain or cramps.  You feel dizzy.  You have a rash. Get help right away if:  You feel weak.  You have trouble walking or moving.  You have problems speaking, understanding, or seeing.  You feel confused.  You cannot control when you pee (urinate) or poop (bowel movement).  You lose feeling (numbness) after an injury.  You pass out (faint). This information is not intended to replace advice given to you by your health care provider. Make sure you discuss any questions you have with your health care provider. Document Released: 12/15/2007 Document Revised: 06/09/2015 Document Reviewed: 12/28/2013 Elsevier Interactive Patient Education  [employer].

## 2017-12-23 NOTE — Telephone Encounter (Signed)
Pt called to report 5 days of numbness right hand, right tingling foot and slurred speech. Pt's speech was not garbled or noticeably slurred. Pt stated the symptoms are constant and pt stated that she is occasionally unsteady on her feet.  Pt denies headache, dizziness, vision loss or double vision. Per protocol pt needs to be seen in 4 hours or go to [facility]. Appt made with PCP this morning today at 1030 with PCP. Pt advised if symptoms worsen to go to the ED.Called PCP office and spoke with Delaney Meigs who stated that an OV is ok.   Reason for Disposition . [1] Numbness (i.e., loss of sensation) of the face, arm / hand, or leg / foot on one side of the body AND [2] gradual onset (e.g., days to weeks) AND [3] present now  Additional Information . Negative: [1] Tingling (e.g., pins and needles) of the face, arm / hand, or leg / foot on one side of the body AND [2] present now    Tingling right foot  Answer Assessment - Initial Assessment Questions 1. SYMPTOM: "What is the main symptom you are concerned about?" (e.g., weakness, numbness)     Numbness to the right side hands , slurred speech, tingling to right foot 2. ONSET: "When did this start?" (minutes, hours, days; while sleeping)     5 days ago 3. LAST NORMAL: "When was the last time you were normal (no symptoms)?"   6 days ago 4. PATTERN "Does this come and go, or has it been constant since it started?"  "Is it present now?"    constant 5. CARDIAC SYMPTOMS: "Have you had any of the following symptoms: chest pain, difficulty breathing, palpitations?"     no 6. NEUROLOGIC SYMPTOMS: "Have you had any of the following symptoms: headache, dizziness, vision loss, double vision, changes in speech, unsteady on your feet?"     Slurred speech, occasionally unsteady on her feet  7. OTHER SYMPTOMS: "Do you have any other symptoms?"     no 8. PREGNANCY: "Is there any chance you are pregnant?" "When was your last menstrual period?"     No LMP: ended 2 days  ago  Protocols used: NEUROLOGIC DEFICIT-A-AH

## 2017-12-23 NOTE — Progress Notes (Signed)
Victoria Ryan is a 37 y.o. female with the following history as recorded in Epic[employer]:  Patient Active Problem List   Diagnosis Date Noted  . Pre-diabetes 05/21/2017  . Hyperlipidemia 03/24/2017  . Obesity (BMI 30-39.9) 02/21/2017  . Encounter for general adult medical examination with abnormal findings 02/21/2017  . Acute upper respiratory infection 10/25/2016  . Essential hypertension, benign 06/13/2012  . Anxiety and depression 06/13/2012    Current Outpatient Medications  Medication Sig Dispense Refill  . amLODipine (NORVASC) 10 MG tablet TAKE 1 TABLET BY MOUTH ONCE DAILY 90 tablet 0  . APPLE CIDER VINEGAR PO Take 1 capsule by mouth daily.    . hydrochlorothiazide (HYDRODIURIL) 25 MG tablet Take 1 tablet (25 mg total) by mouth daily. 90 tablet 1  . sertraline (ZOLOFT) 100 MG tablet Take 0.5 tablets (50 mg total) by mouth daily. 30 tablet 1  . potassium chloride SA (K-DUR,KLOR-CON) 20 MEQ tablet Take 1 tablet (20 mEq total) by mouth daily. (Patient not taking: Reported on 12/23/2017) 5 tablet 0   No current facility-administered medications for this visit.     Allergies: Patient has no known allergies.  Past Medical History:  Diagnosis Date  . Anxiety   . Chicken pox   . Hypertension     Past Surgical History:  Procedure Laterality Date  . CESAREAN SECTION      Family History  Problem Relation Age of Onset  . Hypertension Mother   . Heart disease Father   . Hyperlipidemia Father   . Hypertension Father   . Breast cancer Paternal Grandmother   . Cancer Neg Hx     Social History   Tobacco Use  . Smoking status: Former Games developer  . Smokeless tobacco: Never Used  Substance Use Topics  . Alcohol use: Yes    Comment: occasionally wine     Subjective:  Victoria Ryan is here today requesting evaluation of numbness of right arm, hand and leg, accompanied by her husband to the visit today. The numbness began about 5 days ago, constant since onset, worse with movement. She  otherwise feels well. Denies fevers, chills, headaches, syncope, confusion, weakness, speech changes, chest pain, palpitations, shortness of breath, abdominal pain, nausea, vomiting, neck or back pain  Has noticed having to wear glasses recently due to blurred vision Her potassium was slightly low on august labs, a potassium replacement was sent but she never picked it up.  ROS- See HPI  Objective:  Vitals:   12/23/17 1016 12/23/17 1117  BP: 120/90   Pulse: (!) 111 93  SpO2: 96%   Weight: 202 lb (91.6 kg)   Height: 5\' 4"  (1.626 m)     General: Well developed, well nourished, in no acute distress  Skin : Warm and dry.  Head: Normocephalic and atraumatic  Eyes: Sclera and conjunctiva clear; pupils round and reactive to light; extraocular movements intact  Oropharynx: Pink, supple.  Neck: Supple without thyromegaly, adenopathy  Lungs: Respirations unlabored; clear to auscultation bilaterally without wheeze, rales, rhonchi  CVS exam: regular rhythm, normal S1, S2, no murmurs, rubs, clicks or gallops.  Musculoskeletal: No deformities; no active joint inflammation  Extremities: No edema, cyanosis Vessels: Symmetric bilaterally  Neurologic: Alert and oriented; speech intact; face symmetrical; moves all extremities well; CNII-XII intact without focal deficit  Psychiatric: Normal mood and affect.   Assessment:  1. Other symptoms and signs involving the nervous system   2. Need for influenza vaccination   3. Hypokalemia   4. Numbness  Plan:   No follow-ups on file.  Orders Placed This Encounter  Procedures  . MR Brain Wo Contrast    Standing Status:   Future    Standing Expiration Date:   02/24/2019    Order Specific Question:   What is the patient's sedation requirement?    Answer:   No Sedation    Order Specific Question:   Does the patient have a pacemaker or implanted devices?    Answer:   No    Order Specific Question:   Preferred imaging location?    Answer:   [facility] (table limit-550lbs)    Order Specific Question:   Radiology Contrast Protocol - do NOT remove file path    Answer:   [email or web address]  . Flu Vaccine QUAD 36+ mos IM  . CBC    Standing Status:   Future    Standing Expiration Date:   12/24/2018  . TSH    Standing Status:   Future    Standing Expiration Date:   12/24/2018  . Comprehensive metabolic panel    Standing Status:   Future    Standing Expiration Date:   12/24/2018  . Vitamin B12    Standing Status:   Future    Standing Expiration Date:   12/23/2018  . ANA    Standing Status:   Future    Standing Expiration Date:   12/23/2018  . Sedimentation rate    Standing Status:   Future    Standing Expiration Date:   12/23/2018  . EKG 12-Lead    Order Specific Question:   Where should this test be performed?    Answer:   Other    Requested Prescriptions    No prescriptions requested or ordered in this encounter    Reviewed health maintenance: Need for influenza vaccination - Flu Vaccine QUAD 36+ mos IM  Other symptoms and signs involving the nervous system Neuro exam is normal today Labs and imaging ordered today for further evaluation Home management, red flags and return/stroke precautions including when to seek immediate/emergency care discussed and printed on AVS F/U with further recommendations pending test results EKG done today, showing sinus rhythm, rate 93, with occasional PAC - CBC; Future - TSH; Future - Comprehensive metabolic panel; Future - Vitamin B12; Future - ANA; Future - Sedimentation rate; Future - MR Brain Wo Contrast; Future  Hypokalemia Recheck potassium level F/U with further recommendations pending lab results - Comprehensive metabolic panel; Future

## 2017-12-24 ENCOUNTER — Telehealth: Payer: Self-pay | Admitting: *Deleted

## 2017-12-24 NOTE — Telephone Encounter (Signed)
Copied from CRM [phone] [id number]. Topic: General - Other >> Dec 24, 2017 10:35 AM Ronney Lion A wrote: Reason for CRM: Pt called in wanting to know if Alphonse Guild could write her a doctor's notes to be out of work for the remaining of this week.   Please advise pt >> Dec 24, 2017 10:49 AM Marquis Buggy A wrote: Patient was seen yesterday 12/9.

## 2017-12-25 ENCOUNTER — Encounter: Payer: Self-pay | Admitting: Nurse Practitioner

## 2017-12-25 ENCOUNTER — Ambulatory Visit: Payer: Self-pay

## 2017-12-25 ENCOUNTER — Other Ambulatory Visit: Payer: Self-pay | Admitting: Nurse Practitioner

## 2017-12-25 DIAGNOSIS — R899 Unspecified abnormal finding in specimens from other organs, systems and tissues: Secondary | ICD-10-CM

## 2017-12-25 DIAGNOSIS — E876 Hypokalemia: Secondary | ICD-10-CM

## 2017-12-25 LAB — ANA: Anti Nuclear Antibody(ANA): NEGATIVE

## 2017-12-25 MED ORDER — POTASSIUM CHLORIDE CRYS ER 20 MEQ PO TBCR: 20.0000 meq | EXTENDED_RELEASE_TABLET | Freq: Every day | ORAL | 0 refills | Status: DC

## 2017-12-25 NOTE — Telephone Encounter (Signed)
Work note printed, additiona response sent in results note

## 2017-12-25 NOTE — Telephone Encounter (Signed)
Pt. called to report she has continued to have numbness/ weakness in right arm, hand, and leg and foot, and section of right side of head that involves the post. ear.  Stated the numbness/ weakness is continuous.  Reported no change in symptoms.  Verbalized she has noticed a change in her speech with occas. Slurring of words.  Denied change in vision.  Denied dizziness.  Stated feels off-balance at times.   Requesting lab results from 12/23/17.  Requesting a work excuse back-dated to 12/9 through 12/15.  Is not going to work today.  Stated she will return to work on Monday, 12/16.  She stated it has been very stressful to try to concentrate on her job, with her present symptoms.  Pt. tearful.        Reason for Disposition . [1] Caller requesting NON-URGENT health information AND [2] PCP's office is the best resource  Answer Assessment - Initial Assessment Questions 1. REASON FOR CALL or QUESTION: "What is your reason for calling today?" or "How can I best help you?" or "What question do you have that I can help answer?"     Still experiencing right sided numbness; requesting work excuse; requesting lab results.  Protocols used: INFORMATION ONLY CALL-A-AH

## 2017-12-25 NOTE — Telephone Encounter (Signed)
Response sent via results note 

## 2017-12-25 NOTE — Telephone Encounter (Signed)
Patient is calling in requesting a note for the remainder of the week due to still experiencing some numbness on her right side. She states it is difficult to work.

## 2017-12-27 ENCOUNTER — Other Ambulatory Visit: Payer: Self-pay | Admitting: Nurse Practitioner

## 2017-12-27 DIAGNOSIS — I10 Essential (primary) hypertension: Secondary | ICD-10-CM

## 2017-12-30 ENCOUNTER — Telehealth: Payer: Self-pay | Admitting: *Deleted

## 2017-12-30 ENCOUNTER — Telehealth: Payer: Self-pay | Admitting: Emergency Medicine

## 2017-12-30 ENCOUNTER — Ambulatory Visit
Admission: RE | Admit: 2017-12-30 | Discharge: 2017-12-30 | Disposition: A | Discharge status: Home / Self Care | Payer: BLUE CROSS/BLUE SHIELD | Source: Ambulatory Visit | Attending: Nurse Practitioner | Admitting: Nurse Practitioner

## 2017-12-30 ENCOUNTER — Other Ambulatory Visit: Payer: Self-pay | Admitting: Nurse Practitioner

## 2017-12-30 DIAGNOSIS — R200 Anesthesia of skin: Secondary | ICD-10-CM

## 2017-12-30 DIAGNOSIS — R9389 Abnormal findings on diagnostic imaging of other specified body structures: Secondary | ICD-10-CM

## 2017-12-30 DIAGNOSIS — R29818 Other symptoms and signs involving the nervous system: Secondary | ICD-10-CM

## 2017-12-30 NOTE — Telephone Encounter (Signed)
Received called from [facility] stating report was in for MRI.

## 2017-12-30 NOTE — Telephone Encounter (Signed)
I just spoke to the patient and agreed take her out of work for the next 1 month, can we do FMLA for 1 month? Thx!

## 2017-12-30 NOTE — Telephone Encounter (Signed)
Copied from CRM [id number]. Topic: General - Other >> Dec 24, 2017 10:35 AM Ronney Lion A wrote: Reason for CRM: Pt called in wanting to know if Alphonse Guild could write her a doctor's notes to be out of work for the remaining of this week.   Please advise pt >> Dec 24, 2017 10:49 AM Marquis Buggy A wrote: Patient was seen yesterday 12/9. >> Dec 30, 2017  7:28 AM Fanny Bien wrote: Pt called and stated that she would also like to be out of work from 12/30/17-01/03/18. Please advise [phone]

## 2017-12-30 NOTE — Telephone Encounter (Signed)
Forms have been completed and faxed to [phone], Copy sent to scan. Patient has been informed and original mailed to patient.

## 2017-12-30 NOTE — Telephone Encounter (Signed)
noted 

## 2018-01-02 ENCOUNTER — Encounter: Payer: Self-pay | Admitting: Nurse Practitioner

## 2018-01-09 ENCOUNTER — Ambulatory Visit (INDEPENDENT_AMBULATORY_CARE_PROVIDER_SITE_OTHER): Payer: BLUE CROSS/BLUE SHIELD | Admitting: Neurology

## 2018-01-09 ENCOUNTER — Other Ambulatory Visit: Payer: Self-pay

## 2018-01-09 ENCOUNTER — Encounter: Payer: Self-pay | Admitting: Neurology

## 2018-01-09 VITALS — BP 135/95 | HR 100 | Ht 64.0 in | Wt 202.0 lb

## 2018-01-09 DIAGNOSIS — G379 Demyelinating disease of central nervous system, unspecified: Secondary | ICD-10-CM | POA: Diagnosis not present

## 2018-01-09 DIAGNOSIS — H539 Unspecified visual disturbance: Secondary | ICD-10-CM | POA: Diagnosis not present

## 2018-01-09 DIAGNOSIS — R260 Ataxic gait: Secondary | ICD-10-CM

## 2018-01-09 DIAGNOSIS — R200 Anesthesia of skin: Secondary | ICD-10-CM

## 2018-01-09 DIAGNOSIS — I10 Essential (primary) hypertension: Secondary | ICD-10-CM

## 2018-01-09 DIAGNOSIS — G35A Relapsing-remitting multiple sclerosis: Secondary | ICD-10-CM | POA: Insufficient documentation

## 2018-01-09 DIAGNOSIS — G35 Multiple sclerosis: Secondary | ICD-10-CM | POA: Diagnosis not present

## 2018-01-09 DIAGNOSIS — R32 Unspecified urinary incontinence: Secondary | ICD-10-CM

## 2018-01-09 NOTE — Progress Notes (Signed)
[facility]  PATIENT: Victoria Ryan DOB: [birth year]  REFERRING DOCTOR OR PCP: Referred by Alphonse GuildAshleigh Shambley NP SOURCE: patient, Notes from [name], imaging and lab reports, MRI images were personally reviewed in [name]'s presence  _________________________________   HISTORICAL  CHIEF COMPLAINT:  Chief Complaint  Patient presents with  . New Patient (Initial Visit)    RM 12 with kids and parents. Internal referral for possible MS. Abnormal MRI can be seen in [facility]. completed in 12/30/17. Complains of numbness, slurred speech, insomnia, RLS.     HISTORY OF PRESENT ILLNESS:  I had the pleasure of seeing your patient, Victoria Ryan, at the [facility] for neurologic consultation regarding her numbness and abnormal brain MRI and possibility of MS.  She is a 37 yo woman who noted numbness on the right laeg and arm Monday 12/24/2017.   She was under a lot of stress and noted anxiety.   She saw her PCP and was told that symptoms would likely improve after af few days off.     She also noted her voice was mildly slurred and the left face was num (also milder right face).   She did not note diplopia.   She had no problems with eating or swallowing.   She noted that her handwriting and typing were off.     When symptoms persisted she had a brain MRI 12/30/2017.  I reviewed the report and personally reviewed the images.  The brain MRI showed a large focus in the left pons and a smaller subcortical focus in the left parietal lobe.  Additionally there appeared to be a small periventricular focus on the left.  She had a couple of more nonspecific appearing T2/flair hyperintense foci in the subcortical or deep white matter of the hemispheres.  She also noted some difficulty with her right vision.   She has mildly reduced acuity and altered color vision out of that eye.    Currently about 50-60% better than 2  weeks ago.      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   She is pre-diabetic.    No FH of MS or other autoimmune disorder.       REVIEW OF SYSTEMS: Constitutional: No fevers, chills, sweats, or change in appetite.  She has insomnia. Eyes: No visual changes, double vision, eye pain Ear, nose and throat: No hearing loss, ear pain, nasal congestion, sore throat Cardiovascular: No chest pain, palpitations Respiratory: No shortness of breath at rest or with exertion.   No wheezes GastrointestinaI: No nausea, vomiting, diarrhea, abdominal pain, fecal incontinence Genitourinary:Urinary frequency and urgency with occasional urge incontinence.   Musculoskeletal: No neck pain, back pain Integumentary: No rash, pruritus, skin lesions Neurological: as above Psychiatric: No depression at this time.  No anxiety Endocrine: No palpitations, diaphoresis, change in appetite, change in weigh or increased thirst Hematologic/Lymphatic: No anemia, purpura, petechiae. Allergic/Immunologic: No itchy/runny eyes, nasal congestion, recent allergic reactions, rashes  ALLERGIES: No Known Allergies  HOME MEDICATIONS:  Current Outpatient Medications:  .  amLODipine (NORVASC) 10 MG tablet, TAKE 1 TABLET BY MOUTH ONCE DAILY, Disp: 90 tablet, Rfl: 1 .  hydrochlorothiazide (HYDRODIURIL) 25 MG tablet, Take 1 tablet (25 mg total) by mouth daily., Disp: 90 tablet, Rfl: 1  PAST MEDICAL HISTORY: Past Medical History:  Diagnosis Date  . Anxiety   . Chicken pox   . Hypertension  PAST SURGICAL HISTORY: Past Surgical History:  Procedure Laterality Date  . CESAREAN SECTION     x2    FAMILY HISTORY: Family History  Problem Relation Age of Onset  . Hypertension Mother   . Heart disease Father   . Hyperlipidemia Father   . Hypertension Father   . Breast cancer Paternal Grandmother   . Cancer Neg Hx     SOCIAL HISTORY:  Social History   Socioeconomic History  .  Marital status: Married    Spouse name: [name]  . Number of children: 2  . Years of education: 5  . Highest education level: Not on file  Occupational History  . Occupation: Clinical biochemist Rep  Social Needs  . Financial resource strain: Not on file  . Food insecurity:    Worry: Not on file    Inability: Not on file  . Transportation needs:    Medical: Not on file    Non-medical: Not on file  Tobacco Use  . Smoking status: Former Games developer  . Smokeless tobacco: Never Used  Substance and Sexual Activity  . Alcohol use: Yes    Comment: occasionally wine  . Drug use: No  . Sexual activity: Yes    Birth control/protection: None  Lifestyle  . Physical activity:    Days per week: Not on file    Minutes per session: Not on file  . Stress: Not on file  Relationships  . Social connections:    Talks on phone: Not on file    Gets together: Not on file    Attends religious service: Not on file    Active member of club or organization: Not on file    Attends meetings of clubs or organizations: Not on file    Relationship status: Not on file  . Intimate partner violence:    Fear of current or ex partner: Not on file    Emotionally abused: Not on file    Physically abused: Not on file    Forced sexual activity: Not on file  Other Topics Concern  . Not on file  Social History Narrative   Lives w/ husband and kids   Regular exercise-yes   Caffeine Use-yes   Right handed      PHYSICAL EXAM  Vitals:   01/09/18 0856  BP: (!) 135/95  Pulse: 100  Weight: 202 lb (91.6 kg)  Height: 5\' 4"  (1.626 m)    Body mass index is 34.67 kg/m.   General: The patient is well-developed and well-nourished and in no acute distress  Eyes:  Funduscopic exam shows normal optic discs and retinal vessels.  Visual acuity is 20/25 OS and 20/40-2 OD  Neck: The neck is supple, no carotid bruits are noted.  The neck is nontender.  Cardiovascular: The heart has a regular rate and rhythm  with a normal S1 and S2. There were no murmurs, gallops or rubs. Lungs are clear to auscultation.  Skin: Extremities are without significant edema.  Musculoskeletal:  Back is nontender  Neurologic Exam  Mental status: The patient is alert and oriented x 3 at the time of the examination. The patient has apparent normal recent and remote memory, with an apparently normal attention span and concentration ability.   Speech is normal.  Cranial nerves: Extraocular movements are full.  She had a 1+ a apparent pupillary defect on the right.  Color vision was muted on the right..  Facial symmetry is present. There is good facial sensation to soft touch bilaterally.Facial  strength is normal.  Trapezius and sternocleidomastoid strength is normal. No dysarthria is noted.  The tongue is midline, and the patient has symmetric elevation of the soft palate. No obvious hearing deficits are noted.  Motor:  Muscle bulk is normal.   Tone is normal. Strength is  5 / 5 in all 4 extremities.   Sensory: Sensory testing is intact to pinprick, soft touch and vibration sensation in all 4 extremities.  Coordination: Cerebellar testing reveals good finger-nose-finger and heel-to-shin bilaterally.  He has slightly reduced finger-nose-finger on the right.  Gait and station: Station is normal.   Gait is normal. Tandem gait is wide. . Romberg is negative.   Reflexes: Deep tendon reflexes are symmetric and normal bilaterally.   Plantar responses are flexor.    DIAGNOSTIC DATA (LABS, IMAGING, TESTING) - I reviewed patient records, labs, notes, testing and imaging myself where available.  Lab Results  Component Value Date   WBC 10.5 12/23/2017   HGB 14.7 12/23/2017   HCT 43.0 12/23/2017   MCV 89.1 12/23/2017   PLT 336.0 12/23/2017      Component Value Date/Time   NA 137 12/23/2017 1118   K 3.3 (L) 12/23/2017 1118   CL 99 12/23/2017 1118   CO2 27 12/23/2017 1118   GLUCOSE 115 (H) 12/23/2017 1118   BUN 22  12/23/2017 1118   CREATININE 0.77 12/23/2017 1118   CALCIUM 9.7 12/23/2017 1118   PROT 8.0 12/23/2017 1118   ALBUMIN 4.7 12/23/2017 1118   AST 20 12/23/2017 1118   ALT 22 12/23/2017 1118   ALKPHOS 91 12/23/2017 1118   BILITOT 0.4 12/23/2017 1118   GFRNONAA >60 04/24/2016 1329   GFRAA >60 04/24/2016 1329   Lab Results  Component Value Date   CHOL 273 (H) 08/22/2017   HDL 44.60 08/22/2017   LDLCALC 215 (H) 02/21/2017   LDLDIRECT 201.0 08/22/2017   TRIG 203.0 (H) 08/22/2017   CHOLHDL 6 08/22/2017   Lab Results  Component Value Date   HGBA1C 6.2 08/22/2017   Lab Results  Component Value Date   VITAMINB12 307 12/23/2017   Lab Results  Component Value Date   TSH 1.05 12/23/2017       ASSESSMENT AND PLAN  Demyelinating changes in brain (HCC)  Multiple sclerosis (HCC)  Visual disturbance  Numbness  Ataxic gait  Essential hypertension, benign   In summary, Mrs. Kathie Rhodes Laramee is a 37 year old woman who had the onset of right body and left face numbness about 2 and half weeks ago.  Additionally, over the last couple months she has noticed more difficulty with bladder function and with vision out of the right eye.  I discussed with her that that the MRI is consistent with MS and I am about 80% sure of the diagnosis.  However, we must rule out some processes that may mimic MS such as polyarteritis nodosa and other autoimmune disease.  Additionally, we need to check a visual evoked potential to see if there is evidence of right optic neuritis.  Single evidence would increase the likelihood that she has MS.  Furthermore, because of the bladder dysfunction and the possibility of MS, we need to check an MRI of the cervical and thoracic spinal cord to rule out a demyelinating plaque.  If a plaque is seen, that will increase the likelihood that she has MS.  If fine over these tests are negative, then we will need to do a lumbar puncture to determine if the CSF is consistent with MS.  I  discussed all of this with Mrs. Caylor.  I answered her questions.  She will return to see me in 3 weeks or call sooner if new or worsening neurologic symptoms.  Thank you very much for asking me to see Mrs. Dawes.  Please let me know if I can be of further assistance with her or other patients in the future.  A. Epimenio Foot, MD, PhD, FAAN Certified in Neurology, Clinical Neurophysiology, Sleep Medicine, Pain Medicine and Neuroimaging Director, [facility] at [facility]  [facility] [address], [address] [place], [place] [address] [phone]

## 2018-01-10 LAB — ANA W/REFLEX: ANA: NEGATIVE

## 2018-01-10 LAB — PAN-ANCA
ANCA Proteinase 3: <3.5 U/mL (ref 0.0–3.5)
Atypical pANCA: <1:20 {titer}
C-ANCA: <1:20 {titer}
Myeloperoxidase Ab: <9 U/mL (ref 0.0–9.0)
P-ANCA: <1:20 {titer}

## 2018-01-10 LAB — SEDIMENTATION RATE: Sed Rate: 11 mm/hr (ref 0–32)

## 2018-01-13 ENCOUNTER — Telehealth: Payer: Self-pay | Admitting: *Deleted

## 2018-01-13 NOTE — Telephone Encounter (Signed)
Called and spoke with pt. Advised lab work looked fine per Dr. Sater. She verbalized understanding.  

## 2018-01-13 NOTE — Telephone Encounter (Signed)
-----   Message from Asa Lenteichard A Sater, MD sent at 01/11/2018  2:45 PM EST ----- Please let her know the blood work looked fine.  We can let her know the results of the others studies after they are done.

## 2018-01-16 ENCOUNTER — Telehealth: Payer: Self-pay | Admitting: Neurology

## 2018-01-16 NOTE — Telephone Encounter (Signed)
[employer] Auth: [id number], [id number] & [id number] (exp. 01/16/18 to 03/02/18) order sent to [facility]. Patient is aware and I gave her GI phone number of [phone] and to give them a call if she has not heard from them in the next 2-3 business days.

## 2018-01-21 ENCOUNTER — Ambulatory Visit: Payer: 59

## 2018-01-22 ENCOUNTER — Telehealth: Payer: Self-pay | Admitting: *Deleted

## 2018-01-22 DIAGNOSIS — Z0289 Encounter for other administrative examinations: Secondary | ICD-10-CM

## 2018-01-22 NOTE — Telephone Encounter (Signed)
Gave completed/signed paperwork to medical records to process for pt.

## 2018-01-22 NOTE — Telephone Encounter (Signed)
Pt. returned my call.  Sts. the last date she worked was 12/21/17. FMLA forms, for continuous leave from 12/22/17-02/14/18 completed, awaiting RAS sig, then will fax to pt's  leave mx. company per her request/fim

## 2018-01-22 NOTE — Telephone Encounter (Signed)
LMTC.  I am working on [employer] forms for her and need to know the last date that she worked./fim

## 2018-01-23 NOTE — Telephone Encounter (Signed)
I faxed pt fmla form on 01/23/18

## 2018-01-26 ENCOUNTER — Ambulatory Visit
Admission: RE | Admit: 2018-01-26 | Discharge: 2018-01-26 | Disposition: A | Discharge status: Home / Self Care | Payer: 59 | Source: Ambulatory Visit | Attending: Neurology | Admitting: Neurology

## 2018-01-26 DIAGNOSIS — G35 Multiple sclerosis: Secondary | ICD-10-CM

## 2018-01-26 DIAGNOSIS — R32 Unspecified urinary incontinence: Secondary | ICD-10-CM

## 2018-01-26 DIAGNOSIS — G379 Demyelinating disease of central nervous system, unspecified: Secondary | ICD-10-CM

## 2018-01-26 MED ORDER — GADOBENATE DIMEGLUMINE 529 MG/ML IV SOLN
20.0000 mL | Freq: Once | INTRAVENOUS | Status: AC | PRN
  Administered 2018-01-26: 20 mL via INTRAVENOUS

## 2018-01-29 ENCOUNTER — Telehealth: Payer: Self-pay | Admitting: Neurology

## 2018-01-29 ENCOUNTER — Ambulatory Visit
Admission: RE | Admit: 2018-01-29 | Discharge: 2018-01-29 | Disposition: A | Discharge status: Home / Self Care | Payer: 59 | Source: Ambulatory Visit | Attending: Neurology | Admitting: Neurology

## 2018-01-29 DIAGNOSIS — R32 Unspecified urinary incontinence: Secondary | ICD-10-CM

## 2018-01-29 DIAGNOSIS — G379 Demyelinating disease of central nervous system, unspecified: Secondary | ICD-10-CM | POA: Diagnosis not present

## 2018-01-29 MED ORDER — GADOBENATE DIMEGLUMINE 529 MG/ML IV SOLN
18.0000 mL | Freq: Once | INTRAVENOUS | Status: AC | PRN
  Administered 2018-01-29: 18 mL via INTRAVENOUS

## 2018-01-29 NOTE — Telephone Encounter (Signed)
I called but got voicemail.  I left a message saying I was called to go over the MRI results.  MRI of the brain shows that there is enhancement of the pontine lesion in a pattern consistent with an acute demyelinating plaque.  The additional sagittal FLAIR images show a couple of the additional brain lesions are very consistent with chronic demyelinating plaque.  The cervical and thoracic MRIs show a normal spinal cord though she does have some degenerative changes that do not lead to spinal stenosis.  She still has a visual evoked potential pending.  The appearance of the brain MRI making MS more likely and I would recommend that we begin a disease modifying therapy.  I will try to reach her again tomorrow and we will try to get her back into the office within a week or so.

## 2018-01-30 NOTE — Telephone Encounter (Signed)
I spoke with her.    She will come in next Tuesday.

## 2018-02-04 ENCOUNTER — Ambulatory Visit (INDEPENDENT_AMBULATORY_CARE_PROVIDER_SITE_OTHER): Payer: 59 | Admitting: Neurology

## 2018-02-04 DIAGNOSIS — G379 Demyelinating disease of central nervous system, unspecified: Secondary | ICD-10-CM | POA: Diagnosis not present

## 2018-02-04 NOTE — Progress Notes (Signed)
    History:  38 year old woman with right visual disturbance and an abnormal brain MRI.     Description: The visual evoked response test was performed today using 32 x 32 check sizes.  The visual acuity was 20/20 OD and 20/20 OS corrected.  The P100 response was 108 ms on the right and 109 ms on the left, both within the normal limits.  Impression:  The visual evoked response test above was within normal limits bilaterally. No evidence of conduction slowing was seen within the anterior visual pathways on either side on today's evaluation.   Juli Odom A. Epimenio Foot, MD, PhD, FAAN Certified in Neurology, Clinical Neurophysiology, Sleep Medicine, Pain Medicine and Neuroimaging Director, [facility] at [facility]  [facility] [address] [place], [place] [address] [phone]

## 2018-02-06 ENCOUNTER — Telehealth: Payer: Self-pay | Admitting: *Deleted

## 2018-02-06 ENCOUNTER — Encounter: Payer: Self-pay | Admitting: Neurology

## 2018-02-06 ENCOUNTER — Ambulatory Visit (INDEPENDENT_AMBULATORY_CARE_PROVIDER_SITE_OTHER): Payer: 59 | Admitting: Neurology

## 2018-02-06 VITALS — BP 138/85 | HR 101 | Ht 64.0 in | Wt 196.5 lb

## 2018-02-06 DIAGNOSIS — R200 Anesthesia of skin: Secondary | ICD-10-CM | POA: Diagnosis not present

## 2018-02-06 DIAGNOSIS — Z79899 Other long term (current) drug therapy: Secondary | ICD-10-CM

## 2018-02-06 DIAGNOSIS — R260 Ataxic gait: Secondary | ICD-10-CM

## 2018-02-06 DIAGNOSIS — E559 Vitamin D deficiency, unspecified: Secondary | ICD-10-CM

## 2018-02-06 DIAGNOSIS — G35 Multiple sclerosis: Secondary | ICD-10-CM | POA: Diagnosis not present

## 2018-02-06 MED ORDER — GABAPENTIN 300 MG PO CAPS: ORAL_CAPSULE | ORAL | 11 refills | Status: DC

## 2018-02-06 NOTE — Progress Notes (Signed)
[facility]  PATIENT: Victoria Ryan DOB: 1980-07-01  REFERRING DOCTOR OR PCP: Referred by Alphonse Guild NP SOURCE: patient, Notes from [name], imaging and lab reports, MRI images were personally reviewed in Ms. Gerrard's presence  _________________________________   HISTORICAL  CHIEF COMPLAINT:  Chief Complaint  Patient presents with  . Follow-up    RM 13, alone. Last seen 02/04/18 for visual evoked potential test. Here to discuss treatment for MS.     HISTORY OF PRESENT ILLNESS:  [name] is a 38 y.o. woman with MS diagnosed January 2020.  Update 02/06/2018: She feels her numbness in the left face and right arm has improved but has not resolved.    She still has mild slurred speech but is better.    Balance is mildly off.   No falls  She had a contrasted MRI of the brain and the lesion showed peripheral enhancement with gadolinium.    No new lesions were seen.   She has 3 periventricular lesions, a couple other hemispheric lesions and the large left pontine acute lesion.    The spinal cord showed no lesions.   VEP was normal.    I reviewed the MRIs in her presence and pointed out the foci consistent with MS and the enhancement of the pontine focus.    We had a discussion of different disease modifying therapies.  She is presenting with possibly higher than average aggressiveness with a large infratentorial focus in the pons.  Fortunately she does not also have spinal cord lesions.  I would like her to be on a fairly effective disease modifying therapy.  We discussed Vumerity, Mayzent, Tysabri and Ocrevus.  She was most interested in either Tysabri or Vumerity.     From 01/10/2019 She is a 38 yo woman woman who noted numbness on the right laeg and arm Monday 12/24/2017.   She was under a lot of stress and noted anxiety.   She saw her PCP and was told that symptoms would likely improve after af few days off.     She also noted her voice was mildly  slurred and the left face was num (also milder right face).   She did not note diplopia.   She had no problems with eating or swallowing.   She noted that her handwriting and typing were off.     When symptoms persisted she had a brain MRI 12/30/2017.  I reviewed the report and personally reviewed the images.  The brain MRI showed a large focus in the left pons and a smaller subcortical focus in the left parietal lobe.  Additionally there appeared to be a small periventricular focus on the left.  She had a couple of more nonspecific appearing T2/flair hyperintense foci in the subcortical or deep white matter of the hemispheres.  She also noted some difficulty with her right vision.   She has mildly reduced acuity and altered color vision out of that eye.    Currently about 50-60% better than 2 weeks ago.      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   She is pre-diabetic.    No FH of MS or other autoimmune disorder.       REVIEW OF SYSTEMS: Constitutional: No fevers, chills, sweats, or change in appetite.  She has insomnia. Eyes: No visual changes, double vision, eye pain Ear, nose and throat: No hearing loss, ear pain, nasal congestion, sore throat Cardiovascular: No chest pain, palpitations Respiratory: No  shortness of breath at rest or with exertion.   No wheezes GastrointestinaI: No nausea, vomiting, diarrhea, abdominal pain, fecal incontinence Genitourinary:Urinary frequency and urgency with occasional urge incontinence.   Musculoskeletal: No neck pain, back pain Integumentary: No rash, pruritus, skin lesions Neurological: as above Psychiatric: No depression at this time.  No anxiety Endocrine: No palpitations, diaphoresis, change in appetite, change in weigh or increased thirst Hematologic/Lymphatic: No anemia, purpura, petechiae. Allergic/Immunologic: No itchy/runny eyes, nasal congestion, recent allergic reactions, rashes  ALLERGIES: No Known  Allergies  HOME MEDICATIONS:  Current Outpatient Medications:  .  amLODipine (NORVASC) 10 MG tablet, TAKE 1 TABLET BY MOUTH ONCE DAILY, Disp: 90 tablet, Rfl: 1 .  hydrochlorothiazide (HYDRODIURIL) 25 MG tablet, Take 1 tablet (25 mg total) by mouth daily., Disp: 90 tablet, Rfl: 1 .  gabapentin (NEURONTIN) 300 MG capsule, One po tid, Disp: 90 capsule, Rfl: 11  PAST MEDICAL HISTORY: Past Medical History:  Diagnosis Date  . Anxiety   . Chicken pox   . Hypertension     PAST SURGICAL HISTORY: Past Surgical History:  Procedure Laterality Date  . CESAREAN SECTION     x2    FAMILY HISTORY: Family History  Problem Relation Age of Onset  . Hypertension Mother   . Heart disease Father   . Hyperlipidemia Father   . Hypertension Father   . Breast cancer Paternal Grandmother   . Cancer Neg Hx     SOCIAL HISTORY:  Social History   Socioeconomic History  . Marital status: Married    Spouse name: [name]  . Number of children: 2  . Years of education: 66  . Highest education level: Not on file  Occupational History  . Occupation: Clinical biochemist Rep  Social Needs  . Financial resource strain: Not on file  . Food insecurity:    Worry: Not on file    Inability: Not on file  . Transportation needs:    Medical: Not on file    Non-medical: Not on file  Tobacco Use  . Smoking status: Former Games developer  . Smokeless tobacco: Never Used  Substance and Sexual Activity  . Alcohol use: Yes    Comment: occasionally wine  . Drug use: No  . Sexual activity: Yes    Birth control/protection: None  Lifestyle  . Physical activity:    Days per week: Not on file    Minutes per session: Not on file  . Stress: Not on file  Relationships  . Social connections:    Talks on phone: Not on file    Gets together: Not on file    Attends religious service: Not on file    Active member of club or organization: Not on file    Attends meetings of clubs or organizations: Not on file     Relationship status: Not on file  . Intimate partner violence:    Fear of current or ex partner: Not on file    Emotionally abused: Not on file    Physically abused: Not on file    Forced sexual activity: Not on file  Other Topics Concern  . Not on file  Social History Narrative   Lives w/ husband and kids   Regular exercise-yes   Caffeine Use-yes   Right handed      PHYSICAL EXAM  Vitals:   02/06/18 1052  BP: 138/85  Pulse: (!) 101  SpO2: 98%  Weight: 196 lb 8 oz (89.1 kg)  Height: 5\' 4"  (1.626 m)  Body mass index is 33.73 kg/m.   General: The patient is well-developed and well-nourished and in no acute distress   Neurologic Exam  Mental status: The patient is alert and oriented x 3 at the time of the examination. The patient has apparent normal recent and remote memory, with an apparently normal attention span and concentration ability.   Speech is normal.  Cranial nerves: Extraocular movements are full.    Facial strength is normal.  Trapezius strength is normal.  The tongue is midline, and the patient has symmetric elevation of the soft palate. No obvious hearing deficits are noted.  Motor: Normal muscle tone and bulk.  Normal strength.  Sensory: Intact sensation to touch  Coordination: Cerebellar shows good finger-nose-finger and heel-to-shin  Gait and station: Station is normal.   Gait is normal. Tandem gait is mildly wide.   Reflexes: Deep tendon reflexes are symmetric and normal bilaterally.      DIAGNOSTIC DATA (LABS, IMAGING, TESTING) - I reviewed patient records, labs, notes, testing and imaging myself where available.  Lab Results  Component Value Date   WBC 10.5 12/23/2017   HGB 14.7 12/23/2017   HCT 43.0 12/23/2017   MCV 89.1 12/23/2017   PLT 336.0 12/23/2017      Component Value Date/Time   NA 137 12/23/2017 1118   K 3.3 (L) 12/23/2017 1118   CL 99 12/23/2017 1118   CO2 27 12/23/2017 1118   GLUCOSE 115 (H) 12/23/2017 1118   BUN 22  12/23/2017 1118   CREATININE 0.77 12/23/2017 1118   CALCIUM 9.7 12/23/2017 1118   PROT 8.0 12/23/2017 1118   ALBUMIN 4.7 12/23/2017 1118   AST 20 12/23/2017 1118   ALT 22 12/23/2017 1118   ALKPHOS 91 12/23/2017 1118   BILITOT 0.4 12/23/2017 1118   GFRNONAA >60 04/24/2016 1329   GFRAA >60 04/24/2016 1329   Lab Results  Component Value Date   CHOL 273 (H) 08/22/2017   HDL 44.60 08/22/2017   LDLCALC 215 (H) 02/21/2017   LDLDIRECT 201.0 08/22/2017   TRIG 203.0 (H) 08/22/2017   CHOLHDL 6 08/22/2017   Lab Results  Component Value Date   HGBA1C 6.2 08/22/2017   Lab Results  Component Value Date   VITAMINB12 307 12/23/2017   Lab Results  Component Value Date   TSH 1.05 12/23/2017       ASSESSMENT AND PLAN  Multiple sclerosis (HCC) - Plan: CBC with Differential/Platelet, Stratify JCV Antibody Test ([facility]), Hepatic function panel  High risk medication use - Plan: CBC with Differential/Platelet, Stratify JCV Antibody Test ([facility]), Hepatic function panel  Vitamin D deficiency - Plan: VITAMIN D 25 Hydroxy (Vit-D Deficiency, Fractures)  Numbness  Ataxic gait   1.    We discussed the imaging studies and other studies.  They are consistent with relapsing remitting multiple sclerosis.  Therefore, I would want her to go on it disease modifying therapy.  Due to the presence of a symptomatic infratentorial lesion her level of aggressiveness may be a little higher and I recommended a more efficacious disease modifying therapy.  We discussed Vumerity, Mayzent, Tysabri and Ocrevus.  She was most interested in either Vumerity and Tysabri and we will check the blood work to see if she is a candidate for these medications. 2.    Check vitamin D as vitamin D deficiency may lead to more progression in MS. 3.    Gabapentin for dysesthesias 4.    Return in 3 months or sooner if there are new or worsening  neurologic symptoms. Rayn Enderson A. Epimenio Foot, MD, PhD, FAAN Certified in Neurology, Clinical  Neurophysiology, Sleep Medicine, Pain Medicine and Neuroimaging Director, [facility] at [facility]  [facility] [address], [address] [place], [place] [address] [phone]

## 2018-02-06 NOTE — Telephone Encounter (Signed)
Placed JCV lab in [facility] lock box for routine lab pick up.  

## 2018-02-07 ENCOUNTER — Telehealth: Payer: Self-pay | Admitting: *Deleted

## 2018-02-07 LAB — CBC WITH DIFFERENTIAL/PLATELET
Basophils Absolute: 0 10*3/uL (ref 0.0–0.2)
Basos: 0 %
EOS (ABSOLUTE): 0.1 10*3/uL (ref 0.0–0.4)
Eos: 1 %
Hematocrit: 44.5 % (ref 34.0–46.6)
Hemoglobin: 15.3 g/dL (ref 11.1–15.9)
Immature Grans (Abs): 0 10*3/uL (ref 0.0–0.1)
Immature Granulocytes: 0 %
Lymphocytes Absolute: 3.1 10*3/uL (ref 0.7–3.1)
Lymphs: 36 %
MCH: 30.2 pg (ref 26.6–33.0)
MCHC: 34.4 g/dL (ref 31.5–35.7)
MCV: 88 fL (ref 79–97)
MONOS ABS: 0.6 10*3/uL (ref 0.1–0.9)
Monocytes: 7 %
Neutrophils Absolute: 4.8 10*3/uL (ref 1.4–7.0)
Neutrophils: 56 %
Platelets: 343 10*3/uL (ref 150–450)
RBC: 5.06 x10E6/uL (ref 3.77–5.28)
RDW: 12.8 % (ref 11.7–15.4)
WBC: 8.6 10*3/uL (ref 3.4–10.8)

## 2018-02-07 LAB — HEPATIC FUNCTION PANEL
ALT: 20 IU/L (ref 0–32)
AST: 20 IU/L (ref 0–40)
Albumin: 4.7 g/dL (ref 3.8–4.8)
Alkaline Phosphatase: 100 IU/L (ref 39–117)
Bilirubin Total: 0.3 mg/dL (ref 0.0–1.2)
Bilirubin, Direct: 0.09 mg/dL (ref 0.00–0.40)
Total Protein: 7.3 g/dL (ref 6.0–8.5)

## 2018-02-07 LAB — VITAMIN D 25 HYDROXY (VIT D DEFICIENCY, FRACTURES): Vit D, 25-Hydroxy: 12.2 ng/mL — ABNORMAL LOW (ref 30.0–100.0)

## 2018-02-07 MED ORDER — VITAMIN D (ERGOCALCIFEROL) 1.25 MG (50000 UNIT) PO CAPS: 50000.0000 [IU] | ORAL_CAPSULE | ORAL | 4 refills | Status: DC

## 2018-02-07 NOTE — Addendum Note (Signed)
Addended by: Lindell Spar C on: 02/07/2018 12:08 PM   Modules accepted: Orders

## 2018-02-07 NOTE — Telephone Encounter (Signed)
Left patient a detailed message, with results and new prescription information, on voicemail (ok per DPR).  Provided our number to call back with any questions.  Rx for vitamin D has been sent to the pharmacy.

## 2018-02-07 NOTE — Telephone Encounter (Signed)
-----   Message from Asa Lente, MD sent at 02/07/2018 10:46 AM EST ----- Vitamin D is very low.  I would like her to take 50,000 units weekly for the year.   #13   #4     We will call her back once the JCV antibody has returned.

## 2018-02-13 NOTE — Telephone Encounter (Addendum)
Returned call to patient.  She confirmed that she received our message about her vitamin D deficiency.  She is agreeable to start the prescribed supplement.

## 2018-02-13 NOTE — Telephone Encounter (Signed)
Pt is asking to be called with any other test results

## 2018-02-14 ENCOUNTER — Encounter: Payer: Self-pay | Admitting: *Deleted

## 2018-02-14 ENCOUNTER — Telehealth: Payer: Self-pay | Admitting: *Deleted

## 2018-02-14 NOTE — Telephone Encounter (Signed)
Faxed completed/signed Vumerity start form to [employer] at [phone]. Received fax confirmation.  

## 2018-02-14 NOTE — Telephone Encounter (Signed)
JCV ab drawn on 02/06/2018 positive, titer: 3.60. Gave to Dr. Epimenio Foot to review.

## 2018-02-18 ENCOUNTER — Telehealth: Payer: Self-pay | Admitting: *Deleted

## 2018-02-18 NOTE — Telephone Encounter (Signed)
Initiated PA Vumerity on covermymeds. Key: [id number]. In process of completing.

## 2018-02-18 NOTE — Telephone Encounter (Signed)
Received fax notification from [employer] that PA denied/vumerity a plan exclusion. I emailed [employer] to see what next step would be/if patient qualifies for pt assistance or free drug. Waiting on response.

## 2018-02-18 NOTE — Telephone Encounter (Signed)
Submitted PA. Waiting on determination from [employer]. 

## 2018-02-19 NOTE — Telephone Encounter (Signed)
Submitted appeal to try and get vumerity approved. Waiting on determination.

## 2018-02-21 ENCOUNTER — Other Ambulatory Visit: Payer: Self-pay | Admitting: Nurse Practitioner

## 2018-02-21 DIAGNOSIS — I10 Essential (primary) hypertension: Secondary | ICD-10-CM

## 2018-02-21 NOTE — Telephone Encounter (Signed)
Her last K+ level was low.   Please advise on the HCTZ refill.

## 2018-02-21 NOTE — Telephone Encounter (Signed)
Her potassium was too low on December labs, we Need to her come by for BMET, this has already been ordered.

## 2018-02-25 ENCOUNTER — Telehealth: Payer: Self-pay | Admitting: *Deleted

## 2018-02-25 NOTE — Telephone Encounter (Signed)
I spoke with Carollee Herter and explained we were successful in enrolling one other pt. into the Vumerity free drug program today, when the denial from ins. is due to a plan exclusion. I explained I can make one final attempt to have her screened for the free drug program if she would like. She is agreeable. I will let her know as soon as I have an update from [employer] tomorrow/fim

## 2018-02-25 NOTE — Telephone Encounter (Signed)
Final attempt to have pt. screened/enrolled into [employer]'s free drug program for Vumerity. I spoke with Tacey Ruiz, supervisor in the financial assistance dept. at [employer] (phone# [phone]). I faxed the letter from [employer], stating Vumerity is a plan exclusion with this pt's ins. plan, to [employer] at [phone]. She will f/u with pt. and Angie, the case manager we have been communicating with in the last couple of weeks, to make sure they have everything they need to screen this pt. for the free drug program/fim

## 2018-02-25 NOTE — Telephone Encounter (Signed)
Spoke with Carollee Herter.  Vumerity is a plan exclusion with her insurance policy. She has not received Vumerity from the [facility] program. In order to start a dmt in a timely fashion, Dr. Epimenio Foot recommends she start Tecfidera. Amary is agreeable, will come into the office tomorrow to sign the Tecfidera srf/fim

## 2018-02-26 NOTE — Telephone Encounter (Signed)
Spoke with pt. this afternoon.  She has spoken with [employer] and has been enrolled into their free drug program for Vumerity/fim

## 2018-02-28 NOTE — Telephone Encounter (Signed)
Received fax notification from [employer] that Vumerity rx from free drug program has been shipped.

## 2018-03-31 ENCOUNTER — Telehealth: Payer: Self-pay | Admitting: Neurology

## 2018-03-31 NOTE — Telephone Encounter (Signed)
Pt would like a call back to get answers for her questions she has about having MS and the risks of the Corona Virus. She would like to know about the precautions she needs to be taking. Please advise.

## 2018-03-31 NOTE — Telephone Encounter (Signed)
I called back and addressed concerns. She will call back if any further questions.

## 2018-05-06 ENCOUNTER — Telehealth: Payer: Self-pay | Admitting: *Deleted

## 2018-05-06 NOTE — Telephone Encounter (Signed)
Called pt. She consented to virtual visit on 05/08/18 with Dr. Epimenio Foot. I scheduled/emailed pt at [email or web address]. Updated med list/pharamcy/allergy list. She will use iphone for virtual visit. Asked her to call back if she does not receive email.

## 2018-05-08 ENCOUNTER — Ambulatory Visit (INDEPENDENT_AMBULATORY_CARE_PROVIDER_SITE_OTHER): Payer: 59 | Admitting: Neurology

## 2018-05-08 ENCOUNTER — Encounter: Payer: Self-pay | Admitting: Neurology

## 2018-05-08 ENCOUNTER — Other Ambulatory Visit: Payer: Self-pay

## 2018-05-08 DIAGNOSIS — R200 Anesthesia of skin: Secondary | ICD-10-CM

## 2018-05-08 DIAGNOSIS — R350 Frequency of micturition: Secondary | ICD-10-CM | POA: Insufficient documentation

## 2018-05-08 DIAGNOSIS — G35 Multiple sclerosis: Secondary | ICD-10-CM | POA: Diagnosis not present

## 2018-05-08 NOTE — Progress Notes (Signed)
[facility]  PATIENT: Victoria Ryan DOB: 1981/01/01  REFERRING DOCTOR OR PCP: Referred by Alphonse Guild NP SOURCE: patient, Notes from [name], imaging and lab reports, MRI images were personally reviewed in Ms. Gebhard's presence  _________________________________   HISTORICAL  CHIEF COMPLAINT:  Chief Complaint  Patient presents with   Multiple Sclerosis    On Vumerity    HISTORY OF PRESENT ILLNESS:  Victoria Ryan is a 38 y.o. woman with MS diagnosed January 2020.  Update 05/08/2018: Virtual Visit via Video Note I connected with Victoria Ryan on 05/08/18 at  2:30 PM EDT by a video enabled telemedicine application and verified that I am speaking with the correct person.  I discussed the limitations of evaluation and management by telemedicine and the availability of in person appointments. The patient expressed understanding and agreed to proceed.  History of Present Illness: She feels her MS, diagnosed last year, is doing well.  She has no exacerbations or new neurologic symptoms.  She notes no problems with GI issues or flushing.      The numbness has resolved.  She is on gabapentin and feels it helped. She is walking well and is balancing well.   She can do heel to toe walking.  Strength is fine.  Vision is fine.  She notes some urinary urgency and frequency but no incontinence.     She feels her fatigue is doing better since starting the vitamin D supplements   Observations/Objective:  She is a well-developed well-nourished woman in no acute distress.  The head is normocephalic and atraumatic.  Sclera are anicteric.  Visible skin appears normal.  The neck has a good range of motion.  She is alert and fully oriented with fluent speech and good attention, knowledge and memory.  Extraocular muscles are intact.  Facial strength is normal.   She appears to have normal strength in the arms.  Rapid alternating movements and finger-nose-finger are  performed well.   Assessment and Plan: Multiple sclerosis (HCC)  Numbness  Urinary frequency   1.   Continue Vumerity.  At the next visit we will check a CBC with differential and we will also check an MRI of the brain by the end of the year to determine if she is having any subclinical progression.  If present, consider a different disease modifying therapy. 2.   Stay active and exercise as tolerated. 3.   Continue vitamin D supplements.  Follow Up Instructions: I discussed the assessment and treatment plan with the patient. The patient was provided an opportunity to ask questions and all were answered. The patient agreed with the plan and demonstrated an understanding of the instructions.    The patient was advised to call back or seek an in-person evaluation if the symptoms worsen or if the condition fails to improve as anticipated.  I provided 16 minutes of non-face-to-face time during this encounter.  _________ From previous visits Update 02/06/2018: She feels her numbness in the left face and right arm has improved but has not resolved.    She still has mild slurred speech but is better.    Balance is mildly off.   No falls  She had a contrasted MRI of the brain and the lesion showed peripheral enhancement with gadolinium.    No new lesions were seen.   She has 3 periventricular lesions, a couple other hemispheric lesions and the large left pontine acute lesion.    The spinal cord showed no lesions.  VEP was normal.    I reviewed the MRIs in her presence and pointed out the foci consistent with MS and the enhancement of the pontine focus.    We had a discussion of different disease modifying therapies.  She is presenting with possibly higher than average aggressiveness with a large infratentorial focus in the pons.  Fortunately she does not also have spinal cord lesions.  I would like her to be on a fairly effective disease modifying therapy.  We discussed Vumerity, Mayzent, Tysabri  and Ocrevus.  She was most interested in either Tysabri or Vumerity.     From 01/10/2019 She is a 38 yo woman who noted numbness on the right laeg and arm Monday 12/24/2017.   She was under a lot of stress and noted anxiety.   She saw her PCP and was told that symptoms would likely improve after af few days off.     She also noted her voice was mildly slurred and the left face was num (also milder right face).   She did not note diplopia.   She had no problems with eating or swallowing.   She noted that her handwriting and typing were off.     When symptoms persisted she had a brain MRI 12/30/2017.  I reviewed the report and personally reviewed the images.  The brain MRI showed a large focus in the left pons and a smaller subcortical focus in the left parietal lobe.  Additionally there appeared to be a small periventricular focus on the left.  She had a couple of more nonspecific appearing T2/flair hyperintense foci in the subcortical or deep white matter of the hemispheres.  She also noted some difficulty with her right vision.   She has mildly reduced acuity and altered color vision out of that eye.    Currently about 50-60% better than 2 weeks ago.      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   She is pre-diabetic.    No FH of MS or other autoimmune disorder.       REVIEW OF SYSTEMS: Constitutional: No fevers, chills, sweats, or change in appetite.  She has insomnia. Eyes: No visual changes, double vision, eye pain Ear, nose and throat: No hearing loss, ear pain, nasal congestion, sore throat Cardiovascular: No chest pain, palpitations Respiratory: No shortness of breath at rest or with exertion.   No wheezes GastrointestinaI: No nausea, vomiting, diarrhea, abdominal pain, fecal incontinence Genitourinary:Urinary frequency and urgency with occasional urge incontinence.   Musculoskeletal: No neck pain, back pain Integumentary: No rash, pruritus, skin  lesions Neurological: as above Psychiatric: No depression at this time.  No anxiety Endocrine: No palpitations, diaphoresis, change in appetite, change in weigh or increased thirst Hematologic/Lymphatic: No anemia, purpura, petechiae. Allergic/Immunologic: No itchy/runny eyes, nasal congestion, recent allergic reactions, rashes  ALLERGIES: No Known Allergies  HOME MEDICATIONS:  Current Outpatient Medications:    amLODipine (NORVASC) 10 MG tablet, TAKE 1 TABLET BY MOUTH ONCE DAILY, Disp: 90 tablet, Rfl: 1   Diroximel Fumarate (VUMERITY) 231 MG CPDR, Take 1 capsule by mouth 2 (two) times a day. , Disp: , Rfl:    gabapentin (NEURONTIN) 300 MG capsule, One po tid (Patient taking differently: Take 300 mg by mouth 3 (three) times daily. One po tid), Disp: 90 capsule, Rfl: 11   hydrochlorothiazide (HYDRODIURIL) 25 MG tablet, TAKE 1 TABLET BY MOUTH ONCE DAILY, Disp: 90 tablet, Rfl: 0   Vitamin D, Ergocalciferol, (DRISDOL) 1.25 MG (50000  UT) CAPS capsule, Take 1 capsule (50,000 Units total) by mouth every 7 (seven) days., Disp: 13 capsule, Rfl: 4  PAST MEDICAL HISTORY: Past Medical History:  Diagnosis Date   Anxiety    Chicken pox    Hypertension     PAST SURGICAL HISTORY: Past Surgical History:  Procedure Laterality Date   CESAREAN SECTION     x2    FAMILY HISTORY: Family History  Problem Relation Age of Onset   Hypertension Mother    Heart disease Father    Hyperlipidemia Father    Hypertension Father    Breast cancer Paternal Grandmother    Cancer Neg Hx     SOCIAL HISTORY:  Social History   Socioeconomic History   Marital status: Married    Spouse name: Debbora LacrosseMichael Barfield   Number of children: 2   Years of education: 14   Highest education level: Not on file  Occupational History   Occupation: Clinical biochemistCustomer Service Rep  [employer]ologistocial Needs   Financial resource strain: Not on file   Food insecurity:    Worry: Not on file    Inability: Not on file    Transportation needs:    Medical: Not on file    Non-medical: Not on file  Tobacco Use   Smoking status: Former Smoker   Smokeless tobacco: Never Used  Substance and Sexual Activity   Alcohol use: Yes    Comment: occasionally wine   Drug use: No   Sexual activity: Yes    Birth control/protection: None  Lifestyle   Physical activity:    Days per week: Not on file    Minutes per session: Not on file   Stress: Not on file  Relationships   Social connections:    Talks on phone: Not on file    Gets together: Not on file    Attends religious service: Not on file    Active member of club or organization: Not on file    Attends meetings of clubs or organizations: Not on file    Relationship status: Not on file   Intimate partner violence:    Fear of current or ex partner: Not on file    Emotionally abused: Not on file    Physically abused: Not on file    Forced sexual activity: Not on file  Other Topics Concern   Not on file  Social History Narrative   Lives w/ husband and kids   Regular exercise-yes   Caffeine Use-yes   Right handed      PHYSICAL EXAM  There were no vitals filed for this visit.  There is no height or weight on file to calculate BMI.   General: The patient is well-developed and well-nourished and in no acute distress   Neurologic Exam  Mental status: The patient is alert and oriented x 3 at the time of the examination. The patient has apparent normal recent and remote memory, with an apparently normal attention span and concentration ability.   Speech is normal.  Cranial nerves: Extraocular movements are full.    Facial strength is normal.  Trapezius strength is normal.  The tongue is midline, and the patient has symmetric elevation of the soft palate. No obvious hearing deficits are noted.  Motor: Normal muscle tone and bulk.  Normal strength.  Sensory: Intact sensation to touch  Coordination: Cerebellar shows good finger-nose-finger and  heel-to-shin  Gait and station: Station is normal.   Gait is normal. Tandem gait is mildly wide.   Reflexes: Deep  tendon reflexes are symmetric and normal bilaterally.      DIAGNOSTIC DATA (LABS, IMAGING, TESTING) - I reviewed patient records, labs, notes, testing and imaging myself where available.  Lab Results  Component Value Date   WBC 8.6 02/06/2018   HGB 15.3 02/06/2018   HCT 44.5 02/06/2018   MCV 88 02/06/2018   PLT 343 02/06/2018      Component Value Date/Time   NA 137 12/23/2017 1118   K 3.3 (L) 12/23/2017 1118   CL 99 12/23/2017 1118   CO2 27 12/23/2017 1118   GLUCOSE 115 (H) 12/23/2017 1118   BUN 22 12/23/2017 1118   CREATININE 0.77 12/23/2017 1118   CALCIUM 9.7 12/23/2017 1118   PROT 7.3 02/06/2018 1207   ALBUMIN 4.7 02/06/2018 1207   AST 20 02/06/2018 1207   ALT 20 02/06/2018 1207   ALKPHOS 100 02/06/2018 1207   BILITOT 0.3 02/06/2018 1207   GFRNONAA >60 04/24/2016 1329   GFRAA >60 04/24/2016 1329   Lab Results  Component Value Date   CHOL 273 (H) 08/22/2017   HDL 44.60 08/22/2017   LDLCALC 215 (H) 02/21/2017   LDLDIRECT 201.0 08/22/2017   TRIG 203.0 (H) 08/22/2017   CHOLHDL 6 08/22/2017   Lab Results  Component Value Date   HGBA1C 6.2 08/22/2017   Lab Results  Component Value Date   VITAMINB12 307 12/23/2017   Lab Results  Component Value Date   TSH 1.05 12/23/2017       ASSESSMENT AND PLAN  Multiple sclerosis (HCC)  Numbness  Urinary frequency   Jahel Wavra A. Epimenio Foot, MD, PhD, FAAN Certified in Neurology, Clinical Neurophysiology, Sleep Medicine, Pain Medicine and Neuroimaging Director, [facility]  [facility] [address], [address] [place], [place] [address] [phone]

## 2018-05-28 ENCOUNTER — Other Ambulatory Visit: Payer: Self-pay | Admitting: *Deleted

## 2018-05-28 DIAGNOSIS — I10 Essential (primary) hypertension: Secondary | ICD-10-CM

## 2018-05-28 MED ORDER — HYDROCHLOROTHIAZIDE 25 MG PO TABS: 25.0000 mg | ORAL_TABLET | Freq: Every day | ORAL | 0 refills | Status: DC

## 2018-06-05 ENCOUNTER — Telehealth: Payer: Self-pay | Admitting: *Deleted

## 2018-06-05 MED ORDER — HYDROXYZINE PAMOATE 25 MG PO CAPS: ORAL_CAPSULE | ORAL | 2 refills | Status: DC

## 2018-06-05 NOTE — Telephone Encounter (Signed)
I called pt back. Relayed Dr. Bonnita Hollow message. She thinks it may be her laundry detergent and will look into this. She denies starting any other new medications recently. She would still like rx vistaril called into the pharmacy to take if she needs. Asked her to call back if sx do not improve. She verbalized understanding.

## 2018-06-05 NOTE — Telephone Encounter (Signed)
Its unusual for itching to start so long after starting the medication so could be due to something else (I.e. a ifferent laundry detergent.   However, please check to see if she is taking with food we can also send in Vistaril 25 mg (antihistamine) to take up to tid #90 #2

## 2018-06-05 NOTE — Telephone Encounter (Signed)
Took call from Noank in phone room. Pt feels she is having SE from Vumerity. She started medication about about 5 months but itching started this past Monday. She is getting a rash from itching. Advised I will ask Dr. Epimenio Foot and call back to let her know next steps. She verbalized understanding.

## 2018-06-25 ENCOUNTER — Other Ambulatory Visit: Payer: Self-pay | Admitting: *Deleted

## 2018-06-25 DIAGNOSIS — I10 Essential (primary) hypertension: Secondary | ICD-10-CM

## 2018-06-25 MED ORDER — AMLODIPINE BESYLATE 10 MG PO TABS: 10.0000 mg | ORAL_TABLET | Freq: Every day | ORAL | 0 refills | Status: DC

## 2018-07-04 ENCOUNTER — Other Ambulatory Visit: Payer: Self-pay

## 2018-07-23 ENCOUNTER — Telehealth: Payer: Self-pay | Admitting: Neurology

## 2018-07-23 NOTE — Telephone Encounter (Signed)
Patient called and stated she just found out she is pregnant so she wants to speak with the nurse about changes she may need to make with her medications. Please call and advise.

## 2018-07-23 NOTE — Telephone Encounter (Signed)
She can stop the Vumerity and gabapentin.   MS usually becomes les active during prgnanacy.  She should continue to do her follow up appts and I will discuss plans to get back on medication after delivery when she comes in for the next appt.

## 2018-07-23 NOTE — Telephone Encounter (Signed)
Dr. Sater- please advise 

## 2018-07-23 NOTE — Telephone Encounter (Signed)
Called pt. She will stop vumerity and gabapentin. Took off medication list.  She has f/u with Dr. Felecia Shelling 09/11/18 at 230pm.

## 2018-07-29 ENCOUNTER — Ambulatory Visit (INDEPENDENT_AMBULATORY_CARE_PROVIDER_SITE_OTHER): Payer: 59 | Admitting: Internal Medicine

## 2018-07-29 ENCOUNTER — Other Ambulatory Visit: Payer: Self-pay

## 2018-07-29 ENCOUNTER — Encounter: Payer: Self-pay | Admitting: Internal Medicine

## 2018-07-29 ENCOUNTER — Other Ambulatory Visit (INDEPENDENT_AMBULATORY_CARE_PROVIDER_SITE_OTHER): Payer: 59

## 2018-07-29 VITALS — BP 126/88 | HR 117 | Temp 98.2°F | Ht 64.0 in | Wt 183.0 lb

## 2018-07-29 DIAGNOSIS — E538 Deficiency of other specified B group vitamins: Secondary | ICD-10-CM

## 2018-07-29 DIAGNOSIS — I10 Essential (primary) hypertension: Secondary | ICD-10-CM | POA: Diagnosis not present

## 2018-07-29 DIAGNOSIS — R771 Abnormality of globulin: Secondary | ICD-10-CM

## 2018-07-29 DIAGNOSIS — E611 Iron deficiency: Secondary | ICD-10-CM

## 2018-07-29 DIAGNOSIS — E559 Vitamin D deficiency, unspecified: Secondary | ICD-10-CM | POA: Diagnosis not present

## 2018-07-29 DIAGNOSIS — Z0000 Encounter for general adult medical examination without abnormal findings: Secondary | ICD-10-CM | POA: Diagnosis not present

## 2018-07-29 DIAGNOSIS — R739 Hyperglycemia, unspecified: Secondary | ICD-10-CM

## 2018-07-29 LAB — URINALYSIS, ROUTINE W REFLEX MICROSCOPIC
Bilirubin Urine: NEGATIVE
Hgb urine dipstick: NEGATIVE
Ketones, ur: NEGATIVE
Nitrite: NEGATIVE
RBC / HPF: NONE SEEN (ref 0–?)
Specific Gravity, Urine: 1.02 (ref 1.000–1.030)
Total Protein, Urine: NEGATIVE
Urine Glucose: NEGATIVE
Urobilinogen, UA: 1 (ref 0.0–1.0)
pH: 8 (ref 5.0–8.0)

## 2018-07-29 LAB — BASIC METABOLIC PANEL
BUN: 14 mg/dL (ref 6–23)
CO2: 26 mEq/L (ref 19–32)
Calcium: 9.5 mg/dL (ref 8.4–10.5)
Chloride: 97 mEq/L (ref 96–112)
Creatinine, Ser: 0.62 mg/dL (ref 0.40–1.20)
GFR: 130.09 mL/min (ref >60.00)
Glucose, Bld: 102 mg/dL — ABNORMAL HIGH (ref 70–99)
Potassium: 3.2 mEq/L — ABNORMAL LOW (ref 3.5–5.1)
Sodium: 132 mEq/L — ABNORMAL LOW (ref 135–145)

## 2018-07-29 LAB — CBC WITH DIFFERENTIAL/PLATELET
Basophils Absolute: 0.1 10*3/uL (ref 0.0–0.1)
Basophils Relative: 0.7 % (ref 0.0–3.0)
Eosinophils Absolute: 0.1 10*3/uL (ref 0.0–0.7)
Eosinophils Relative: 0.4 % (ref 0.0–5.0)
HCT: 42.7 % (ref 36.0–46.0)
Hemoglobin: 14.6 g/dL (ref 12.0–15.0)
Lymphocytes Relative: 23.9 % (ref 12.0–46.0)
Lymphs Abs: 3.4 10*3/uL (ref 0.7–4.0)
MCHC: 34.1 g/dL (ref 30.0–36.0)
MCV: 89.4 fl (ref 78.0–100.0)
Monocytes Absolute: 1.1 10*3/uL — ABNORMAL HIGH (ref 0.1–1.0)
Monocytes Relative: 8.1 % (ref 3.0–12.0)
Neutro Abs: 9.5 10*3/uL — ABNORMAL HIGH (ref 1.4–7.7)
Neutrophils Relative %: 66.9 % (ref 43.0–77.0)
Platelets: 379 10*3/uL (ref 150.0–400.0)
RBC: 4.78 Mil/uL (ref 3.87–5.11)
RDW: 13.1 % (ref 11.5–15.5)
WBC: 14.2 10*3/uL — ABNORMAL HIGH (ref 4.0–10.5)

## 2018-07-29 LAB — LIPID PANEL
Cholesterol: 182 mg/dL (ref 0–200)
HDL: 49.2 mg/dL (ref >39.00)
NonHDL: 132.6
Total CHOL/HDL Ratio: 4
Triglycerides: 215 mg/dL — ABNORMAL HIGH (ref 0.0–149.0)
VLDL: 43 mg/dL — ABNORMAL HIGH (ref 0.0–40.0)

## 2018-07-29 LAB — HEPATIC FUNCTION PANEL
ALT: 40 U/L — ABNORMAL HIGH (ref 0–35)
AST: 14 U/L (ref 0–37)
Albumin: 4.5 g/dL (ref 3.5–5.2)
Alkaline Phosphatase: 72 U/L (ref 39–117)
Bilirubin, Direct: 0 mg/dL (ref 0.0–0.3)
Total Bilirubin: 0.3 mg/dL (ref 0.2–1.2)
Total Protein: 7.6 g/dL (ref 6.0–8.3)

## 2018-07-29 LAB — VITAMIN B12: Vitamin B-12: 252 pg/mL (ref 211–911)

## 2018-07-29 LAB — VITAMIN D 25 HYDROXY (VIT D DEFICIENCY, FRACTURES): VITD: 39.22 ng/mL (ref 30.00–100.00)

## 2018-07-29 LAB — LDL CHOLESTEROL, DIRECT: Direct LDL: 124 mg/dL

## 2018-07-29 LAB — TSH: TSH: 0.15 u[IU]/mL — ABNORMAL LOW (ref 0.35–4.50)

## 2018-07-29 LAB — IBC PANEL
Iron: 97 ug/dL (ref 42–145)
Saturation Ratios: 19.7 % — ABNORMAL LOW (ref 20.0–50.0)
Transferrin: 351 mg/dL (ref 212.0–360.0)

## 2018-07-29 LAB — HEMOGLOBIN A1C: Hgb A1c MFr Bld: 5.7 % (ref 4.6–6.5)

## 2018-07-29 MED ORDER — LABETALOL HCL 100 MG PO TABS: 100.0000 mg | ORAL_TABLET | Freq: Two times a day (BID) | ORAL | 3 refills | Status: DC

## 2018-07-29 NOTE — Assessment & Plan Note (Signed)
Newly pregnant, for change hct and amlodipine to labetolol 100 bid, check BP with next GYN appt and at home and next visit

## 2018-07-29 NOTE — Assessment & Plan Note (Signed)

## 2018-07-29 NOTE — Assessment & Plan Note (Signed)
For fu lab,  to f/u any worsening symptoms or concerns  

## 2018-07-29 NOTE — Patient Instructions (Signed)
Ok to stop the amlodipine and the HCT fluid pill  Please take all new medication as prescribed - the lower dose labetolol  Please check your BP with your GYN next week and at home and call for BP's more often than not > 130-140/90.    Please continue all other medications as before, and refills have been done if requested.  Please have the pharmacy call with any other refills you may need.  Please continue your efforts at being more active, low cholesterol diet, and weight control.  You are otherwise up to date with prevention measures today.  Please keep your appointments with your specialists as you may have planned  Please go to the LAB in the Basement (turn left off the elevator) for the tests to be done today  You will be contacted by phone if any changes need to be made immediately.  Otherwise, you will receive a letter about your results with an explanation, but please check with MyChart first.  Please remember to sign up for MyChart if you have not done so, as this will be important to you in the future with finding out test results, communicating by private email, and scheduling acute appointments online when needed.  Please return in 1 year for your yearly visit, or sooner if needed, with Lab testing done 3-5 days before  Good Luck with the Pregnancy!

## 2018-07-29 NOTE — Progress Notes (Signed)
Subjective:    Patient ID: Victoria Ryan, female    DOB: [birth year], 38 y.o.   MRN: [id number]  HPI  Here for wellness and f/u;  Overall doing ok;  Pt denies Chest pain, worsening SOB, DOE, wheezing, orthopnea, PND, worsening LE edema, palpitations, dizziness or syncope.  Pt denies neurological change such as new headache, facial or extremity weakness.  Pt denies polydipsia, polyuria, or low sugar symptoms. Pt states overall good compliance with treatment and medications, good tolerability, and has been trying to follow appropriate diet.  Pt denies worsening depressive symptoms, suicidal ideation or panic. No fever, night sweats, wt loss, loss of appetite, or other constitutional symptoms.  Pt states good ability with ADL's, has low fall risk, home safety reviewed and adequate, no other significant changes in hearing or vision, and only occasionally active with exercise. Newly pregnant about 7-8 wks, now off MS meds, has neurology f/u aug 2020, and GYN next wk.  No other new complaints Past Medical History:  Diagnosis Date  . Anxiety   . Chicken pox   . Hypertension    Past Surgical History:  Procedure Laterality Date  . CESAREAN SECTION     x2    reports that she has quit smoking. She has never used smokeless tobacco. She reports current alcohol use. She reports that she does not use drugs. family history includes Breast cancer in her paternal grandmother; Heart disease in her father; Hyperlipidemia in her father; Hypertension in her father and mother. No Known Allergies Current Outpatient Medications on File Prior to Visit  Medication Sig Dispense Refill  . hydrOXYzine (VISTARIL) 25 MG capsule Take up to three times daily by mouth as needed 90 capsule 2  . Vitamin D, Ergocalciferol, (DRISDOL) 1.25 MG (50000 UT) CAPS capsule Take 1 capsule (50,000 Units total) by mouth every 7 (seven) days. 13 capsule 4   No current facility-administered medications on file prior to visit.    Review  of Systems Constitutional: Negative for other unusual diaphoresis, sweats, appetite or weight changes HENT: Negative for other worsening hearing loss, ear pain, facial swelling, mouth sores or neck stiffness.   Eyes: Negative for other worsening pain, redness or other visual disturbance.  Respiratory: Negative for other stridor or swelling Cardiovascular: Negative for other palpitations or other chest pain  Gastrointestinal: Negative for worsening diarrhea or loose stools, blood in stool, distention or other pain Genitourinary: Negative for hematuria, flank pain or other change in urine volume.  Musculoskeletal: Negative for myalgias or other joint swelling.  Skin: Negative for other color change, or other wound or worsening drainage.  Neurological: Negative for other syncope or numbness. Hematological: Negative for other adenopathy or swelling Psychiatric/Behavioral: Negative for hallucinations, other worsening agitation, SI, self-injury, or new decreased concentration All other system neg per pt    Objective:   Physical Exam BP 126/88   Pulse (!) 117   Temp 98.2 F (36.8 C) (Oral)   Ht 5\' 4"  (1.626 m)   Wt 183 lb (83 kg)   SpO2 98%   BMI 31.41 kg/m  VS noted,  Constitutional: Pt is oriented to person, place, and time. Appears well-developed and well-nourished, in no significant distress and comfortable Head: Normocephalic and atraumatic  Eyes: Conjunctivae and EOM are normal. Pupils are equal, round, and reactive to light Right Ear: External ear normal without discharge Left Ear: External ear normal without discharge Nose: Nose without discharge or deformity Mouth/Throat: Oropharynx is without other ulcerations and moist  Neck: Normal range  of motion. Neck supple. No JVD present. No tracheal deviation present or significant neck LA or mass Cardiovascular: Normal rate, regular rhythm, normal heart sounds and intact distal pulses.   Pulmonary/Chest: WOB normal and breath sounds  without rales or wheezing  Abdominal: Soft. Bowel sounds are normal. NT. No HSM  Musculoskeletal: Normal range of motion. Exhibits no edema Lymphadenopathy: Has no other cervical adenopathy.  Neurological: Pt is alert and oriented to person, place, and time. Pt has normal reflexes. No cranial nerve deficit. Motor grossly intact, Gait intact Skin: Skin is warm and dry. No rash noted or new ulcerations Psychiatric:  Has normal mood and affect. Behavior is normal without agitation No other exam findings Lab Results  Component Value Date   WBC 14.2 (H) 07/29/2018   HGB 14.6 07/29/2018   HCT 42.7 07/29/2018   PLT 379.0 07/29/2018   GLUCOSE 102 (H) 07/29/2018   CHOL 182 07/29/2018   TRIG 215.0 (H) 07/29/2018   HDL 49.20 07/29/2018   LDLDIRECT 124.0 07/29/2018   LDLCALC 215 (H) 02/21/2017   ALT 40 (H) 07/29/2018   AST 14 07/29/2018   NA 132 (L) 07/29/2018   K 3.2 (L) 07/29/2018   CL 97 07/29/2018   CREATININE 0.62 07/29/2018   BUN 14 07/29/2018   CO2 26 07/29/2018   TSH 0.15 (L) 07/29/2018   HGBA1C 5.7 07/29/2018       Assessment & Plan:

## 2018-07-30 ENCOUNTER — Other Ambulatory Visit: Payer: Self-pay | Admitting: Internal Medicine

## 2018-07-30 DIAGNOSIS — R7989 Other specified abnormal findings of blood chemistry: Secondary | ICD-10-CM

## 2018-07-30 MED ORDER — POTASSIUM CHLORIDE ER 10 MEQ PO TBCR: 20.0000 meq | EXTENDED_RELEASE_TABLET | Freq: Every day | ORAL | 0 refills | Status: DC

## 2018-07-31 ENCOUNTER — Telehealth: Payer: Self-pay

## 2018-07-31 NOTE — Telephone Encounter (Signed)
Pt has viewed results via MyChart  

## 2018-07-31 NOTE — Telephone Encounter (Signed)
-----   Message from Biagio Borg, MD sent at 07/30/2018  9:08 PM EDT ----- Left message on MyChart, pt to cont same tx except  The test results show that your current treatment is OK, except he potassium is mildly low, most likely due to the recent fluid pill that we discontinued.  We should treat with a short course of potassium to build it back up.    A surprise finding however was that the thyroid test is consistent possible overactive thyroid.  We will need to refer to Endocrinology for further consideration.  Shirron to please inform pt, I will do referral and rx

## 2018-08-28 ENCOUNTER — Encounter (HOSPITAL_COMMUNITY): Payer: Self-pay

## 2018-08-28 ENCOUNTER — Ambulatory Visit: Payer: 59 | Admitting: Internal Medicine

## 2018-08-28 DIAGNOSIS — Z3490 Encounter for supervision of normal pregnancy, unspecified, unspecified trimester: Secondary | ICD-10-CM | POA: Insufficient documentation

## 2018-09-01 ENCOUNTER — Other Ambulatory Visit: Payer: Self-pay

## 2018-09-02 ENCOUNTER — Telehealth: Payer: Self-pay | Admitting: Internal Medicine

## 2018-09-02 NOTE — Telephone Encounter (Signed)
Please advise 

## 2018-09-02 NOTE — Telephone Encounter (Signed)
FYI

## 2018-09-02 NOTE — Telephone Encounter (Signed)
Patient called re: Patient is not sure if she needs the appointment scheduled for 09/03/18 because patient had her labs done and her TSH came back normal (0.711). Patient is cancelling the above appointment but if Dr. Kelton Pillar feels that patient needs to see DrKelton Pillar please call patient at ph# [phone] to advise.

## 2018-09-03 ENCOUNTER — Ambulatory Visit: Payer: 59 | Admitting: Internal Medicine

## 2018-09-11 ENCOUNTER — Ambulatory Visit (INDEPENDENT_AMBULATORY_CARE_PROVIDER_SITE_OTHER): Payer: 59 | Admitting: Neurology

## 2018-09-11 ENCOUNTER — Encounter: Payer: Self-pay | Admitting: Neurology

## 2018-09-11 ENCOUNTER — Other Ambulatory Visit: Payer: Self-pay

## 2018-09-11 VITALS — BP 140/96 | HR 103 | Temp 97.5°F | Ht 64.0 in | Wt 199.5 lb

## 2018-09-11 DIAGNOSIS — R260 Ataxic gait: Secondary | ICD-10-CM

## 2018-09-11 DIAGNOSIS — F32A Depression, unspecified: Secondary | ICD-10-CM

## 2018-09-11 DIAGNOSIS — F419 Anxiety disorder, unspecified: Secondary | ICD-10-CM | POA: Diagnosis not present

## 2018-09-11 DIAGNOSIS — G35 Multiple sclerosis: Secondary | ICD-10-CM

## 2018-09-11 DIAGNOSIS — F329 Major depressive disorder, single episode, unspecified: Secondary | ICD-10-CM

## 2018-09-11 NOTE — Progress Notes (Signed)
[facility]  PATIENT: Victoria Ryan DOB: 1980-04-26  REFERRING DOCTOR OR PCP: Referred by Alphonse GuildAshleigh Shambley NP SOURCE: patient, Notes from [name], imaging and lab reports, MRI images were personally reviewed in Ms. Westergard's presence  _________________________________   HISTORICAL  CHIEF COMPLAINT:  Chief Complaint  Patient presents with  . Follow-up    RM 12, alone. Last seen 05/08/18  . Multiple Sclerosis    Off Vumerity and gabapentin d/t recent pregnancy. She will be 14 weeks tomorrow    HISTORY OF PRESENT ILLNESS:  Victoria Ryan is a 38 y.o. woman with MS diagnosed January 2020.  Update 09/11/2018: Victoria HerterShannon stopped the Vumerity and gabapentin last month after discovering that she was pregnant.  She is 13 to 14 weeks.  She feels her MS has been stable and she notes no new symptoms.   Gait, strength, sensation are doing well.  Bladder function is fine.       She is planning on breastfeeding about 3-4 months.   We discussed that the pills are generally less compatible with breastfeeding than the IV or SQ/IM shot DMT.     Fatigue is about the same.    Mood is doing okay..  She is not on any medication for anxiety and depression that she has noted in the past.  [facility]     Update 05/08/2018 (Virtual) She feels her MS, diagnosed last year, is doing well.  She has no exacerbations or new neurologic symptoms.  She notes no problems with GI issues or flushing.      The numbness has resolved.  She is on gabapentin and feels it helped. She is walking well and is balancing well.   She can do heel to toe walking.  Strength is fine.  Vision is fine.  She notes some urinary urgency and frequency but no incontinence.     She feels her fatigue is doing better since starting the vitamin D supplements   Update 02/06/2018: She feels her numbness in the left face and right arm has improved but has not resolved.    She  still has mild slurred speech but is better.    Balance is mildly off.   No falls  She had a contrasted MRI of the brain and the lesion showed peripheral enhancement with gadolinium.    No new lesions were seen.   She has 3 periventricular lesions, a couple other hemispheric lesions and the large left pontine acute lesion.    The spinal cord showed no lesions.   VEP was normal.    I reviewed the MRIs in her presence and pointed out the foci consistent with MS and the enhancement of the pontine focus.    We had a discussion of different disease modifying therapies.  She is presenting with possibly higher than average aggressiveness with a large infratentorial focus in the pons.  Fortunately she does not also have spinal cord lesions.  I would like her to be on a fairly effective disease modifying therapy.  We discussed Vumerity, Mayzent, Tysabri and Ocrevus.  She was most interested in either Tysabri or Vumerity.     From 01/10/2019 She is a 38 yo woman who noted numbness on the right laeg and arm Monday 12/24/2017.   She was under a lot of stress and noted anxiety.   She saw her PCP and was told that symptoms would likely improve after af few days off.  She also noted her voice was mildly slurred and the left face was num (also milder right face).   She did not note diplopia.   She had no problems with eating or swallowing.   She noted that her handwriting and typing were off.     When symptoms persisted she had a brain MRI 12/30/2017.  I reviewed the report and personally reviewed the images.  The brain MRI showed a large focus in the left pons and a smaller subcortical focus in the left parietal lobe.  Additionally there appeared to be a small periventricular focus on the left.  She had a couple of more nonspecific appearing T2/flair hyperintense foci in the subcortical or deep white matter of the hemispheres.  She also noted some difficulty with her right vision.   She has mildly reduced acuity and  altered color vision out of that eye.    Currently about 50-60% better than 2 weeks ago.      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   She is pre-diabetic.    No FH of MS or other autoimmune disorder.       REVIEW OF SYSTEMS: Constitutional: No fevers, chills, sweats, or change in appetite.  She has insomnia. Eyes: No visual changes, double vision, eye pain Ear, nose and throat: No hearing loss, ear pain, nasal congestion, sore throat Cardiovascular: No chest pain, palpitations Respiratory: No shortness of breath at rest or with exertion.   No wheezes GastrointestinaI: No nausea, vomiting, diarrhea, abdominal pain, fecal incontinence Genitourinary:Urinary frequency and urgency with occasional urge incontinence.   Musculoskeletal: No neck pain, back pain Integumentary: No rash, pruritus, skin lesions Neurological: as above Psychiatric: No depression at this time.  No anxiety Endocrine: No palpitations, diaphoresis, change in appetite, change in weigh or increased thirst Hematologic/Lymphatic: No anemia, purpura, petechiae. Allergic/Immunologic: No itchy/runny eyes, nasal congestion, recent allergic reactions, rashes  ALLERGIES: No Known Allergies  HOME MEDICATIONS:  Current Outpatient Medications:  .  FOLIC ACID PO, Take by mouth., Disp: , Rfl:  .  labetalol (NORMODYNE) 100 MG tablet, Take 1 tablet (100 mg total) by mouth 2 (two) times daily., Disp: 180 tablet, Rfl: 3 .  Prenatal Multivit-Min-Fe-FA (PRE-NATAL PO), Take by mouth., Disp: , Rfl:  .  potassium chloride (KLOR-CON 10) 10 MEQ tablet, Take 2 tablets (20 mEq total) by mouth daily for 4 days., Disp: 8 tablet, Rfl: 0  PAST MEDICAL HISTORY: Past Medical History:  Diagnosis Date  . Anxiety   . Chicken pox   . Hypertension     PAST SURGICAL HISTORY: Past Surgical History:  Procedure Laterality Date  . CESAREAN SECTION     x2    FAMILY HISTORY: Family History  Problem Relation Age  of Onset  . Hypertension Mother   . Heart disease Father   . Hyperlipidemia Father   . Hypertension Father   . Breast cancer Paternal Grandmother   . Cancer Neg Hx     SOCIAL HISTORY:  Social History   Socioeconomic History  . Marital status: Married    Spouse name: Blima Ledger  . Number of children: 2  . Years of education: 18  . Highest education level: Not on file  Occupational History  . Occupation: Therapist, art Rep  Social Needs  . Financial resource strain: Not on file  . Food insecurity    Worry: Not on file    Inability: Not on file  . Transportation needs    Medical: Not  on file    Non-medical: Not on file  Tobacco Use  . Smoking status: Former Games developermoker  . Smokeless tobacco: Never Used  Substance and Sexual Activity  . Alcohol use: Yes    Comment: occasionally wine  . Drug use: No  . Sexual activity: Yes    Birth control/protection: None  Lifestyle  . Physical activity    Days per week: Not on file    Minutes per session: Not on file  . Stress: Not on file  Relationships  . Social Musicianconnections    Talks on phone: Not on file    Gets together: Not on file    Attends religious service: Not on file    Active member of club or organization: Not on file    Attends meetings of clubs or organizations: Not on file    Relationship status: Not on file  . Intimate partner violence    Fear of current or ex partner: Not on file    Emotionally abused: Not on file    Physically abused: Not on file    Forced sexual activity: Not on file  Other Topics Concern  . Not on file  Social History Narrative   Lives w/ husband and kids   Regular exercise-yes   Caffeine Use-yes   Right handed      PHYSICAL EXAM  Vitals:   09/11/18 1417  BP: (!) 140/96  Pulse: (!) 103  Temp: (!) 97.5 F (36.4 C)  Weight: 199 lb 8 oz (90.5 kg)  Height: 5\' 4"  (1.626 m)    Body mass index is 34.24 kg/m.   General: The patient is well-developed and well-nourished and in  no acute distress   Neurologic Exam  Mental status: The patient is alert and oriented x 3 at the time of the examination. The patient has apparent normal recent and remote memory, with an apparently normal attention span and concentration ability.   Speech is normal.  Cranial nerves: Extraocular movements are full.    Facial strength is normal.  Trapezius strength is normal. Hearing appears normal. .  Motor: Normal muscle tone and bulk.  Normal strength.  Sensory: Intact sensation to touch in arms and legs  Coordination: Cerebellar shows good finger-nose-finger and heel-to-shin  Gait and station: Station is normal.   Gait is normal. Tandem gait is mildly wide.   Reflexes: Deep tendon reflexes are symmetric and normal bilaterally.      DIAGNOSTIC DATA (LABS, IMAGING, TESTING) - I reviewed patient records, labs, notes, testing and imaging myself where available.  Lab Results  Component Value Date   WBC 14.2 (H) 07/29/2018   HGB 14.6 07/29/2018   HCT 42.7 07/29/2018   MCV 89.4 07/29/2018   PLT 379.0 07/29/2018      Component Value Date/Time   NA 132 (L) 07/29/2018 1435   K 3.2 (L) 07/29/2018 1435   CL 97 07/29/2018 1435   CO2 26 07/29/2018 1435   GLUCOSE 102 (H) 07/29/2018 1435   BUN 14 07/29/2018 1435   CREATININE 0.62 07/29/2018 1435   CALCIUM 9.5 07/29/2018 1435   PROT 7.6 07/29/2018 1435   PROT 7.3 02/06/2018 1207   ALBUMIN 4.5 07/29/2018 1435   ALBUMIN 4.7 02/06/2018 1207   AST 14 07/29/2018 1435   ALT 40 (H) 07/29/2018 1435   ALKPHOS 72 07/29/2018 1435   BILITOT 0.3 07/29/2018 1435   BILITOT 0.3 02/06/2018 1207   GFRNONAA >60 04/24/2016 1329   GFRAA >60 04/24/2016 1329   Lab Results  Component Value Date   CHOL 182 07/29/2018   HDL 49.20 07/29/2018   LDLCALC 215 (H) 02/21/2017   LDLDIRECT 124.0 07/29/2018   TRIG 215.0 (H) 07/29/2018   CHOLHDL 4 07/29/2018   Lab Results  Component Value Date   HGBA1C 5.7 07/29/2018   Lab Results  Component Value  Date   VITAMINB12 252 07/29/2018   Lab Results  Component Value Date   TSH 0.15 (L) 07/29/2018       ASSESSMENT AND PLAN   No diagnosis found.  1.   She is 13-[redacted] weeks pregnant.   We had a long discussion about MS and pregnancy being somewhat protective.    Effects of her Vumerity will be wearing off soon.  If she has a relapse, consider starting Glatiramer or Interferon. 2.   She plans on breastfeeding x 3-4 months.  We will likely see her one month after delivery and plan a DMT restart then.   If she decides to breastfeed > 4 months, consider glatiramer, interferon, Tysabri or Natalizumab  Devesh Monforte A. Epimenio Foot, MD, PhD, FAAN Certified in Neurology, Clinical Neurophysiology, Sleep Medicine, Pain Medicine and Neuroimaging Director, [facility]  [facility] [address], [address] [place], [place] [address] [phone]

## 2018-09-24 ENCOUNTER — Other Ambulatory Visit: Payer: Self-pay

## 2018-09-29 ENCOUNTER — Telehealth: Payer: Self-pay | Admitting: *Deleted

## 2018-09-29 NOTE — Telephone Encounter (Signed)
Sent mychart message

## 2018-10-13 ENCOUNTER — Other Ambulatory Visit: Payer: Self-pay

## 2018-10-13 ENCOUNTER — Ambulatory Visit (HOSPITAL_COMMUNITY): Payer: Self-pay | Admitting: Genetic Counselor

## 2018-10-13 ENCOUNTER — Ambulatory Visit (HOSPITAL_COMMUNITY): Payer: 59 | Attending: Obstetrics and Gynecology | Admitting: Genetic Counselor

## 2018-10-13 DIAGNOSIS — Z315 Encounter for genetic counseling: Secondary | ICD-10-CM

## 2018-10-13 DIAGNOSIS — Z31430 Encounter of female for testing for genetic disease carrier status for procreative management: Secondary | ICD-10-CM

## 2018-10-13 DIAGNOSIS — Z3A18 18 weeks gestation of pregnancy: Secondary | ICD-10-CM

## 2018-10-13 DIAGNOSIS — Z148 Genetic carrier of other disease: Secondary | ICD-10-CM

## 2018-10-13 NOTE — Progress Notes (Signed)
10/13/2018  Kyra Searles Mcgaugh 04/08/1980 MRN: [id number] DOV: 10/13/2018  Ms. 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 Ms. Fabre came to her appointment alone due to COVID-19 visitor restrictions.   Indication for genetic counseling - Increased risk to be silent carrier for spinal muscular atrophy (SMA)  Prenatal history  Ms. Ferrer is a [id number], 38 y.o. female. Her current pregnancy has completed [redacted]w[redacted]d (Estimated Date of Delivery: None noted.).  Ms. Nicolls denied exposure to environmental toxins or chemical agents. She denied the use of alcohol, tobacco or street drugs. She reported taking Labetalol and prenatal vitamins. Per records, she was taking Vumerity and Gabapentin for her MS and stopped taking these medications upon finding out about the pregnancy. She denied significant viral illnesses, fevers, and bleeding during the course of her pregnancy. Her medical and surgical histories were noncontributory.  Gabapentin use does not appear to increase the risk of birth defects, miscarriage, or stillbirth; however, some studies have suggested that it may increase the risk for low birth weight and preterm birth. There is no controlled data about the effects of Vumerity in human pregnancies. However, animal studies have revealed evidence of decreased fetal body weight, an increased risk for fetal skeletal malformations, and possible adverse effects on neurobehavioral function in offspring at the highest dose tested.  Family History  A three generation pedigree was drafted and reviewed. The family history is remarkable for the following:  - Ms. Leisure has a personal history of multiple sclerosis (MS). We discussed that this is one condition in the family of conditions known as autoimmune conditions. Autoimmune conditions occur when an individual's body launches an abnormal immune  response and begins to destroy its own cells. There are several different autoimmune conditions. While we do know that autoimmune conditions tend to "cluster" within a family, they do not follow a clear pattern of inheritance. When there is a person in the family with an autoimmune condition, there is an increased chance for others in the family to develop an autoimmune condition, but it may be a different condition. Some studies have indicated that first-degree relatives of individuals with MS may have a sevenfold increased risk of developing MS themselves. Genetic testing is not available at this time to predict who may develop MS or another autoimmune condition.  - Ms. Threats has a daughter with autism. The rest of this child's history is noncontributory, and no one else in the family has learning disabilities or autism. We reviewed that autism can be isolated, multifactorial, or part of a genetic syndrome; however, underlying genetic conditions account for less than 10% of autism diagnoses. The recurrence risk for males who have older sisters with idiopathic autism is estimated to be around 17%.   The remaining family histories were reviewed and found to be noncontributory for birth defects, intellectual disability, recurrent pregnancy loss, and known genetic conditions.    The patient's ethnicity is [place]. The father of the pregnancy's ethnicity is African American. The patient was unsure of whether or not she has any Ashkenazi Jewish ancestry. She indicated that distant consanguinity (third cousins or farther out) may be a possibility. Pedigree will be scanned under Media.  Discussion  Ms. Dede was referred for genetic counseling as both her and her partner were identified as possible carriers for spinal muscular atrophy (SMA). Ms. Kretz was found to have 2 copies of the SMN1 gene on [facility] [facility]'s essential  carrier screening panel; however, she also has the c.*3+80T>G polymorphism of SMN1 in  intron 7 (also known as [id number]). This puts her at increased risk (1 in 63) to be a silent 2+0 carrier for SMA. SMA is a condition caused by mutations in the SMN1 gene. SMA is characterized by progressive muscle weakness and atrophy due to degeneration and loss of anterior horn cells (lower motor neurons) in the spinal cord and brain stem. We discussed the different types of SMA (0, I, II, and III), including differences in severity and age of onset. We also reviewed the autosomal recessive inheritance pattern of SMA.   We discussed that carrier testing for SMA was performed for her partner, Blima Ledger. This revealed that Mr. Durenda Hurt has 3 copies of the SMN1 gene. However, he also was identified to carry the  the c.*3+80T>G polymorphism of SMN1 in intron 7 as well as a pathogenic variant (mutation) in one of his copies of the SMN1 gene. This pathogenic variant has been associated with SMA. Based on the gene dosage technology utilized for SMA carrier screening, it is not possible to tell how Mr. Oda Cogan SMN1 copies are distributed between his two chromosomes. This limits our understanding of whether or not Mr. Durenda Hurt is truly a carrier for SMA. For example, if the SMN1 pathogenic variant is present in isolation on one chromosome while the two normal copies of SMN1 are present together on the other chromosome, Mr. Durenda Hurt could pass on the chromosome that contains the pathogenic SMN1 variant to his offspring. In this scenario, Mr. Durenda Hurt would be considered a carrier for SMA.   Linkage analysis could be performed by testing the couple's parents and children to assist in determining the chromosomal distribution of the couple's SMN1 copies. Without linkage analysis, the exact risk of Mr. Durenda Hurt being a carrier for SMA cannot be definitively determined. Given that Ms. Edgington has a 37 in 34 (97%) chance of not being a carrier for SMA, even if it is assumed that Mr. Durenda Hurt is a carrier, the  couple would have a less than 1% chance of having a fetus affected by SMA. However, if both Ms. Critzer and Mr. Durenda Hurt were determined to be definitive carriers of SMA, there would be a 1 in 4 (25%) chance of having an affected fetus.   Ms. Krisher carrier screening was negative for cystic fibrosis (CF), fragile X syndrome, and hemoglobinopathies. Thus, her risk to be a carrier for these additional conditions has been reduced but not eliminated. She was informed that select hemoglobinopathies and CF are included on [place]'s newborn screen, but that SMA currently is not included. We discussed the Early Check research study to add SMA to her baby's newborn screening panel. The Early Check study would facilitate early diagnosis for an infant affected by SMA. Early diagnosis allows for early initiation of treatment options, which can reduce the amount of lower motor neurons that are degenerated in an affected infant within the first several months of life. Ms. Marlowe indicated that she was interested in pursuing this, so she was given written information on how to enroll in the Early Check study.   Ms. Straughter was also counseled regarding diagnostic testing via amniocentesis. We discussed the technical aspects of the procedure and quoted up to a 1 in 500 (0.2%) risk for spontaneous pregnancy loss or other adverse pregnancy outcomes as a result of amniocentesis. Cultured cells from an amniocentesis sample allow for the visualization of a fetal karyotype, which can detect >99%  of chromosomal aberrations. Chromosomal microarray can also be performed to identify smaller deletions or duplications of fetal chromosomal material. Amniocentesis could also be performed to assess whether the baby is affected by SMA. We discussed that amniocentesis could assess for both SMN1 and SMN2 copy number in the fetus, as SMN2 is a "backup gene" for SMA, and higher SMN2 copy number is associated with less severe symptoms.  After careful consideration, Ms. Curiale declined amniocentesis at this time, preferring to sign up for the Early Check research study for SMA testing postnatally. She understands that amniocentesis is available at any point after 16 weeks of pregnancy and that she may opt to undergo the procedure at a later date should she change her mind.  We also reviewed that per records, Ms. Bowermaster had noninvasive prenatal screening (NIPS) that was low-risk for fetal aneuploidies. We reviewed that these results did not show an increased risk for trisomies 33, 72 and 70, and sex chromosome aneuploidies in the current pregnancy. We reviewed that while this testing identifies most pregnancies with trisomy 65, trisomy 70, and sex chromosome aneuploidies, it is NOT diagnostic. A positive test result requires confirmation by CVS or amniocentesis, and a negative test result does not rule out a fetal chromosome abnormality. She also understands that this testing does not identify all genetic conditions.  Lastly, the patient was made aware that screening for open neural tube defects (ONTDs) via MS-AFP in the second trimester in addition to level II ultrasound examination is recommended. Level II ultrasound and MS-AFP are able to detect ONTDs with 90-95% sensitivity. However, normal results from any of the above options do not guarantee a normal baby, as 3-5% of newborns have some type of birth defect, many of which are not prenatally diagnosable. Ms. Vallie inquired about whether or not a fetus with SMA would show signs on level II ultrasound. We discussed that there is little evidence that affected fetuses exhibit signs of the condition on ultrasound prenatally.   Additional screening and diagnostic testing were declined today. She understands that screening tests, including ultrasound, cannot rule out all birth defects or genetic syndromes. The patient was advised of this limitation and states she still does not want  additional testing or screening at this time.   I counseled Ms. Lathon regarding the above risks and available options. The approximate face-to-face time with the genetic counselor was 30 minutes.  In summary: Discussed carrier screening results and options for follow-up testing  Ms. Embry has increased risk (1 in 65) to be silent carrier of SMA  Husband may be carrier of SMA  Declined amniocentesis  Opted to register for the Early Check research study to have SMA added to her baby's newborn screening panel Reviewed low-risk NIPS result  Reduction in risk for chromosomal aneuploidies Offered additional testing and screening  MS-AFP screening recommended Reviewed family history concerns   Gershon Crane, MS Genetic Counselor

## 2019-02-04 ENCOUNTER — Ambulatory Visit: Payer: 59

## 2019-02-19 ENCOUNTER — Other Ambulatory Visit: Payer: Self-pay

## 2019-02-19 ENCOUNTER — Encounter (HOSPITAL_COMMUNITY): Payer: Self-pay | Admitting: Obstetrics and Gynecology

## 2019-02-19 ENCOUNTER — Inpatient Hospital Stay (HOSPITAL_COMMUNITY)
Admission: AD | Admit: 2019-02-19 | Discharge: 2019-02-19 | Disposition: A | Discharge status: Home / Self Care | Payer: 59 | Attending: Obstetrics and Gynecology | Admitting: Obstetrics and Gynecology

## 2019-02-19 DIAGNOSIS — O09523 Supervision of elderly multigravida, third trimester: Secondary | ICD-10-CM | POA: Insufficient documentation

## 2019-02-19 DIAGNOSIS — O119 Pre-existing hypertension with pre-eclampsia, unspecified trimester: Secondary | ICD-10-CM

## 2019-02-19 DIAGNOSIS — O113 Pre-existing hypertension with pre-eclampsia, third trimester: Secondary | ICD-10-CM | POA: Diagnosis not present

## 2019-02-19 DIAGNOSIS — O10013 Pre-existing essential hypertension complicating pregnancy, third trimester: Secondary | ICD-10-CM | POA: Diagnosis present

## 2019-02-19 DIAGNOSIS — O24419 Gestational diabetes mellitus in pregnancy, unspecified control: Secondary | ICD-10-CM | POA: Diagnosis not present

## 2019-02-19 DIAGNOSIS — Z87891 Personal history of nicotine dependence: Secondary | ICD-10-CM | POA: Diagnosis not present

## 2019-02-19 DIAGNOSIS — Z3A36 36 weeks gestation of pregnancy: Secondary | ICD-10-CM | POA: Insufficient documentation

## 2019-02-19 DIAGNOSIS — O34219 Maternal care for unspecified type scar from previous cesarean delivery: Secondary | ICD-10-CM | POA: Diagnosis not present

## 2019-02-19 DIAGNOSIS — Z3689 Encounter for other specified antenatal screening: Secondary | ICD-10-CM

## 2019-02-19 HISTORY — DX: Gestational diabetes mellitus in pregnancy, unspecified control: O24.419

## 2019-02-19 LAB — COMPREHENSIVE METABOLIC PANEL
ALT: 20 U/L (ref 0–44)
AST: 17 U/L (ref 15–41)
Albumin: 2.9 g/dL — ABNORMAL LOW (ref 3.5–5.0)
Alkaline Phosphatase: 85 U/L (ref 38–126)
Anion gap: 13 (ref 5–15)
BUN: 15 mg/dL (ref 6–20)
CO2: 18 mmol/L — ABNORMAL LOW (ref 22–32)
Calcium: 8.8 mg/dL — ABNORMAL LOW (ref 8.9–10.3)
Chloride: 104 mmol/L (ref 98–111)
Creatinine, Ser: 0.62 mg/dL (ref 0.44–1.00)
GFR calc Af Amer: >60 mL/min (ref >60)
GFR calc non Af Amer: >60 mL/min (ref >60)
Glucose, Bld: 89 mg/dL (ref 70–99)
Potassium: 3.6 mmol/L (ref 3.5–5.1)
Sodium: 135 mmol/L (ref 135–145)
Total Bilirubin: 0.5 mg/dL (ref 0.3–1.2)
Total Protein: 6.1 g/dL — ABNORMAL LOW (ref 6.5–8.1)

## 2019-02-19 LAB — URINALYSIS, ROUTINE W REFLEX MICROSCOPIC
Bilirubin Urine: NEGATIVE
Glucose, UA: NEGATIVE mg/dL
Hgb urine dipstick: NEGATIVE
Ketones, ur: NEGATIVE mg/dL
Nitrite: NEGATIVE
Protein, ur: 30 mg/dL — AB
Specific Gravity, Urine: 1.014 (ref 1.005–1.030)
pH: 6 (ref 5.0–8.0)

## 2019-02-19 LAB — CBC
HCT: 36.1 % (ref 36.0–46.0)
Hemoglobin: 12 g/dL (ref 12.0–15.0)
MCH: 30 pg (ref 26.0–34.0)
MCHC: 33.2 g/dL (ref 30.0–36.0)
MCV: 90.3 fL (ref 80.0–100.0)
Platelets: 228 10*3/uL (ref 150–400)
RBC: 4 MIL/uL (ref 3.87–5.11)
RDW: 12.9 % (ref 11.5–15.5)
WBC: 8.1 10*3/uL (ref 4.0–10.5)
nRBC: 0 % (ref 0.0–0.2)

## 2019-02-19 LAB — PROTEIN / CREATININE RATIO, URINE
Creatinine, Urine: 68.9 mg/dL
Protein Creatinine Ratio: 0.68 mg/mg{Cre} — ABNORMAL HIGH (ref 0.00–0.15)
Total Protein, Urine: 47 mg/dL

## 2019-02-19 MED ORDER — LABETALOL HCL 100 MG PO TABS: 300.0000 mg | ORAL_TABLET | Freq: Two times a day (BID) | ORAL | 0 refills | Status: DC

## 2019-02-19 NOTE — Discharge Instructions (Signed)
Preeclampsia and Eclampsia Preeclampsia is a serious condition that may develop during pregnancy. This condition causes high blood pressure and increased protein in your urine along with other symptoms, such as headaches and vision changes. These symptoms may develop as the condition gets worse. Preeclampsia may occur at 20 weeks of pregnancy or later. Diagnosing and treating preeclampsia early is very important. If not treated early, it can cause serious problems for you and your baby. One problem it can lead to is eclampsia. Eclampsia is a condition that causes muscle jerking or shaking (convulsions or seizures) and other serious problems for the mother. During pregnancy, delivering your baby may be the best treatment for preeclampsia or eclampsia. For most women, preeclampsia and eclampsia symptoms go away after giving birth. In rare cases, a woman may develop preeclampsia after giving birth (postpartum preeclampsia). This usually occurs within 48 hours after childbirth but may occur up to 6 weeks after giving birth. What are the causes? The cause of preeclampsia is not known. What increases the risk? The following risk factors make you more likely to develop preeclampsia:  Being pregnant for the first time.  Having had preeclampsia during a past pregnancy.  Having a family history of preeclampsia.  Having high blood pressure.  Being pregnant with more than one baby.  Being 35 or older.  Being African-American.  Having kidney disease or diabetes.  Having medical conditions such as lupus or blood diseases.  Being very overweight (obese). What are the signs or symptoms? The most common symptoms are:  Severe headaches.  Vision problems, such as blurred or double vision.  Abdominal pain, especially upper abdominal pain. Other symptoms that may develop as the condition gets worse include:  Sudden weight gain.  Sudden swelling of the hands, face, legs, and feet.  Severe nausea  and vomiting.  Numbness in the face, arms, legs, and feet.  Dizziness.  Urinating less than usual.  Slurred speech.  Convulsions or seizures. How is this diagnosed? There are no screening tests for preeclampsia. Your health care provider will ask you about symptoms and check for signs of preeclampsia during your prenatal visits. You may also have tests that include:  Checking your blood pressure.  Urine tests to check for protein. Your health care provider will check for this at every prenatal visit.  Blood tests.  Monitoring your baby's heart rate.  Ultrasound. How is this treated? You and your health care provider will determine the treatment approach that is best for you. Treatment may include:  Having more frequent prenatal exams to check for signs of preeclampsia, if you have an increased risk for preeclampsia.  Medicine to lower your blood pressure.  Staying in the hospital, if your condition is severe. There, treatment will focus on controlling your blood pressure and the amount of fluids in your body (fluid retention).  Taking medicine (magnesium sulfate) to prevent seizures. This may be given as an injection or through an IV.  Taking a low-dose aspirin during your pregnancy.  Delivering your baby early. You may have your labor started with medicine (induced), or you may have a cesarean delivery. Follow these instructions at home: Eating and drinking   Drink enough fluid to keep your urine pale yellow.  Avoid caffeine. Lifestyle  Do not use any products that contain nicotine or tobacco, such as cigarettes and e-cigarettes. If you need help quitting, ask your health care provider.  Do not use alcohol or drugs.  Avoid stress as much as possible. Rest and get   plenty of sleep. General instructions  Take over-the-counter and prescription medicines only as told by your health care provider.  When lying down, lie on your left side. This keeps pressure off your  major blood vessels.  When sitting or lying down, raise (elevate) your feet. Try putting some pillows underneath your lower legs.  Exercise regularly. Ask your health care provider what kinds of exercise are best for you.  Keep all follow-up and prenatal visits as told by your health care provider. This is important. How is this prevented? There is no known way of preventing preeclampsia or eclampsia from developing. However, to lower your risk of complications and detect problems early:  Get regular prenatal care. Your health care provider may be able to diagnose and treat the condition early.  Maintain a healthy weight. Ask your health care provider for help managing weight gain during pregnancy.  Work with your health care provider to manage any long-term (chronic) health conditions you have, such as diabetes or kidney problems.  You may have tests of your blood pressure and kidney function after giving birth.  Your health care provider may have you take low-dose aspirin during your next pregnancy. Contact a health care provider if:  You have symptoms that your health care provider told you may require more treatment or monitoring, such as: ? Headaches. ? Nausea or vomiting. ? Abdominal pain. ? Dizziness. ? Light-headedness. Get help right away if:  You have severe: ? Abdominal pain. ? Headaches that do not get better. ? Dizziness. ? Vision problems. ? Confusion. ? Nausea or vomiting.  You have any of the following: ? A seizure. ? Sudden, rapid weight gain. ? Sudden swelling in your hands, ankles, or face. ? Trouble moving any part of your body. ? Numbness in any part of your body. ? Trouble speaking. ? Abnormal bleeding.  You faint. Summary  Preeclampsia is a serious condition that may develop during pregnancy.  This condition causes high blood pressure and increased protein in your urine along with other symptoms, such as headaches and vision  changes.  Diagnosing and treating preeclampsia early is very important. If not treated early, it can cause serious problems for you and your baby.  Get help right away if you have symptoms that your health care provider told you to watch for. This information is not intended to replace advice given to you by your health care provider. Make sure you discuss any questions you have with your health care provider. Document Revised: 09/03/2017 Document Reviewed: 08/08/2015 Elsevier Patient Education  2020 [employer]  

## 2019-02-19 NOTE — MAU Note (Signed)
Pt states she had routine OB visit & noted to have BP 135/100, 134/102; sent from office to MAU.  Denies symptoms of PreE.  H/o cHTN, taking Labetalol 100mg  BID, last taken today at 0800. Denies LOF, VB; endorses +FM. gDM insulin dependent, taking Metformin.

## 2019-02-19 NOTE — MAU Provider Note (Signed)
History     CSN: [id number]  Arrival date and time: 02/19/19 1327  39 y.o. [id number] @36 .5 wks sent from office for worsening BP. Hx of CHTN on Labetalol which she took today. Denies HA, visual disturbances, RUQ pain, SOB, and CP. Reports +FM. No VB, LOF, or ctx. Her pregnancy is complicated by AMA, CHTN, A2GDM, previous CS x2, hyperthyroidism, anxiety, and MS.     OB History    Gravida  4   Para  2   Term  2   Preterm      AB  1   Living  2     SAB      TAB  1   Ectopic      Multiple      Live Births  2           Past Medical History:  Diagnosis Date  . Anxiety   . Chicken pox   . Gestational diabetes    takes insulin & metformin  . Hypertension    take labetalol  . Multiple sclerosis (HCC) 12/2017    Past Surgical History:  Procedure Laterality Date  . CESAREAN SECTION     x2    Family History  Problem Relation Age of Onset  . Hypertension Mother   . Heart disease Father   . Hyperlipidemia Father   . Hypertension Father   . Breast cancer Paternal Grandmother   . Cancer Neg Hx     Social History   Tobacco Use  . Smoking status: Former Smoker    Types: Cigars  . Smokeless tobacco: Never Used  Substance Use Topics  . Alcohol use: Yes    Comment: occasionally wine  . Drug use: Not Currently    Allergies: No Known Allergies  No medications prior to admission.    Review of Systems  Eyes: Negative for visual disturbance.  Respiratory: Negative for shortness of breath.   Gastrointestinal: Negative for abdominal pain.  Genitourinary: Negative for vaginal bleeding and vaginal discharge.  Neurological: Negative for headaches.   Physical Exam   Blood pressure 138/90, pulse 97, temperature 98.4 F (36.9 C), temperature source Oral, resp. rate 16, SpO2 99 %. Patient Vitals for the past 24 hrs:  BP Temp Temp src Pulse Resp SpO2  02/19/19 1531 138/90 -- -- 97 -- --  02/19/19 1516 (!) 130/96 -- -- 95 -- --  02/19/19 1501 133/90 -- -- 98  -- --  02/19/19 1446 (!) 135/94 -- -- 98 -- --  02/19/19 1431 (!) 133/95 -- -- 100 -- --  02/19/19 1416 (!) 147/99 -- -- 97 -- --  02/19/19 1407 (!) 150/103 98.4 F (36.9 C) Oral 100 16 99 %   Physical Exam  Nursing note and vitals reviewed. Constitutional: She is oriented to person, place, and time. She appears well-developed and well-nourished. No distress.  HENT:  Head: Normocephalic and atraumatic.  Cardiovascular: Normal rate.  Respiratory: Effort normal. No respiratory distress.  Musculoskeletal:        General: No edema. Normal range of motion.     Cervical back: Normal range of motion.  Neurological: She is alert and oriented to person, place, and time.  Psychiatric: She has a normal mood and affect.  EFM: 140 bpm, mod variability, + accels, no decels Toco: irregular  Results for orders placed or performed during the hospital encounter of 02/19/19 (from the past 24 hour(s))  Urinalysis, Routine w reflex microscopic     Status: Abnormal   Collection Time: 02/19/19  2:10 PM  Result Value Ref Range   Color, Urine YELLOW YELLOW   APPearance HAZY (A) CLEAR   Specific Gravity, Urine 1.014 1.005 - 1.030   pH 6.0 5.0 - 8.0   Glucose, UA NEGATIVE NEGATIVE mg/dL   Hgb urine dipstick NEGATIVE NEGATIVE   Bilirubin Urine NEGATIVE NEGATIVE   Ketones, ur NEGATIVE NEGATIVE mg/dL   Protein, ur 30 (A) NEGATIVE mg/dL   Nitrite NEGATIVE NEGATIVE   Leukocytes,Ua TRACE (A) NEGATIVE   RBC / HPF 0-5 0 - 5 RBC/hpf   WBC, UA 0-5 0 - 5 WBC/hpf   Bacteria, UA RARE (A) NONE SEEN   Squamous Epithelial / LPF 0-5 0 - 5   Mucus PRESENT   Protein / creatinine ratio, urine     Status: Abnormal   Collection Time: 02/19/19  2:15 PM  Result Value Ref Range   Creatinine, Urine 68.90 mg/dL   Total Protein, Urine 47 mg/dL   Protein Creatinine Ratio 0.68 (H) 0.00 - 0.15 mg/mg[Cre]  CBC     Status: None   Collection Time: 02/19/19  2:28 PM  Result Value Ref Range   WBC 8.1 4.0 - 10.5 K/uL   RBC 4.00  3.87 - 5.11 MIL/uL   Hemoglobin 12.0 12.0 - 15.0 g/dL   HCT 36.1 44.3 - 15.4 %   MCV 90.3 80.0 - 100.0 fL   MCH 30.0 26.0 - 34.0 pg   MCHC 33.2 30.0 - 36.0 g/dL   RDW 00.8 67.6 - 19.5 %   Platelets 228 150 - 400 K/uL   nRBC 0.0 0.0 - 0.2 %  Comprehensive metabolic panel     Status: Abnormal   Collection Time: 02/19/19  2:28 PM  Result Value Ref Range   Sodium 135 135 - 145 mmol/L   Potassium 3.6 3.5 - 5.1 mmol/L   Chloride 104 98 - 111 mmol/L   CO2 18 (L) 22 - 32 mmol/L   Glucose, Bld 89 70 - 99 mg/dL   BUN 15 6 - 20 mg/dL   Creatinine, Ser 0.93 0.44 - 1.00 mg/dL   Calcium 8.8 (L) 8.9 - 10.3 mg/dL   Total Protein 6.1 (L) 6.5 - 8.1 g/dL   Albumin 2.9 (L) 3.5 - 5.0 g/dL   AST 17 15 - 41 U/L   ALT 20 0 - 44 U/L   Alkaline Phosphatase 85 38 - 126 U/L   Total Bilirubin 0.5 0.3 - 1.2 mg/dL   GFR calc non Af Amer >60 >60 mL/min   GFR calc Af Amer >60 >60 mL/min   Anion gap 13 5 - 15   MAU Course  Procedures  MDM Chart reviewed: pregnancy complicated by AMA, CHTN, A2GDM, previous CS x2, hyperthyroidism, anxiety, and MS. Labs ordered and reviewed. UPC slightly more elevated from baseline, no severe features. Plan for Labetalol increase and moved her RCS to 02/23/19 per Dr. Reina Fuse. Pt informed of plan and agrees. Stable for discharge home.    Assessment and Plan   1. [redacted] weeks gestation of pregnancy   2. NST (non-stress test) reactive   3. Chronic hypertension with superimposed pre-eclampsia    Discharge home Follow [facility]/23/19 for surgery Strict return precautions  Allergies as of 02/19/2019   No Known Allergies     Medication List    STOP taking these medications   FOLIC ACID PO   potassium chloride 10 MEQ tablet Commonly known as: Klor-Con 10     TAKE these medications   labetalol 100  MG tablet Commonly known as: NORMODYNE Take 3 tablets (300 mg total) by mouth 2 (two) times daily. What changed: how much to take   PRE-NATAL PO Take by mouth.       Julianne Handler, CNM 02/19/2019, 4:15 PM

## 2019-02-23 ENCOUNTER — Encounter (HOSPITAL_COMMUNITY)
Admission: RE | Disposition: A | Discharge status: Home / Self Care | Payer: Self-pay | Source: Home / Self Care | Attending: Obstetrics and Gynecology

## 2019-02-23 ENCOUNTER — Other Ambulatory Visit: Payer: Self-pay

## 2019-02-23 ENCOUNTER — Inpatient Hospital Stay (HOSPITAL_COMMUNITY)
Admission: RE | Admit: 2019-02-23 | Discharge: 2019-02-25 | DRG: 784 | Disposition: A | Discharge status: Home / Self Care | Payer: 59 | Attending: Obstetrics and Gynecology | Admitting: Obstetrics and Gynecology

## 2019-02-23 ENCOUNTER — Telehealth (HOSPITAL_COMMUNITY): Payer: Self-pay | Admitting: *Deleted

## 2019-02-23 ENCOUNTER — Inpatient Hospital Stay (HOSPITAL_COMMUNITY): Payer: 59 | Admitting: Anesthesiology

## 2019-02-23 ENCOUNTER — Encounter (HOSPITAL_COMMUNITY): Payer: Self-pay | Admitting: Obstetrics and Gynecology

## 2019-02-23 ENCOUNTER — Other Ambulatory Visit (HOSPITAL_COMMUNITY): Admission: RE | Admit: 2019-02-23 | Payer: 59 | Source: Ambulatory Visit

## 2019-02-23 DIAGNOSIS — Z87891 Personal history of nicotine dependence: Secondary | ICD-10-CM

## 2019-02-23 DIAGNOSIS — Z20822 Contact with and (suspected) exposure to covid-19: Secondary | ICD-10-CM | POA: Diagnosis present

## 2019-02-23 DIAGNOSIS — O34211 Maternal care for low transverse scar from previous cesarean delivery: Principal | ICD-10-CM | POA: Diagnosis present

## 2019-02-23 DIAGNOSIS — Z3A37 37 weeks gestation of pregnancy: Secondary | ICD-10-CM

## 2019-02-23 DIAGNOSIS — G35 Multiple sclerosis: Secondary | ICD-10-CM | POA: Diagnosis present

## 2019-02-23 DIAGNOSIS — O99214 Obesity complicating childbirth: Secondary | ICD-10-CM | POA: Diagnosis present

## 2019-02-23 DIAGNOSIS — Z302 Encounter for sterilization: Secondary | ICD-10-CM | POA: Diagnosis not present

## 2019-02-23 DIAGNOSIS — Z9851 Tubal ligation status: Secondary | ICD-10-CM

## 2019-02-23 DIAGNOSIS — O99354 Diseases of the nervous system complicating childbirth: Secondary | ICD-10-CM | POA: Diagnosis present

## 2019-02-23 DIAGNOSIS — Z98891 History of uterine scar from previous surgery: Secondary | ICD-10-CM

## 2019-02-23 DIAGNOSIS — O24425 Gestational diabetes mellitus in childbirth, controlled by oral hypoglycemic drugs: Secondary | ICD-10-CM | POA: Diagnosis present

## 2019-02-23 DIAGNOSIS — O134 Gestational [pregnancy-induced] hypertension without significant proteinuria, complicating childbirth: Secondary | ICD-10-CM | POA: Diagnosis present

## 2019-02-23 DIAGNOSIS — O133 Gestational [pregnancy-induced] hypertension without significant proteinuria, third trimester: Secondary | ICD-10-CM | POA: Diagnosis present

## 2019-02-23 HISTORY — DX: Tubal ligation status: Z98.51

## 2019-02-23 LAB — GLUCOSE, CAPILLARY
Glucose-Capillary: 104 mg/dL — ABNORMAL HIGH (ref 70–99)
Glucose-Capillary: 81 mg/dL (ref 70–99)
Glucose-Capillary: 72 mg/dL (ref 70–99)

## 2019-02-23 LAB — TYPE AND SCREEN
ABO/RH(D): O POS
Antibody Screen: NEGATIVE

## 2019-02-23 LAB — HEPATIC FUNCTION PANEL
ALT: 24 U/L (ref 0–44)
AST: 26 U/L (ref 15–41)
Albumin: 3.2 g/dL — ABNORMAL LOW (ref 3.5–5.0)
Alkaline Phosphatase: 98 U/L (ref 38–126)
Bilirubin, Direct: <0.1 mg/dL (ref 0.0–0.2)
Total Bilirubin: 0.6 mg/dL (ref 0.3–1.2)
Total Protein: 6.9 g/dL (ref 6.5–8.1)

## 2019-02-23 LAB — ABO/RH: ABO/RH(D): O POS

## 2019-02-23 LAB — CBC
HCT: 38.2 % (ref 36.0–46.0)
Hemoglobin: 13 g/dL (ref 12.0–15.0)
MCH: 30.7 pg (ref 26.0–34.0)
MCHC: 34 g/dL (ref 30.0–36.0)
MCV: 90.3 fL (ref 80.0–100.0)
Platelets: 262 10*3/uL (ref 150–400)
RBC: 4.23 MIL/uL (ref 3.87–5.11)
RDW: 13 % (ref 11.5–15.5)
WBC: 7.8 10*3/uL (ref 4.0–10.5)
nRBC: 0 % (ref 0.0–0.2)

## 2019-02-23 LAB — RESPIRATORY PANEL BY RT PCR (FLU A&B, COVID)
Influenza A by PCR: NEGATIVE
Influenza B by PCR: NEGATIVE
SARS Coronavirus 2 by RT PCR: NEGATIVE

## 2019-02-23 SURGERY — Surgical Case
Anesthesia: Spinal | Laterality: Bilateral | Wound class: Clean Contaminated

## 2019-02-23 MED ORDER — ONDANSETRON HCL 4 MG/2ML IJ SOLN: 4.0000 mg | Freq: Four times a day (QID) | INTRAMUSCULAR | Status: DC | PRN

## 2019-02-23 MED ORDER — OXYCODONE HCL 5 MG PO TABS: 5.0000 mg | ORAL_TABLET | ORAL | Status: DC | PRN

## 2019-02-23 MED ORDER — MAGNESIUM HYDROXIDE 400 MG/5ML PO SUSP: 30.0000 mL | Freq: Every day | ORAL | Status: DC | PRN

## 2019-02-23 MED ORDER — LABETALOL HCL 100 MG PO TABS
100.0000 mg | ORAL_TABLET | Freq: Two times a day (BID) | ORAL | Status: DC
  Administered 2019-02-23 – 2019-02-24 (×2): 100 mg via ORAL
  Filled 2019-02-23 (×2): qty 1

## 2019-02-23 MED ORDER — IBUPROFEN 800 MG PO TABS
800.0000 mg | ORAL_TABLET | Freq: Three times a day (TID) | ORAL | Status: DC
  Administered 2019-02-24 – 2019-02-25 (×5): 800 mg via ORAL
  Filled 2019-02-23 (×5): qty 1

## 2019-02-23 MED ORDER — KETOROLAC TROMETHAMINE 30 MG/ML IJ SOLN
INTRAMUSCULAR | Status: AC
  Filled 2019-02-23: qty 1

## 2019-02-23 MED ORDER — DIPHENHYDRAMINE HCL 50 MG/ML IJ SOLN: 12.5000 mg | INTRAMUSCULAR | Status: DC | PRN

## 2019-02-23 MED ORDER — LABETALOL HCL 200 MG PO TABS: 300.0000 mg | ORAL_TABLET | Freq: Two times a day (BID) | ORAL | Status: DC

## 2019-02-23 MED ORDER — OXYCODONE HCL 5 MG/5ML PO SOLN: 5.0000 mg | Freq: Once | ORAL | Status: DC | PRN

## 2019-02-23 MED ORDER — OXYTOCIN 40 UNITS IN NORMAL SALINE INFUSION - SIMPLE MED
INTRAVENOUS | Status: DC | PRN
  Administered 2019-02-23: 40 [IU] via INTRAVENOUS

## 2019-02-23 MED ORDER — LACTATED RINGERS IV SOLN: INTRAVENOUS | Status: DC

## 2019-02-23 MED ORDER — FENTANYL CITRATE (PF) 100 MCG/2ML IJ SOLN
INTRAMUSCULAR | Status: AC
  Filled 2019-02-23: qty 2

## 2019-02-23 MED ORDER — ONDANSETRON HCL 4 MG PO TABS: 4.0000 mg | ORAL_TABLET | Freq: Four times a day (QID) | ORAL | Status: DC | PRN

## 2019-02-23 MED ORDER — FENTANYL CITRATE (PF) 100 MCG/2ML IJ SOLN: 25.0000 ug | INTRAMUSCULAR | Status: DC | PRN

## 2019-02-23 MED ORDER — BUPIVACAINE IN DEXTROSE 0.75-8.25 % IT SOLN
INTRATHECAL | Status: DC | PRN
  Administered 2019-02-23: 1.6 mL via INTRATHECAL

## 2019-02-23 MED ORDER — MORPHINE SULFATE (PF) 0.5 MG/ML IJ SOLN
INTRAMUSCULAR | Status: DC | PRN
  Administered 2019-02-23: .15 mg via INTRATHECAL

## 2019-02-23 MED ORDER — CEFAZOLIN SODIUM-DEXTROSE 2-4 GM/100ML-% IV SOLN
2.0000 g | INTRAVENOUS | Status: AC
  Administered 2019-02-23: 2 g via INTRAVENOUS

## 2019-02-23 MED ORDER — NALOXONE HCL 4 MG/10ML IJ SOLN
1.0000 ug/kg/h | INTRAVENOUS | Status: DC | PRN
  Filled 2019-02-23: qty 5

## 2019-02-23 MED ORDER — MORPHINE SULFATE (PF) 0.5 MG/ML IJ SOLN
INTRAMUSCULAR | Status: AC
  Filled 2019-02-23: qty 10

## 2019-02-23 MED ORDER — PHENYLEPHRINE HCL-NACL 20-0.9 MG/250ML-% IV SOLN
INTRAVENOUS | Status: DC | PRN
  Administered 2019-02-23: 60 ug/min via INTRAVENOUS

## 2019-02-23 MED ORDER — KETOROLAC TROMETHAMINE 30 MG/ML IJ SOLN: 30.0000 mg | Freq: Four times a day (QID) | INTRAMUSCULAR | Status: AC | PRN

## 2019-02-23 MED ORDER — OXYTOCIN 40 UNITS IN NORMAL SALINE INFUSION - SIMPLE MED
INTRAVENOUS | Status: AC
  Filled 2019-02-23: qty 1000

## 2019-02-23 MED ORDER — SCOPOLAMINE 1 MG/3DAYS TD PT72
MEDICATED_PATCH | TRANSDERMAL | Status: AC
  Filled 2019-02-23: qty 1

## 2019-02-23 MED ORDER — LACTATED RINGERS IV SOLN: INTRAVENOUS | Status: DC | PRN

## 2019-02-23 MED ORDER — PHENYLEPHRINE HCL-NACL 20-0.9 MG/250ML-% IV SOLN
INTRAVENOUS | Status: AC
  Filled 2019-02-23: qty 250

## 2019-02-23 MED ORDER — ACETAMINOPHEN 500 MG PO TABS
ORAL_TABLET | ORAL | Status: AC
  Filled 2019-02-23: qty 2

## 2019-02-23 MED ORDER — MEPERIDINE HCL 25 MG/ML IJ SOLN: 6.2500 mg | INTRAMUSCULAR | Status: DC | PRN

## 2019-02-23 MED ORDER — DIPHENHYDRAMINE HCL 25 MG PO CAPS: 25.0000 mg | ORAL_CAPSULE | ORAL | Status: DC | PRN

## 2019-02-23 MED ORDER — SCOPOLAMINE 1 MG/3DAYS TD PT72
1.0000 | MEDICATED_PATCH | Freq: Once | TRANSDERMAL | Status: DC
  Administered 2019-02-23: 1.5 mg via TRANSDERMAL

## 2019-02-23 MED ORDER — NALBUPHINE HCL 10 MG/ML IJ SOLN
INTRAMUSCULAR | Status: AC
  Filled 2019-02-23: qty 1

## 2019-02-23 MED ORDER — NALBUPHINE HCL 10 MG/ML IJ SOLN: 5.0000 mg | Freq: Once | INTRAMUSCULAR | Status: AC | PRN

## 2019-02-23 MED ORDER — NALBUPHINE HCL 10 MG/ML IJ SOLN
5.0000 mg | INTRAMUSCULAR | Status: DC | PRN
  Administered 2019-02-23 – 2019-02-24 (×2): 5 mg via INTRAVENOUS
  Filled 2019-02-23 (×2): qty 1

## 2019-02-23 MED ORDER — SODIUM CHLORIDE 0.9% FLUSH: 3.0000 mL | INTRAVENOUS | Status: DC | PRN

## 2019-02-23 MED ORDER — ONDANSETRON HCL 4 MG/2ML IJ SOLN
INTRAMUSCULAR | Status: DC | PRN
  Administered 2019-02-23: 4 mg via INTRAVENOUS

## 2019-02-23 MED ORDER — SODIUM CHLORIDE 0.9 % IR SOLN
Status: DC | PRN
  Administered 2019-02-23: 1000 mL

## 2019-02-23 MED ORDER — NALBUPHINE HCL 10 MG/ML IJ SOLN
5.0000 mg | Freq: Once | INTRAMUSCULAR | Status: AC | PRN
  Administered 2019-02-23: 5 mg via SUBCUTANEOUS

## 2019-02-23 MED ORDER — OXYCODONE HCL 5 MG PO TABS: 5.0000 mg | ORAL_TABLET | Freq: Once | ORAL | Status: DC | PRN

## 2019-02-23 MED ORDER — CEFAZOLIN SODIUM-DEXTROSE 2-4 GM/100ML-% IV SOLN
INTRAVENOUS | Status: AC
  Filled 2019-02-23: qty 100

## 2019-02-23 MED ORDER — KETOROLAC TROMETHAMINE 30 MG/ML IJ SOLN
30.0000 mg | Freq: Four times a day (QID) | INTRAMUSCULAR | Status: AC | PRN
  Administered 2019-02-23 (×2): 30 mg via INTRAVENOUS
  Filled 2019-02-23: qty 1

## 2019-02-23 MED ORDER — SIMETHICONE 80 MG PO CHEW: 80.0000 mg | CHEWABLE_TABLET | Freq: Four times a day (QID) | ORAL | Status: DC | PRN

## 2019-02-23 MED ORDER — SODIUM CHLORIDE 0.9 % IV SOLN: INTRAVENOUS | Status: DC | PRN

## 2019-02-23 MED ORDER — TRANEXAMIC ACID-NACL 1000-0.7 MG/100ML-% IV SOLN
INTRAVENOUS | Status: AC
  Filled 2019-02-23: qty 100

## 2019-02-23 MED ORDER — NALBUPHINE HCL 10 MG/ML IJ SOLN: 5.0000 mg | INTRAMUSCULAR | Status: DC | PRN

## 2019-02-23 MED ORDER — TRANEXAMIC ACID 1000 MG/10ML IV SOLN
INTRAVENOUS | Status: DC | PRN
  Administered 2019-02-23: 16:00:00 1000 mg via INTRAVENOUS

## 2019-02-23 MED ORDER — ONDANSETRON HCL 4 MG/2ML IJ SOLN
INTRAMUSCULAR | Status: AC
  Filled 2019-02-23: qty 2

## 2019-02-23 MED ORDER — ONDANSETRON HCL 4 MG/2ML IJ SOLN: 4.0000 mg | Freq: Three times a day (TID) | INTRAMUSCULAR | Status: DC | PRN

## 2019-02-23 MED ORDER — FENTANYL CITRATE (PF) 100 MCG/2ML IJ SOLN
INTRAMUSCULAR | Status: DC | PRN
  Administered 2019-02-23: 15 ug via INTRATHECAL

## 2019-02-23 MED ORDER — INSULIN GLARGINE 100 UNIT/ML ~~LOC~~ SOLN
35.0000 [IU] | Freq: Every day | SUBCUTANEOUS | Status: DC
  Filled 2019-02-23 (×2): qty 0.35

## 2019-02-23 MED ORDER — NALOXONE HCL 0.4 MG/ML IJ SOLN: 0.4000 mg | INTRAMUSCULAR | Status: DC | PRN

## 2019-02-23 MED ORDER — ACETAMINOPHEN 500 MG PO TABS
1000.0000 mg | ORAL_TABLET | ORAL | Status: AC
  Administered 2019-02-23: 13:00:00 1000 mg via ORAL

## 2019-02-23 SURGICAL SUPPLY — 40 items
APL SKNCLS STERI-STRIP NONHPOA (GAUZE/BANDAGES/DRESSINGS) ×2
BENZOIN TINCTURE PRP APPL 2/3 (GAUZE/BANDAGES/DRESSINGS) ×2 IMPLANT
CHLORAPREP W/TINT 26ML (MISCELLANEOUS) ×3 IMPLANT
CLAMP CORD UMBIL (MISCELLANEOUS) IMPLANT
CLOTH BEACON ORANGE TIMEOUT ST (SAFETY) ×3 IMPLANT
DRAPE C SECTION CLR SCREEN (DRAPES) ×3 IMPLANT
DRSG OPSITE POSTOP 4X10 (GAUZE/BANDAGES/DRESSINGS) ×3 IMPLANT
ELECT REM PT RETURN 9FT ADLT (ELECTROSURGICAL) ×3
ELECTRODE REM PT RTRN 9FT ADLT (ELECTROSURGICAL) ×2 IMPLANT
EXTRACTOR VACUUM KIWI (MISCELLANEOUS) IMPLANT
GAUZE SPONGE 4X4 12PLY STRL LF (GAUZE/BANDAGES/DRESSINGS) ×4 IMPLANT
GLOVE BIO SURGEON STRL SZ 6.5 (GLOVE) ×3 IMPLANT
GLOVE BIOGEL PI IND STRL 7.0 (GLOVE) ×4 IMPLANT
GLOVE BIOGEL PI INDICATOR 7.0 (GLOVE) ×2
GOWN STRL REUS W/TWL LRG LVL3 (GOWN DISPOSABLE) ×6 IMPLANT
KIT ABG SYR 3ML LUER SLIP (SYRINGE) IMPLANT
NDL HYPO 25X5/8 SAFETYGLIDE (NEEDLE) IMPLANT
NEEDLE HYPO 25X5/8 SAFETYGLIDE (NEEDLE) IMPLANT
NS IRRIG 1000ML POUR BTL (IV SOLUTION) ×3 IMPLANT
PACK C SECTION WH (CUSTOM PROCEDURE TRAY) ×3 IMPLANT
PAD ABD 7.5X8 STRL (GAUZE/BANDAGES/DRESSINGS) ×2 IMPLANT
PAD OB MATERNITY 4.3X12.25 (PERSONAL CARE ITEMS) ×3 IMPLANT
RETAINER VISCERAL (MISCELLANEOUS) ×2 IMPLANT
RETRACTOR WND ALEXIS 25 LRG (MISCELLANEOUS) ×2 IMPLANT
RTRCTR C-SECT PINK 25CM LRG (MISCELLANEOUS) IMPLANT
RTRCTR WOUND ALEXIS 25CM LRG (MISCELLANEOUS) ×3
STRIP CLOSURE SKIN 1/2X4 (GAUZE/BANDAGES/DRESSINGS) ×2 IMPLANT
SUT CHROMIC 1 CTX 36 (SUTURE) ×6 IMPLANT
SUT PLAIN 0 NONE (SUTURE) IMPLANT
SUT PLAIN 2 0 XLH (SUTURE) ×3 IMPLANT
SUT VIC AB 0 CT1 27 (SUTURE) ×6
SUT VIC AB 0 CT1 27XBRD ANBCTR (SUTURE) ×4 IMPLANT
SUT VIC AB 2-0 CT1 27 (SUTURE) ×3
SUT VIC AB 2-0 CT1 TAPERPNT 27 (SUTURE) ×2 IMPLANT
SUT VIC AB 3-0 CT1 27 (SUTURE)
SUT VIC AB 3-0 CT1 TAPERPNT 27 (SUTURE) IMPLANT
SUT VIC AB 4-0 KS 27 (SUTURE) ×3 IMPLANT
TOWEL OR 17X24 6PK STRL BLUE (TOWEL DISPOSABLE) ×3 IMPLANT
TRAY FOLEY W/BAG SLVR 14FR LF (SET/KITS/TRAYS/PACK) ×3 IMPLANT
WATER STERILE IRR 1000ML POUR (IV SOLUTION) ×3 IMPLANT

## 2019-02-23 NOTE — Lactation Note (Addendum)
This note was copied from a baby's chart. Lactation Consultation Note Baby 8 hrs old. Mom has GDM. Baby hasn't BF since birth. To sleepy to latch. Mom's 3rd child. Has 62 and 39 yr old that she didn't BF. Mom would like to try to BF this baby since he's going to be her last. Mom has Large pendulous breast. Rt. Breast larger than the Lt. Mom has Large everted nipples. Rt. Breast very heavy. LC able to hand express after massage and hand expressing to collect 3 ml colostrum. LC attempted to latch baby in football hold. Baby had no interest in feeding. Mom has been holding baby STS. Skin cool to touch. LC spoon fed baby 3 ml w/much stimulation. Baby spitting colostrum out at first then took well. Suck training attempted w/gloved finger. Baby biting and clamping. Baby would occasionally suck. Cheeks massaged for stimulation to swallow. Swaddled in 2nd blanket after spoon feeding.  Newborn behavior, STS, I&O, breast massage, milk storage, feeding habits, feeding positions, support, safety while feeding, s/x of hypoglycemia, supply and demand discussed. RN had set up DEBP. Mom encouraged to pump Q3 hr for stimulation. Encouraged to call for assistance or questions. Lactation brochure given.  Patient Name: Victoria Ryan ACZYS'A Date: 02/23/2019 Reason for consult: Initial assessment;1st time breastfeeding;Maternal endocrine disorder;Early term 37-38.6wks Type of Endocrine Disorder?: Diabetes   Maternal Data Has patient been taught Hand Expression?: Yes Does the patient have breastfeeding experience prior to this delivery?: No  Feeding Feeding Type: Breast Milk  LATCH Score Latch: Too sleepy or reluctant, no latch achieved, no sucking elicited.  Audible Swallowing: None  Type of Nipple: Everted at rest and after stimulation  Comfort (Breast/Nipple): Soft / non-tender  Hold (Positioning): Full assist, staff holds infant at breast  LATCH Score: 4  Interventions Interventions:  Breast feeding basics reviewed;Support pillows;Assisted with latch;Position options;Skin to skin;Expressed milk;Breast massage;Hand express;Breast compression;Adjust position;DEBP  Lactation Tools Discussed/Used WIC Program: Yes Pump Review: Setup, frequency, and cleaning;Milk Storage Initiated by:: RN Date initiated:: 02/23/19   Consult Status Consult Status: Follow-up Date: 02/24/19 Follow-up type: In-patient    Charyl Dancer 02/23/2019, 11:37 PM

## 2019-02-23 NOTE — Interval H&P Note (Signed)
History and Physical Interval Note: No change from H/P Pt confirms desire for sterilization Consent signed To OR when ready  02/23/2019 2:25 PM  Victoria Ryan  has presented today for surgery, with the diagnosis of repeat C-section.  The various methods of treatment have been discussed with the patient and family. After consideration of risks, benefits and other options for treatment, the patient has consented to  Procedure(s) with comments: CESAREAN SECTION (N/A) - Heather,  RNFA as a surgical intervention.  The patient's history has been reviewed, patient examined, no change in status, stable for surgery.  I have reviewed the patient's chart and labs.  Questions were answered to the patient's satisfaction.     [name]

## 2019-02-23 NOTE — Anesthesia Procedure Notes (Signed)
Spinal  Patient location during procedure: OR Start time: 02/23/2019 2:56 PM End time: 02/23/2019 2:58 PM Staffing Performed: anesthesiologist  Anesthesiologist: Achille Rich, MD Preanesthetic Checklist Completed: patient identified, IV checked, risks and benefits discussed, surgical consent, monitors and equipment checked, pre-op evaluation and timeout performed Spinal Block Patient position: sitting Prep: DuraPrep Patient monitoring: cardiac monitor, continuous pulse ox and blood pressure Approach: midline Location: L3-4 Injection technique: single-shot Needle Needle type: Pencan  Needle gauge: 24 G Needle length: 9 cm Assessment Sensory level: T10 Additional Notes Functioning IV was confirmed and monitors were applied. Sterile prep and drape, including hand hygiene and sterile gloves were used. The patient was positioned and the spine was prepped. The skin was anesthetized with lidocaine.  Free flow of clear CSF was obtained prior to injecting local anesthetic into the CSF.  The spinal needle aspirated freely following injection.  The needle was carefully withdrawn.  The patient tolerated the procedure well.

## 2019-02-23 NOTE — Telephone Encounter (Signed)
Pt in route to hospital for her CS.  Med hx reviewed.  Anesthesia notified of her medications.

## 2019-02-23 NOTE — Anesthesia Preprocedure Evaluation (Signed)
Anesthesia Evaluation  Patient identified by MRN, date of birth, ID band Patient awake    Reviewed: Allergy & Precautions, H&P , NPO status , Patient's Chart, lab work & pertinent test results  Airway Mallampati: II   Neck ROM: full    Dental   Pulmonary former smoker,    breath sounds clear to auscultation       Cardiovascular hypertension,  Rhythm:regular Rate:Normal     Neuro/Psych PSYCHIATRIC DISORDERS Anxiety Depression    GI/Hepatic   Endo/Other  diabetesMorbid obesity  Renal/GU      Musculoskeletal   Abdominal   Peds  Hematology   Anesthesia Other Findings   Reproductive/Obstetrics (+) Pregnancy                             Anesthesia Physical Anesthesia Plan  ASA: II  Anesthesia Plan: Spinal   Post-op Pain Management:    Induction: Intravenous  PONV Risk Score and Plan: 2 and Treatment may vary due to age or medical condition  Airway Management Planned: Simple Face Mask  Additional Equipment:   Intra-op Plan:   Post-operative Plan:   Informed Consent: I have reviewed the patients History and Physical, chart, labs and discussed the procedure including the risks, benefits and alternatives for the proposed anesthesia with the patient or authorized representative who has indicated his/her understanding and acceptance.       Plan Discussed with: CRNA, Anesthesiologist and Petro  Anesthesia Plan Comments:         Anesthesia Quick Evaluation

## 2019-02-23 NOTE — Op Note (Signed)
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 She was prepped and draped in the normal sterile fashion in the dorsal supine position with a leftward tilt. An appropriate time out was performed. An allis clamp test was performed and anesthesia was found to be adequate.  A Pfannenstiel skin incision was then made through the previous incision with the scalpel and carried through to the underlying layer of fascia by sharp dissection. The fascia was nicked in the midline and the incision was extended laterally with Mayo scissors. The superior aspect of the incision was grasped with kocher clamps and dissected off the underlying rectus muscles. In a similar fashion the inferior aspect was dissected off the rectus muscles. Rectus muscles were separated in the midline and the peritoneal cavity entered bluntly. The peritoneal incision was then extended both superiorly and inferiorly with careful attention to avoid both bowel and bladder. The Alexis self-retaining wound retractor was then placed within the incision and the lower uterine segment exposed. The bladder flap was developed with Metzenbaum scissors and pushed away from the lower uterine segment. The lower uterine segment was then  incised in a transverse fashion and the cavity itself entered bluntly. Anterior placenta was encountered. The infant's head was then lifted and delivered from the incision without difficulty. The remainder of the infant delivered and the nose and mouth bulb suctioned with the cord clamped and cut as well. The infant was handed off to the waiting pediatricians. The placenta was then manually expressed from the uterus due to mild adhesions and the uterus cleared of all clots and debris with moist lap sponge. The uterine incision was then repaired in  a  running locked layer using 0 chromic suture; a second imbricating layer was performed using the same suture.. The left and then right tubes were then grasped with babcocks at the mid isthsmus portion and a 2-3cm intervening portion of tube excised. Each end was suture ligated. Excellent hemostasis was appreciated. The  ovaries were inspected and the gutters cleared of all clots and debris. The uterine incision was inspected and found to be hemostatic. All  instruments and sponges as well as the Alexis retractor were then removed from the abdomen. The rectus muscles and peritoneum were then reapproximated with 2-0 Vicryl. The fascia was then closed with 0 Vicryl in a running fashion. Subcutaneous tissue was reapproximated after scarring released for better cosmesis. The skin was closed with a subcuticular stitch of 4-0 Vicryl on a Keith needle and then reinforced with benzoin and Steri-Strips. At the conclusion of the procedure all instruments and sponge counts were correct. Patient was taken to the recovery room in good condition with her baby accompanying her skin to skin.

## 2019-02-23 NOTE — H&P (Signed)
Victoria Ryan is a 39 y.D.X8P3825 female presenting at 101 2/7wks for scheduled repeat cesarean section and bilateral tubal ligation. Pt is dated per an 11week [place]. She had a history of anxiety, hyperthyroidism, multiple sclerosis, pror hypertensive disease in pregnancy and a previous cesarean section prior to pregnancy. She is AMA. She developed gestational hypertension with proteinuria at [redacted] weeks gestation; labs otherwise normal since; has been on labetalol 200mg  po bid with moderate control of BP. She also developed gestational diabetes and required treatment with metformin 1000mg  po bid and insulin 65units in pm due to persistently elevated fasting blood sugar. 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  Her panorama screen showed low risk. 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 Anxiety   . Chicken pox   . Gestational diabetes    takes insulin & metformin  . Hypertension    take labetalol  . Multiple sclerosis ([place]) 12/2017   Past Surgical History:  Procedure Laterality Date  . CESAREAN SECTION     x2   Family History: family history includes Breast cancer in her paternal grandmother; Heart disease in her father; Hyperlipidemia in her father; Hypertension in her father and mother. Social History:  reports that she has quit smoking. Her smoking use included cigars. She has never used smokeless tobacco. She reports current alcohol use. She reports previous drug use.     Maternal Diabetes: Yes:  Diabetes Type:  Insulin/Medication controlled Genetic Screening: Abnormal:  Results: Other:sma carriers Maternal Ultrasounds/Referrals: Normal Fetal Ultrasounds or other Referrals:  None Maternal Substance Abuse:  No Significant  Maternal Medications:  Meds include: Other: see HPI Significant Maternal Lab Results:  Group B Strep negative Other Comments:  None  Review of Systems  Constitutional: Positive for fatigue. Negative for activity change, appetite change, chills and diaphoresis.  Eyes: Negative for visual disturbance.  Respiratory: Negative for chest tightness and shortness of breath.   Cardiovascular: Positive for leg swelling. Negative for chest pain and palpitations.  Musculoskeletal: Positive for back pain.  Neurological: Negative for headaches.  Psychiatric/Behavioral: Negative for agitation and sleep disturbance. The patient is not nervous/anxious.    Maternal Medical History:  Reason for admission: Scheduled repeat cesarean section  Fetal activity: Perceived fetal activity is normal.   Last perceived fetal movement was within the past hour.    Prenatal complications: PIH.   Prenatal Complications - Diabetes: gestational. Diabetes is managed by insulin injections and oral agent (monotherapy).        There were no vitals taken for this visit. Maternal Exam:  Uterine Assessment: Contraction strength is mild.  Contraction frequency is rare.   Abdomen: Patient reports generalized tenderness.  Estimated fetal weight is AGA.   Fetal presentation: vertex  Introitus: Normal vulva. Normal vagina.    Physical Exam  Constitutional: She is oriented to person, place, and time. She appears well-developed and well-nourished.  Respiratory: Effort normal.  GI: Soft. There is generalized abdominal tenderness.  Genitourinary:  Vulva, vagina and uterus normal.   Musculoskeletal:        General: Normal range of motion.     Cervical back: Normal range of motion.  Neurological: She is alert and oriented to person, place, and time.  Skin: Skin is warm.  Psychiatric: Her behavior is normal. Judgment and thought content normal.    Prenatal labs: ABO, Rh:   Antibody:   Rubella:   RPR:    HBsAg:     HIV:    GBS:     Assessment/Plan: 39yo G 4P2012 female at 39 2/[redacted]wks gestation with gestational hypertension and gestational diabetes both with inadequate control on medication; history of cesarean section, requesting permanent sterilization  Admit NPO per ERAS Preop Ancef for surgery prophylaxis SCDs Labs: cbc, lfts; check covid sars Verify consent - to OR when ready  Izetta Sakamoto W Randal Yepiz 02/23/2019, 9:53 AM

## 2019-02-23 NOTE — Transfer of Care (Signed)
Immediate Anesthesia Transfer of Care Note  Patient: Victoria Ryan  Procedure(s) Performed: CESAREAN SECTION WITH BILATERAL TUBAL LIGATION (Bilateral )  Patient Location: PACU  Anesthesia Type:Spinal  Level of Consciousness: awake  Airway & Oxygen Therapy: Patient Spontanous Breathing  Post-op Assessment: Report given to RN and Post -op Vital signs reviewed and stable  Post vital signs: Reviewed and stable  Last Vitals:  Vitals Value Taken Time  BP 102/77 02/23/19 1617  Temp    Pulse 79 02/23/19 1620  Resp 18 02/23/19 1620  SpO2 100 % 02/23/19 1620  Vitals shown include unvalidated device data.  Last Pain:  Vitals:   02/23/19 1242  TempSrc: Oral         Complications: No apparent anesthesia complications

## 2019-02-24 LAB — CBC
HCT: 30.1 % — ABNORMAL LOW (ref 36.0–46.0)
Hemoglobin: 10.2 g/dL — ABNORMAL LOW (ref 12.0–15.0)
MCH: 30.6 pg (ref 26.0–34.0)
MCHC: 33.9 g/dL (ref 30.0–36.0)
MCV: 90.4 fL (ref 80.0–100.0)
Platelets: 206 10*3/uL (ref 150–400)
RBC: 3.33 MIL/uL — ABNORMAL LOW (ref 3.87–5.11)
RDW: 13.1 % (ref 11.5–15.5)
WBC: 7.4 10*3/uL (ref 4.0–10.5)
nRBC: 0 % (ref 0.0–0.2)

## 2019-02-24 LAB — GLUCOSE, CAPILLARY
Glucose-Capillary: 107 mg/dL — ABNORMAL HIGH (ref 70–99)
Glucose-Capillary: 103 mg/dL — ABNORMAL HIGH (ref 70–99)
Glucose-Capillary: 69 mg/dL — ABNORMAL LOW (ref 70–99)

## 2019-02-24 LAB — RPR: RPR Ser Ql: NONREACTIVE

## 2019-02-24 MED ORDER — INSULIN ASPART 100 UNIT/ML ~~LOC~~ SOLN: 0.0000 [IU] | Freq: Three times a day (TID) | SUBCUTANEOUS | Status: DC

## 2019-02-24 MED ORDER — LABETALOL HCL 200 MG PO TABS
200.0000 mg | ORAL_TABLET | Freq: Two times a day (BID) | ORAL | Status: DC
  Administered 2019-02-24 – 2019-02-25 (×2): 200 mg via ORAL
  Filled 2019-02-24 (×2): qty 1

## 2019-02-24 NOTE — Progress Notes (Signed)
POSTPARTUM POSTOP PROGRESS NOTE  POD #1  Subjective:  No acute events overnight.  Pt denies problems with ambulating, voiding or po intake.  She denies nausea or vomiting.  Pain is well controlled.  She has had flatus. She has not had bowel movement.  Lochia Minimal. S/p Foley, has already voided. Pumping, getting some colustrum. Denies PreE symptoms. Has not required insulin at this time  Objective: Blood pressure 139/88, pulse 83, temperature 98.7 F (37.1 C), temperature source Oral, resp. rate 16, height 5\' 4"  (1.626 m), weight 107.5 kg, SpO2 100 %, unknown if currently breastfeeding.  Physical Exam:  General: alert, cooperative and no distress Lochia:normal flow Chest: CTAB Heart: RRR no m/r/g Abdomen: +BS, soft, nontender Uterine Fundus: firm, 2cm below umbilicus. Pressure dressing intact, no drainage Extremities: neg edema, neg calf TTP BL, neg Homans BL  Recent Labs    02/23/19 1235 02/24/19 0626  HGB 13.0 10.2*  HCT 38.2 30.1*    Assessment/Plan:  ASSESSMENT: Victoria Ryan is a 39 y.o. 20 s/p ERLTCS @ [redacted]w[redacted]d for H/o csx x2. PNC c/b PreE w/o SF, GDMA2 prev on metformin and QHS insulin  1) Postop -Meeting early milestones -Encourage ambulation, IS -Pending BM -Reviewed pain control expectation -May shower today, pressure dressing can be removed at that time  2) PreE w/o SF -Prev on Lab 300mg  BID, currently on 100mg  BID -Asx, BP range 122-139/77-88, continue to monitor  3) GDMA2 -Continue BS checks, 72-104 postop -1/2 dose ordered - Lantus 35u QHS, held for now pending BG. Will monitor with PO intake today -Plan for 2h4 GTT @ postpartum appt  Circ today, pending payment by patient Breastfeeding, Lactation consult and Circumcision prior to discharge   LOS: 1 day

## 2019-02-24 NOTE — Progress Notes (Signed)
MOB was referred for history of depression/anxiety.  * Referral screened out by Clinical Social Worker because none of the following criteria appear to apply:  ~ History of anxiety/depression during this pregnancy, or of post-partum depression following prior delivery. ~ Diagnosis of anxiety and/or depression within last 3 years. Per chart review, MOB's anxiety/depression date back to 2014. No concerns noted in PNC records.  OR * MOB's symptoms currently being treated with medication and/or therapy.  Please contact the Clinical Social Worker if needs arise, by MOB request, or if MOB scores greater than 9/yes to question 10 on Edinburgh Postpartum Depression Screen.  Kaizlee Carlino, LCSW [facility] [phone]  

## 2019-02-24 NOTE — Progress Notes (Addendum)
Inpatient Diabetes Program Recommendations  AACE/ADA: New Consensus Statement on Inpatient Glycemic Control (2015)  Target Ranges:  Prepandial:   less than 140 mg/dL      Peak postprandial:   less than 180 mg/dL (1-2 hours)      Critically ill patients:  140 - 180 mg/dL   Lab Results  Component Value Date   GLUCAP 72 02/23/2019   HGBA1C 5.7 07/29/2018    Review of Glycemic Control Results for Victoria Ryan, Victoria Ryan (MRN [id number]) as of 02/24/2019 13:04  Ref. Range 02/23/2019 12:39 02/23/2019 16:25 02/23/2019 20:41  Glucose-Capillary Latest Ref Range: 70 - 99 mg/dL 379 (H) 81 72   Diabetes history: GDM Outpatient Diabetes medications: Lantus 70 units QHS, Metformin 1000 mg QHS Current orders for Inpatient glycemic control: Lantus 35 units QHS  Inpatient Diabetes Program Recommendations:    Verified patient was not insulin prior to pregnancy and confirmed GDM status. Given current glucose trends and lack of CBGs, consider discontinuing Lantus and adding Novolog 0-9 units TID & HS.  Additionally, would continue Metformin 1000 mg QHS at discharge.  Thanks, Lujean Rave, MSN, RNC-OB Diabetes Coordinator [phone] (8a-5p)

## 2019-02-24 NOTE — Lactation Note (Signed)
This note was copied from a baby's chart. Lactation Consultation Note  Patient Name: Victoria Ryan [id number] Date: 02/24/2019 Reason for consult: Follow-up assessment;Early term 37-38.6wks;1st time breastfeeding Type of Endocrine Disorder?: Diabetes  P3 mother whose infant is now 47 hours old.  This is an ETI at 37+2 weeks.  Mother did not breast feed her first two children (now 39 years old and 9 years old). Baby is DAT+.  Baby has been sleepy and has not latched well yet.  Mother stated she has initiated formula and baby consumed 30 mls of Similac 22 at the last bottle feeding.  Provided LPTI guidelines and educated mother on how to best assist baby to learn to latch and feed.  Reviewed breast feeding basics including STS, feeding cues (which baby is displaying), tummy size, supplementation guidelines, how to awaken a sleepy baby, hand expression and how to call for latch assistance as needed.  Suggested she call her RN/LC to observe the next feeding.  Asked mother to supplement with at least 10-20 mls after every breast feeding.  Mother verbalized understanding.    Mother has been able to express colostrum drops and is feeding drops back to baby.  Encouraged her to continue doing this before/after feedings to help increase milk supply.  She had a colostrum container at bedside.    Mother has a DEBP for home use.  No support person present at this time.      Maternal Data Formula Feeding for Exclusion: No Has patient been taught Hand Expression?: Yes Does the patient have breastfeeding experience prior to this delivery?: No  Feeding Feeding Type: Breast Fed  LATCH Score                   Interventions    Lactation Tools Discussed/Used Tools: Pump Breast pump type: Double-Electric Breast Pump Pump Review: Setup, frequency, and cleaning(Did not need to review)   Consult Status Consult Status: Follow-up Date: 02/25/19 Follow-up type: In-patient    [name] 02/24/2019, 8:40 PM

## 2019-02-25 LAB — GLUCOSE, CAPILLARY
Glucose-Capillary: 159 mg/dL — ABNORMAL HIGH (ref 70–99)
Glucose-Capillary: 122 mg/dL — ABNORMAL HIGH (ref 70–99)
Glucose-Capillary: 107 mg/dL — ABNORMAL HIGH (ref 70–99)

## 2019-02-25 LAB — SURGICAL PATHOLOGY

## 2019-02-25 MED ORDER — METFORMIN HCL 500 MG PO TABS: 1000.0000 mg | ORAL_TABLET | Freq: Every day | ORAL | Status: DC

## 2019-02-25 MED ORDER — LABETALOL HCL 100 MG PO TABS
100.0000 mg | ORAL_TABLET | Freq: Once | ORAL | Status: AC
  Administered 2019-02-25: 100 mg via ORAL
  Filled 2019-02-25: qty 1

## 2019-02-25 MED ORDER — LABETALOL HCL 100 MG PO TABS: 300.0000 mg | ORAL_TABLET | Freq: Two times a day (BID) | ORAL | 0 refills | Status: DC

## 2019-02-25 MED ORDER — IBUPROFEN 800 MG PO TABS: 800.0000 mg | ORAL_TABLET | Freq: Three times a day (TID) | ORAL | 1 refills | Status: DC | PRN

## 2019-02-25 MED ORDER — PRENATAL MULTIVITAMIN CH: 1.0000 | ORAL_TABLET | Freq: Every day | ORAL | 3 refills | Status: DC

## 2019-02-25 MED ORDER — METFORMIN HCL 500 MG PO TABS
500.0000 mg | ORAL_TABLET | Freq: Once | ORAL | Status: AC
  Administered 2019-02-25: 10:00:00 500 mg via ORAL
  Filled 2019-02-25: qty 1

## 2019-02-25 MED ORDER — METFORMIN HCL 500 MG PO TABS: 1000.0000 mg | ORAL_TABLET | Freq: Every day | ORAL | 1 refills | Status: DC

## 2019-02-25 MED ORDER — OXYCODONE HCL 5 MG PO TABS: 5.0000 mg | ORAL_TABLET | Freq: Four times a day (QID) | ORAL | 0 refills | Status: DC | PRN

## 2019-02-25 MED ORDER — LABETALOL HCL 100 MG PO TABS: 200.0000 mg | ORAL_TABLET | Freq: Two times a day (BID) | ORAL | 0 refills | Status: DC

## 2019-02-25 NOTE — Discharge Summary (Signed)
OB Discharge Summary     Patient Name: Victoria Ryan DOB: 1980/01/29 MRN: [id number]  Date of admission: 02/23/2019 Delivering MD: Pryor Ochoa [facility]   Date of discharge: 02/25/2019  Admitting diagnosis: Gestational hypertension w/o significant proteinuria in 3rd trimester [O13.3] Postpartum care following cesarean delivery [Z39.2] Intrauterine pregnancy: [redacted]w[redacted]d     Secondary diagnosis:  Active Problems:   Status post repeat low transverse cesarean section   Status post tubal ligation   Gestational hypertension w/o significant proteinuria in 3rd trimester   Postpartum care following cesarean delivery  Additional problems: Gestational Diabetes A2     Discharge diagnosis: Term Pregnancy Delivered                                                                                                Post partum procedures:N/A  Augmentation: N/A  Complications: None  Hospital course:  Sceduled C/S   39 y.o. yo [id number] at [redacted]w[redacted]d was admitted to the hospital 02/23/2019 for scheduled cesarean section with the following indication:Elective Repeat and PIH.  Membrane Rupture Time/Date: 3:20 PM ,02/23/2019   Patient delivered a Viable infant.02/23/2019  Details of operation can be found in separate operative note.  Pateint had an uncomplicated postpartum course.  She is ambulating, tolerating a regular diet, passing flatus, and urinating well. Patient is discharged home in stable condition on  02/25/19         Physical exam  Vitals:   02/24/19 0937 02/24/19 1505 02/24/19 2053 02/25/19 0620  BP: 139/88 (!) 155/86 (!) 142/92 (!) 149/91  Pulse: 83 89 88 85  Resp:   17 18  Temp:  97.7 F (36.5 C) 98.2 F (36.8 C) 98.2 F (36.8 C)  TempSrc:   Oral Oral  SpO2:   100% 100%  Weight:      Height:       General: alert and no distress Lochia: appropriate Uterine Fundus: firm Incision: Healing well with no significant drainage DVT Evaluation: No evidence of DVT seen on physical  exam. Labs: Lab Results  Component Value Date   WBC 7.4 02/24/2019   HGB 10.2 (L) 02/24/2019   HCT 30.1 (L) 02/24/2019   MCV 90.4 02/24/2019   PLT 206 02/24/2019   CMP Latest Ref Rng & Units 02/23/2019  Glucose 70 - 99 mg/dL -  BUN 6 - 20 mg/dL -  Creatinine 0.86 - 7.61 mg/dL -  Sodium 950 - 932 mmol/L -  Potassium 3.5 - 5.1 mmol/L -  Chloride 98 - 111 mmol/L -  CO2 22 - 32 mmol/L -  Calcium 8.9 - 10.3 mg/dL -  Total Protein 6.5 - 8.1 g/dL 6.9  Total Bilirubin 0.3 - 1.2 mg/dL 0.6  Alkaline Phos 38 - 126 U/L 98  AST 15 - 41 U/L 26  ALT 0 - 44 U/L 24    Discharge instruction: per After Visit Summary and "Baby and Me Booklet".  After visit meds:  Allergies as of 02/25/2019   No Known Allergies     Medication List    STOP taking these medications   aspirin 81 MG chewable tablet  folic acid 263 MCG tablet Commonly known as: FOLVITE   insulin glargine 100 UNIT/ML injection Commonly known as: LANTUS     TAKE these medications   ibuprofen 800 MG tablet Commonly known as: ADVIL Take 1 tablet (800 mg total) by mouth every 8 (eight) hours as needed.   labetalol 100 MG tablet Commonly known as: NORMODYNE Take 2 tablets (200 mg total) by mouth 2 (two) times daily. What changed: how much to take   metFORMIN 500 MG tablet Commonly known as: GLUCOPHAGE Take 2 tablets (1,000 mg total) by mouth at bedtime.   oxyCODONE 5 MG immediate release tablet Commonly known as: Oxy IR/ROXICODONE Take 1 tablet (5 mg total) by mouth every 6 (six) hours as needed for moderate pain.   prenatal multivitamin Tabs tablet Take 1 tablet by mouth daily at 12 noon.       Diet: routine diet  Activity: Advance as tolerated. Pelvic rest for 6 weeks.   Outpatient follow up:1,2 and 6 weeks Follow up Appt: Future Appointments  Date Time Provider [place]  04/14/2019  3:00 PM Sater, Nanine Means, MD [facility] None  07/30/2019  8:40 AM Biagio Borg, MD [id number] None   Follow up  Visit:No follow-ups on file.  Postpartum contraception: Tubal Ligation  Newborn Data: Live born female  Birth Weight: 6 lb 6.7 oz (2910 g) APGAR: 44, 9  Newborn Delivery   Birth date/time: 02/23/2019 15:20:00 Delivery type: C-Section, Low Transverse Trial of labor: No C-section categorization: Repeat      Baby Feeding: Bottle and Breast Disposition:home with mother   02/25/2019 Janyth Contes, MD

## 2019-02-25 NOTE — Progress Notes (Addendum)
Subjective: Postpartum Day 2: Cesarean Delivery Patient reports incisional pain, tolerating PO and no problems voiding.  Sugars elevated this AM - didn't get metformin dose last night as planned.  On Labetalol for BP.    Objective: Vital signs in last 24 hours: Temp:  [97.7 F (36.5 C)-98.2 F (36.8 C)] 98.2 F (36.8 C) (02/10 0620) Pulse Rate:  [83-89] 85 (02/10 0620) Resp:  [17-18] 18 (02/10 0620) BP: (139-155)/(86-92) 149/91 (02/10 0620) SpO2:  [100 %] 100 % (02/10 0620)  Physical Exam:  General: alert and no distress Lochia: appropriate Uterine Fundus: firm Incision: healing well DVT Evaluation: No evidence of DVT seen on physical exam.  Recent Labs    02/23/19 1235 02/24/19 0626  HGB 13.0 10.2*  HCT 38.2 30.1*    Assessment/Plan: Status post Cesarean section. Doing well postoperatively.  Continue current care. Write for metformin 1000mg  qhs, will give catch up dose this am.  (500mg ) Pt desires d/c to home - will d/c with Motrin, oxycodone, PNV, labetalol and metformin.  F/u 2 and 6 weeks Loza Prell Bovard-Stuckert 02/25/2019, 9:09 AM

## 2019-02-25 NOTE — Anesthesia Postprocedure Evaluation (Signed)
Anesthesia Post Note  Patient: Victoria Ryan  Procedure(s) Performed: CESAREAN SECTION WITH BILATERAL TUBAL LIGATION (Bilateral )     Patient location during evaluation: PACU Anesthesia Type: Spinal Level of consciousness: oriented and awake and alert Pain management: pain level controlled Vital Signs Assessment: post-procedure vital signs reviewed and stable Respiratory status: spontaneous breathing, respiratory function stable and patient connected to nasal cannula oxygen Cardiovascular status: blood pressure returned to baseline and stable Postop Assessment: no headache, no backache and no apparent nausea or vomiting Anesthetic complications: no    Last Vitals:  Vitals:   02/25/19 1120 02/25/19 1150  BP: (!) 141/95 133/79  Pulse: 76 84  Resp:    Temp:    SpO2:      Last Pain:  Vitals:   02/25/19 1345  TempSrc:   PainSc: 0-No pain   Pain Goal: Patients Stated Pain Goal: 3 (02/25/19 0940)                 Kerly Rigsbee S

## 2019-04-09 ENCOUNTER — Ambulatory Visit: Payer: 59

## 2019-04-09 ENCOUNTER — Ambulatory Visit: Payer: 59 | Attending: Internal Medicine

## 2019-04-09 DIAGNOSIS — Z23 Encounter for immunization: Secondary | ICD-10-CM

## 2019-04-09 NOTE — Progress Notes (Signed)
   Covid-19 Vaccination Clinic  Name:  Temia Debroux Fuente    MRN: [id number] DOB: [birth year]  04/09/2019  Ms. Neuharth was observed post Covid-19 immunization for 15 minutes without incident. She was provided with Vaccine Information Sheet and instruction to access the V-Safe system.   Ms. Heinze was instructed to call 911 with any severe reactions post vaccine: Marland Kitchen Difficulty breathing  . Swelling of face and throat  . A fast heartbeat  . A bad rash all over body  . Dizziness and weakness   Immunizations Administered    Name Date Dose VIS Date Route   Pfizer COVID-19 Vaccine 04/09/2019  4:32 PM 0.3 mL 12/26/2018 Intramuscular   Manufacturer: [employer], [employer]   Lot: [id number]   NDC: [address]-1

## 2019-04-14 ENCOUNTER — Ambulatory Visit (INDEPENDENT_AMBULATORY_CARE_PROVIDER_SITE_OTHER): Payer: 59 | Admitting: Neurology

## 2019-04-14 ENCOUNTER — Encounter: Payer: Self-pay | Admitting: Neurology

## 2019-04-14 ENCOUNTER — Telehealth: Payer: Self-pay | Admitting: *Deleted

## 2019-04-14 VITALS — BP 192/129 | HR 91 | Temp 97.3°F | Ht 64.0 in | Wt 220.5 lb

## 2019-04-14 DIAGNOSIS — R260 Ataxic gait: Secondary | ICD-10-CM

## 2019-04-14 DIAGNOSIS — G35 Multiple sclerosis: Secondary | ICD-10-CM | POA: Diagnosis not present

## 2019-04-14 DIAGNOSIS — Z79899 Other long term (current) drug therapy: Secondary | ICD-10-CM | POA: Diagnosis not present

## 2019-04-14 DIAGNOSIS — R350 Frequency of micturition: Secondary | ICD-10-CM

## 2019-04-14 DIAGNOSIS — R200 Anesthesia of skin: Secondary | ICD-10-CM | POA: Diagnosis not present

## 2019-04-14 DIAGNOSIS — I10 Essential (primary) hypertension: Secondary | ICD-10-CM

## 2019-04-14 DIAGNOSIS — R7303 Prediabetes: Secondary | ICD-10-CM

## 2019-04-14 NOTE — Progress Notes (Signed)
[facility]  PATIENT: Victoria Ryan DOB: 1980/06/12  REFERRING DOCTOR OR PCP: Referred by Caesar Chestnut NP  _________________________________   HISTORICAL  CHIEF COMPLAINT:  Chief Complaint  Patient presents with  . Follow-up    RM 13, alone. Last seen 09/11/18. Had baby boy, Legrand Como 02/2019.   . Multiple Sclerosis    HISTORY OF PRESENT ILLNESS:  Victoria Ryan is a 39 y.o. 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  Update 04/14/2019. She was off all any multiple sclerosis disease modifying therapy due to pregnancy.  She had been on Vumerity and tolerated it well.  She has now 7 weeks post delivery.    She is using both breast milk and formula and plans on breast feeding just a couple more weeks.    We discussed getting back on a DMT and that we could get the paperwork going while she was still breast feeding but should wait to starrt until she is done.    Her MS was stable during pregnancy and she had no exacerbations or new symptoms.  She feels her gait, strength and sensation are the same now as before MS.   Vision is fine.   Bladder function is fine.     She notes no fatigue but nothing getting in the way of doing what she needs to.   She notes no difficulty with mood or cognition.   She works in Therapist, art (now on maternity).and will be able to work form home.     While pregnant she had gestational diabetes and is still on metformin.    She had a tubal ligation with delivery.  Update 09/11/2018: Tylan stopped the Vumerity and gabapentin last month after discovering that she was pregnant.  She is 13 to 14 weeks.  She feels her MS has been stable and she notes no new symptoms.   Gait, strength, sensation are doing well.  Bladder function is fine.       She is planning on breastfeeding about 3-4 months.   We discussed that the pills are generally less compatible with breastfeeding than the IV or SQ/IM shot DMT.     Fatigue  is about the same.    Mood is doing okay..  She is not on any medication for anxiety and depression that she has noted in the past.  She sees [facility].     Update 05/08/2018 (Virtual) She feels her MS, diagnosed last year, is doing well.  She has no exacerbations or new neurologic symptoms.  She notes no problems with GI issues or flushing.      The numbness has resolved.  She is on gabapentin and feels it helped. She is walking well and is balancing well.   She can do heel to toe walking.  Strength is fine.  Vision is fine.  She notes some urinary urgency and frequency but no incontinence.     She feels her fatigue is doing better since starting the vitamin D supplements   Update 02/06/2018: She feels her numbness in the left face and right arm has improved but has not resolved.    She still has mild slurred speech but is better.    Balance is mildly off.   No falls  She had a contrasted MRI of the brain and the lesion showed peripheral enhancement with gadolinium.    No new lesions were seen.   She has 3 periventricular lesions, a couple other hemispheric  lesions and the large left pontine acute lesion.    The spinal cord showed no lesions.   VEP was normal.    I reviewed the MRIs in her presence and pointed out the foci consistent with MS and the enhancement of the pontine focus.    We had a discussion of different disease modifying therapies.  She is presenting with possibly higher than average aggressiveness with a large infratentorial focus in the pons.  Fortunately she does not also have spinal cord lesions.  I would like her to be on a fairly effective disease modifying therapy.  We discussed Vumerity, Mayzent, Tysabri and Ocrevus.  She was most interested in either Tysabri or Vumerity.     From 01/10/2019 She is a 39 yo woman who noted numbness on the right laeg and arm Monday 12/24/2017.   She was under a lot of stress and noted anxiety.   She saw her PCP  and was told that symptoms would likely improve after af few days off.     She also noted her voice was mildly slurred and the left face was num (also milder right face).   She did not note diplopia.   She had no problems with eating or swallowing.   She noted that her handwriting and typing were off.     When symptoms persisted she had a brain MRI 12/30/2017.  I reviewed the report and personally reviewed the images.  The brain MRI showed a large focus in the left pons and a smaller subcortical focus in the left parietal lobe.  Additionally there appeared to be a small periventricular focus on the left.  She had a couple of more nonspecific appearing T2/flair hyperintense foci in the subcortical or deep white matter of the hemispheres.  She also noted some difficulty with her right vision.   She has mildly reduced acuity and altered color vision out of that eye.    Currently about 50-60% better than 2 weeks ago.      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   She is pre-diabetic.    No FH of MS or other autoimmune disorder.       REVIEW OF SYSTEMS: Constitutional: No fevers, chills, sweats, or change in appetite.  She has insomnia. Eyes: No visual changes, double vision, eye pain Ear, nose and throat: No hearing loss, ear pain, nasal congestion, sore throat Cardiovascular: No chest pain, palpitations Respiratory: No shortness of breath at rest or with exertion.   No wheezes GastrointestinaI: No nausea, vomiting, diarrhea, abdominal pain, fecal incontinence Genitourinary:Urinary frequency and urgency with occasional urge incontinence.   Musculoskeletal: No neck pain, back pain Integumentary: No rash, pruritus, skin lesions Neurological: as above Psychiatric: No depression at this time.  No anxiety Endocrine: No palpitations, diaphoresis, change in appetite, change in weigh or increased thirst Hematologic/Lymphatic: No anemia, purpura,  petechiae. Allergic/Immunologic: No itchy/runny eyes, nasal congestion, recent allergic reactions, rashes  ALLERGIES: No Known Allergies  HOME MEDICATIONS:  Current Outpatient Medications:  .  ibuprofen (ADVIL) 800 MG tablet, Take 1 tablet (800 mg total) by mouth every 8 (eight) hours as needed., Disp: 45 tablet, Rfl: 1 .  labetalol (NORMODYNE) 100 MG tablet, Take 3 tablets (300 mg total) by mouth 2 (two) times daily., Disp: 180 tablet, Rfl: 0 .  metFORMIN (GLUCOPHAGE) 500 MG tablet, Take 2 tablets (1,000 mg total) by mouth at bedtime., Disp: 90 tablet, Rfl: 1 .  oxyCODONE (OXY IR/ROXICODONE) 5 MG immediate release  tablet, Take 1 tablet (5 mg total) by mouth every 6 (six) hours as needed for moderate pain., Disp: 30 tablet, Rfl: 0 .  Prenatal Vit-Fe Fumarate-FA (PRENATAL MULTIVITAMIN) TABS tablet, Take 1 tablet by mouth daily at 12 noon., Disp: 100 tablet, Rfl: 3  PAST MEDICAL HISTORY: Past Medical History:  Diagnosis Date  . Anxiety   . Chicken pox   . Gestational diabetes    takes insulin & metformin  . Hypertension    take labetalol  . Multiple sclerosis (HCC) 12/2017  . Status post tubal ligation 02/23/2019    PAST SURGICAL HISTORY: Past Surgical History:  Procedure Laterality Date  . CESAREAN SECTION     x2  . CESAREAN SECTION WITH BILATERAL TUBAL LIGATION Bilateral 02/23/2019   Procedure: CESAREAN SECTION WITH BILATERAL TUBAL LIGATION;  Rodin: Edwinna Areola, DO;  Location: [facility]S;  Service: Obstetrics;  Laterality: Bilateral;    FAMILY HISTORY: Family History  Problem Relation Age of Onset  . Hypertension Mother   . Heart disease Father   . Hyperlipidemia Father   . Hypertension Father   . Breast cancer Paternal Grandmother   . Cancer Neg Hx     SOCIAL HISTORY:  Social History   Socioeconomic History  . Marital status: Married    Spouse name: [name]  . Number of children: 2  . Years of education: 44  . Highest education level: Not on  file  Occupational History  . Occupation: Clinical biochemist Rep  Tobacco Use  . Smoking status: Former Smoker    Types: Cigars  . Smokeless tobacco: Never Used  Substance and Sexual Activity  . Alcohol use: Yes    Comment: occasionally wine  . Drug use: Not Currently  . Sexual activity: Yes    Birth control/protection: None  Other Topics Concern  . Not on file  Social History Narrative   Lives w/ husband and kids   Regular exercise-yes   Caffeine Use-yes   Right handed    Social Determinants of Health   Financial Resource Strain:   . Difficulty of Paying Living Expenses:   Food Insecurity:   . Worried About Programme researcher, broadcasting/film/video in the Last Year:   . Barista in the Last Year:   Transportation Needs:   . Freight forwarder (Medical):   Marland Kitchen Lack of Transportation (Non-Medical):   Physical Activity:   . Days of Exercise per Week:   . Minutes of Exercise per Session:   Stress:   . Feeling of Stress :   Social Connections:   . Frequency of Communication with Friends and Family:   . Frequency of Social Gatherings with Friends and Family:   . Attends Religious Services:   . Active Member of Clubs or Organizations:   . Attends Banker Meetings:   Marland Kitchen Marital Status:   Intimate Partner Violence:   . Fear of Current or Ex-Partner:   . Emotionally Abused:   Marland Kitchen Physically Abused:   . Sexually Abused:      PHYSICAL EXAM  Vitals:   04/14/19 1511  BP: (!) 192/129  Pulse: 91  Temp: (!) 97.3 F (36.3 C)  Weight: 220 lb 8 oz (100 kg)  Height: 5\' 4"  (1.626 m)    Body mass index is 37.85 kg/m.   General: The patient is well-developed and well-nourished and in no acute distress   Neurologic Exam  Mental status: The patient is alert and oriented x 3 at the time  of the examination. The patient has apparent normal recent and remote memory, with an apparently normal attention span and concentration ability.   Speech is normal.  Cranial nerves:  Extraocular movements are full.    Facial strength is normal.  Trapezius strength is normal. Hearing appears normal. .  Motor: Normal muscle tone and bulk.  Normal strength.  Sensory: She has intact sensation to touch, temperature and vibration.  Coordination: Cerebellar shows good finger-nose-finger and heel-to-shin  Gait and station: Station is normal.   Gait is normal. Tandem gait is mildly wide.  Romberg is negative.  Reflexes: Deep tendon reflexes are symmetric and normal bilaterally.      DIAGNOSTIC DATA (LABS, IMAGING, TESTING) - I reviewed patient records, labs, notes, testing and imaging myself where available.  Lab Results  Component Value Date   WBC 7.4 02/24/2019   HGB 10.2 (L) 02/24/2019   HCT 30.1 (L) 02/24/2019   MCV 90.4 02/24/2019   PLT 206 02/24/2019      Component Value Date/Time   NA 135 02/19/2019 1428   K 3.6 02/19/2019 1428   CL 104 02/19/2019 1428   CO2 18 (L) 02/19/2019 1428   GLUCOSE 89 02/19/2019 1428   BUN 15 02/19/2019 1428   CREATININE 0.62 02/19/2019 1428   CALCIUM 8.8 (L) 02/19/2019 1428   PROT 6.9 02/23/2019 1250   PROT 7.3 02/06/2018 1207   ALBUMIN 3.2 (L) 02/23/2019 1250   ALBUMIN 4.7 02/06/2018 1207   AST 26 02/23/2019 1250   ALT 24 02/23/2019 1250   ALKPHOS 98 02/23/2019 1250   BILITOT 0.6 02/23/2019 1250   BILITOT 0.3 02/06/2018 1207   GFRNONAA >60 02/19/2019 1428   GFRAA >60 02/19/2019 1428   Lab Results  Component Value Date   CHOL 182 07/29/2018   HDL 49.20 07/29/2018   LDLCALC 215 (H) 02/21/2017   LDLDIRECT 124.0 07/29/2018   TRIG 215.0 (H) 07/29/2018   CHOLHDL 4 07/29/2018   Lab Results  Component Value Date   HGBA1C 5.7 07/29/2018   Lab Results  Component Value Date   VITAMINB12 252 07/29/2018   Lab Results  Component Value Date   TSH 0.15 (L) 07/29/2018       ASSESSMENT AND PLAN    1. Multiple sclerosis (HCC)   2. High risk medication use   3. Ataxic gait   4. Numbness   5. Pre-diabetes   6.  Essential hypertension, benign     1.   We discussed disease modifying therapy options.  One option would be to get back on Vumerity.  However, she would prefer to be on one of the injection therapies to help with compliance.  We discussed Tysabri and Ocrevus.  She is going to read up on both of these.  We did discuss that if she chooses to go on Ocrevus that we are participating in a minority's study that she will likely qualify for.  We will check some blood work today and she will give this more thought over the next couple weeks. 2.   She plans on breastfeeding x just a couple more weeks.  We should be able to get her started back on a disease modifying therapy soon. 3.   Stay active and exercise as tolerated. 4.   Advised to get blood pressure checked outside of the office and call PCP if it stays  elevated.   5.Return in 6 months for regular visit or sooner if she decides to go into the drug study.  40-minute office  visit with the majority of the time face-to-face discussing therapeutic options and her MS history and symptoms.  Additional time with record review and documentation.  Kylia Grajales A. Epimenio Foot, MD, PhD, FAAN Certified in Neurology, Clinical Neurophysiology, Sleep Medicine, Pain Medicine and Neuroimaging Director, [facility] at [facility]  [facility] [address] [place], [place] [address] [phone]

## 2019-04-14 NOTE — Telephone Encounter (Signed)
Placed JCV lab in quest lock box for routine lab pick up. Results pending. 

## 2019-04-17 LAB — CBC WITH DIFFERENTIAL/PLATELET
Basophils Absolute: 0 10*3/uL (ref 0.0–0.2)
Basos: 1 %
EOS (ABSOLUTE): 0.1 10*3/uL (ref 0.0–0.4)
Eos: 1 %
Hematocrit: 37.4 % (ref 34.0–46.6)
Hemoglobin: 12.9 g/dL (ref 11.1–15.9)
Immature Grans (Abs): 0 10*3/uL (ref 0.0–0.1)
Immature Granulocytes: 0 %
Lymphocytes Absolute: 2.3 10*3/uL (ref 0.7–3.1)
Lymphs: 36 %
MCH: 29.4 pg (ref 26.6–33.0)
MCHC: 34.5 g/dL (ref 31.5–35.7)
MCV: 85 fL (ref 79–97)
Monocytes Absolute: 0.5 10*3/uL (ref 0.1–0.9)
Monocytes: 8 %
Neutrophils Absolute: 3.5 10*3/uL (ref 1.4–7.0)
Neutrophils: 54 %
Platelets: 343 10*3/uL (ref 150–450)
RBC: 4.39 x10E6/uL (ref 3.77–5.28)
RDW: 12.9 % (ref 11.7–15.4)
WBC: 6.5 10*3/uL (ref 3.4–10.8)

## 2019-04-17 LAB — HEPATITIS B SURFACE ANTIGEN: Hepatitis B Surface Ag: NEGATIVE

## 2019-04-17 LAB — QUANTIFERON-TB GOLD PLUS
QuantiFERON Mitogen Value: >10 IU/mL
QuantiFERON Nil Value: 0.04 IU/mL
QuantiFERON TB1 Ag Value: 0.05 IU/mL
QuantiFERON TB2 Ag Value: 0.05 IU/mL
QuantiFERON-TB Gold Plus: NEGATIVE

## 2019-04-17 LAB — HEPATITIS B SURFACE ANTIBODY,QUALITATIVE: Hep B Surface Ab, Qual: NONREACTIVE

## 2019-04-17 LAB — HEPATITIS B CORE ANTIBODY, TOTAL: Hep B Core Total Ab: NEGATIVE

## 2019-04-23 NOTE — Telephone Encounter (Signed)
JCV ab drawn on 04/14/19 positive, index: 3.02. Gave to MD to review.

## 2019-04-24 ENCOUNTER — Telehealth: Payer: Self-pay | Admitting: Neurology

## 2019-04-24 NOTE — Telephone Encounter (Signed)
I called to go over the results of the blood work.  The JCV antibody was high positive so Tysabri would not be a good option for her.  The rest of the labs were fine and Ocrevus could be an option for [place].  I will try to reach her on Monday and see if she is interested in starting Ocrevus.  If she is she might also be interested in the chimes open label study.Marland Kitchen

## 2019-04-27 NOTE — Telephone Encounter (Signed)
I discussed the blood work.  She would like to begin Ocrevus.  She is potentially interested in enrolling in the CHIMES open label drug study and I will let our research staff know.

## 2019-05-04 ENCOUNTER — Ambulatory Visit: Payer: 59 | Attending: Internal Medicine

## 2019-05-04 DIAGNOSIS — Z23 Encounter for immunization: Secondary | ICD-10-CM

## 2019-05-04 NOTE — Progress Notes (Signed)
   Covid-19 Vaccination Clinic  Name:  Victoria Ryan    MRN: [id number] DOB: 30-Oct-1980  05/04/2019  [name] was observed post Covid-19 immunization for 15 minutes without incident. She was provided with Vaccine Information Sheet and instruction to access the V-Safe system.   [name] was instructed to call 911 with any severe reactions post vaccine: Marland Kitchen Difficulty breathing  . Swelling of face and throat  . A fast heartbeat  . A bad rash all over body  . Dizziness and weakness   Immunizations Administered    Name Date Dose VIS Date Route   Pfizer COVID-19 Vaccine 05/04/2019  5:01 PM 0.3 mL 03/11/2018 Intramuscular   Manufacturer: [employer], [employer]   Lot: [id number]   NDC: [address]-6

## 2019-06-02 ENCOUNTER — Other Ambulatory Visit: Payer: Self-pay | Admitting: Neurology

## 2019-06-02 DIAGNOSIS — R350 Frequency of micturition: Secondary | ICD-10-CM

## 2019-06-02 NOTE — Addendum Note (Signed)
Addended by: Tamera Stands D on: 06/02/2019 10:48 AM   Modules accepted: Orders

## 2019-06-04 LAB — URINE CULTURE

## 2019-06-09 ENCOUNTER — Other Ambulatory Visit: Payer: Self-pay | Admitting: Neurology

## 2019-06-09 DIAGNOSIS — G35 Multiple sclerosis: Secondary | ICD-10-CM

## 2019-06-13 ENCOUNTER — Ambulatory Visit
Admission: RE | Admit: 2019-06-13 | Discharge: 2019-06-13 | Disposition: A | Discharge status: Home / Self Care | Payer: 59 | Source: Ambulatory Visit | Attending: Neurology | Admitting: Neurology

## 2019-06-13 ENCOUNTER — Other Ambulatory Visit: Payer: Self-pay

## 2019-06-13 DIAGNOSIS — G35 Multiple sclerosis: Secondary | ICD-10-CM

## 2019-06-13 MED ORDER — GADOBENATE DIMEGLUMINE 529 MG/ML IV SOLN
20.0000 mL | Freq: Once | INTRAVENOUS | Status: AC | PRN
  Administered 2019-06-13: 20 mL via INTRAVENOUS

## 2019-06-16 ENCOUNTER — Other Ambulatory Visit: Payer: Self-pay | Admitting: Neurology

## 2019-06-16 DIAGNOSIS — G35 Multiple sclerosis: Secondary | ICD-10-CM

## 2019-06-17 ENCOUNTER — Other Ambulatory Visit: Payer: Self-pay

## 2019-06-17 ENCOUNTER — Ambulatory Visit
Admission: RE | Admit: 2019-06-17 | Discharge: 2019-06-17 | Disposition: A | Discharge status: Home / Self Care | Payer: Self-pay | Source: Ambulatory Visit | Attending: Neurology | Admitting: Neurology

## 2019-06-17 DIAGNOSIS — G35 Multiple sclerosis: Secondary | ICD-10-CM

## 2019-06-17 MED ORDER — GADOTERIDOL 279.3 MG/ML IV SOLN
18.0000 mL | Freq: Once | INTRAVENOUS | Status: AC | PRN
  Administered 2019-06-17: 18 mL via INTRAVENOUS

## 2019-06-19 ENCOUNTER — Other Ambulatory Visit: Payer: Self-pay | Admitting: Neurology

## 2019-06-19 DIAGNOSIS — G35 Multiple sclerosis: Secondary | ICD-10-CM

## 2019-06-24 ENCOUNTER — Inpatient Hospital Stay: Admission: RE | Admit: 2019-06-24 | Payer: No Typology Code available for payment source | Source: Ambulatory Visit

## 2019-06-29 ENCOUNTER — Encounter: Payer: Self-pay | Admitting: Internal Medicine

## 2019-06-29 ENCOUNTER — Other Ambulatory Visit: Payer: Self-pay

## 2019-06-29 ENCOUNTER — Ambulatory Visit (INDEPENDENT_AMBULATORY_CARE_PROVIDER_SITE_OTHER): Payer: 59 | Admitting: Internal Medicine

## 2019-06-29 VITALS — BP 190/140 | HR 111 | Temp 98.5°F | Ht 64.0 in | Wt 213.0 lb

## 2019-06-29 DIAGNOSIS — I10 Essential (primary) hypertension: Secondary | ICD-10-CM | POA: Diagnosis not present

## 2019-06-29 DIAGNOSIS — Z1159 Encounter for screening for other viral diseases: Secondary | ICD-10-CM | POA: Diagnosis not present

## 2019-06-29 DIAGNOSIS — Z0000 Encounter for general adult medical examination without abnormal findings: Secondary | ICD-10-CM | POA: Diagnosis not present

## 2019-06-29 DIAGNOSIS — R7303 Prediabetes: Secondary | ICD-10-CM

## 2019-06-29 LAB — CBC WITH DIFFERENTIAL/PLATELET
Basophils Absolute: 0 10*3/uL (ref 0.0–0.1)
Basophils Relative: 0.5 % (ref 0.0–3.0)
Eosinophils Absolute: 0.1 10*3/uL (ref 0.0–0.7)
Eosinophils Relative: 1 % (ref 0.0–5.0)
HCT: 41.9 % (ref 36.0–46.0)
Hemoglobin: 14 g/dL (ref 12.0–15.0)
Lymphocytes Relative: 36.7 % (ref 12.0–46.0)
Lymphs Abs: 2.5 10*3/uL (ref 0.7–4.0)
MCHC: 33.5 g/dL (ref 30.0–36.0)
MCV: 88 fl (ref 78.0–100.0)
Monocytes Absolute: 0.4 10*3/uL (ref 0.1–1.0)
Monocytes Relative: 6 % (ref 3.0–12.0)
Neutro Abs: 3.9 10*3/uL (ref 1.4–7.7)
Neutrophils Relative %: 55.8 % (ref 43.0–77.0)
Platelets: 317 10*3/uL (ref 150.0–400.0)
RBC: 4.76 Mil/uL (ref 3.87–5.11)
RDW: 15.5 % (ref 11.5–15.5)
WBC: 6.9 10*3/uL (ref 4.0–10.5)

## 2019-06-29 LAB — HEPATIC FUNCTION PANEL
ALT: 27 U/L (ref 0–35)
AST: 20 U/L (ref 0–37)
Albumin: 4.6 g/dL (ref 3.5–5.2)
Alkaline Phosphatase: 109 U/L (ref 39–117)
Bilirubin, Direct: 0.1 mg/dL (ref 0.0–0.3)
Total Bilirubin: 0.4 mg/dL (ref 0.2–1.2)
Total Protein: 7.7 g/dL (ref 6.0–8.3)

## 2019-06-29 LAB — BASIC METABOLIC PANEL
BUN: 16 mg/dL (ref 6–23)
CO2: 25 mEq/L (ref 19–32)
Calcium: 9.4 mg/dL (ref 8.4–10.5)
Chloride: 103 mEq/L (ref 96–112)
Creatinine, Ser: 0.77 mg/dL (ref 0.40–1.20)
GFR: 100.82 mL/min (ref >60.00)
Glucose, Bld: 104 mg/dL — ABNORMAL HIGH (ref 70–99)
Potassium: 3.8 mEq/L (ref 3.5–5.1)
Sodium: 136 mEq/L (ref 135–145)

## 2019-06-29 LAB — TSH: TSH: 0.53 u[IU]/mL (ref 0.35–4.50)

## 2019-06-29 LAB — HEMOGLOBIN A1C: Hgb A1c MFr Bld: 6.1 % (ref 4.6–6.5)

## 2019-06-29 LAB — LIPID PANEL
Cholesterol: 296 mg/dL — ABNORMAL HIGH (ref 0–200)
HDL: 58.3 mg/dL (ref >39.00)
LDL Cholesterol: 202 mg/dL — ABNORMAL HIGH (ref 0–99)
NonHDL: 237.39
Total CHOL/HDL Ratio: 5
Triglycerides: 175 mg/dL — ABNORMAL HIGH (ref 0.0–149.0)
VLDL: 35 mg/dL (ref 0.0–40.0)

## 2019-06-29 MED ORDER — POTASSIUM CHLORIDE ER 10 MEQ PO TBCR: 10.0000 meq | EXTENDED_RELEASE_TABLET | Freq: Every day | ORAL | 3 refills | Status: DC

## 2019-06-29 MED ORDER — AMLODIPINE BESYLATE 10 MG PO TABS: 10.0000 mg | ORAL_TABLET | Freq: Every day | ORAL | 3 refills | Status: DC

## 2019-06-29 MED ORDER — OLMESARTAN MEDOXOMIL 40 MG PO TABS: 40.0000 mg | ORAL_TABLET | Freq: Every day | ORAL | 3 refills | Status: DC

## 2019-06-29 MED ORDER — HYDROCHLOROTHIAZIDE 25 MG PO TABS: 25.0000 mg | ORAL_TABLET | Freq: Every day | ORAL | 3 refills | Status: DC

## 2019-06-29 NOTE — Progress Notes (Signed)
Subjective:    Patient ID: Victoria Ryan, female    DOB: September 16, 1980, 39 y.o.   MRN: [id number]  HPI  Here for wellness and f/u;  Overall doing ok;  Pt denies Chest pain, worsening SOB, DOE, wheezing, orthopnea, PND, worsening LE edema, palpitations, dizziness or syncope.  Pt denies neurological change such as new headache, facial or extremity weakness.  Pt denies polydipsia, polyuria, or low sugar symptoms. Pt states overall good compliance with treatment and medications, good tolerability, and has been trying to follow appropriate diet.  Pt denies worsening depressive symptoms, suicidal ideation or panic. No fever, night sweats, wt loss, loss of appetite, or other constitutional symptoms.  Pt states good ability with ADL's, has low fall risk, home safety reviewed and adequate, no other significant changes in hearing or vision, and only occasionally active with exercise.  BP has been severely elevated for several wks despite compliance with labetolol 100 bid  Was not able to have MS tx recently due to elevated pressure BP Readings from Last 3 Encounters:  06/29/19 (!) 190/140  04/14/19 (!) 192/129  02/25/19 133/79   Wt Readings from Last 3 Encounters:  06/29/19 213 lb (96.6 kg)  04/14/19 220 lb 8 oz (100 kg)  02/23/19 237 lb (107.5 kg)   Past Medical History:  Diagnosis Date  . Anxiety   . Chicken pox   . Gestational diabetes    takes insulin & metformin  . Hypertension    take labetalol  . Multiple sclerosis (HCC) 12/2017  . Status post tubal ligation 02/23/2019   Past Surgical History:  Procedure Laterality Date  . CESAREAN SECTION     x2  . CESAREAN SECTION WITH BILATERAL TUBAL LIGATION Bilateral 02/23/2019   Procedure: CESAREAN SECTION WITH BILATERAL TUBAL LIGATION;  Helser: Edwinna Areola, DO;  Location: [facility] LD [facility]S;  Service: Obstetrics;  Laterality: Bilateral;    reports that she has quit smoking. Her smoking use included cigars. She has never used smokeless tobacco.  She reports current alcohol use. She reports previous drug use. family history includes Breast cancer in her paternal grandmother; Heart disease in her father; Hyperlipidemia in her father; Hypertension in her father and mother. No Known Allergies Current Outpatient Medications on File Prior to Visit  Medication Sig Dispense Refill  . metFORMIN (GLUCOPHAGE) 500 MG tablet Take 2 tablets (1,000 mg total) by mouth at bedtime. 90 tablet 1  . Prenatal Vit-Fe Fumarate-FA (PRENATAL MULTIVITAMIN) TABS tablet Take 1 tablet by mouth daily at 12 noon. 100 tablet 3   No current facility-administered medications on file prior to visit.   Review of Systems All otherwise neg per pt    Objective:   Physical Exam BP (!) 190/140 (BP Location: Left Arm, Patient Position: Sitting, Cuff Size: Large)   Pulse (!) 111   Temp 98.5 F (36.9 C) (Oral)   Ht 5\' 4"  (1.626 m)   Wt 213 lb (96.6 kg)   SpO2 99%   BMI 36.56 kg/m  VS noted,  Constitutional: Pt appears in NAD HENT: Head: NCAT.  Right Ear: External ear normal.  Left Ear: External ear normal.  Eyes: . Pupils are equal, round, and reactive to light. Conjunctivae and EOM are normal Nose: without d/c or deformity Neck: Neck supple. Gross normal ROM Cardiovascular: Normal rate and regular rhythm.   Pulmonary/Chest: Effort normal and breath sounds without rales or wheezing.  Abd:  Soft, NT, ND, + BS, no organomegaly Neurological: Pt is alert. At baseline orientation, motor grossly  intact Skin: Skin is warm. No rashes, other new lesions, no LE edema Psychiatric: Pt behavior is normal without agitation  All otherwise neg per pt Lab Results  Component Value Date   WBC 6.9 06/29/2019   HGB 14.0 06/29/2019   HCT 41.9 06/29/2019   PLT 317.0 06/29/2019   GLUCOSE 104 (H) 06/29/2019   CHOL 296 (H) 06/29/2019   TRIG 175.0 (H) 06/29/2019   HDL 58.30 06/29/2019   LDLDIRECT 124.0 07/29/2018   LDLCALC 202 (H) 06/29/2019   ALT 27 06/29/2019   AST 20  06/29/2019   NA 136 06/29/2019   K 3.8 06/29/2019   CL 103 06/29/2019   CREATININE 0.77 06/29/2019   BUN 16 06/29/2019   CO2 25 06/29/2019   TSH 0.53 06/29/2019   HGBA1C 6.1 06/29/2019      Assessment & Plan:

## 2019-06-29 NOTE — Patient Instructions (Signed)
Please take all new medication as prescribed - the 3 BP pills and the potassium pill  Please call in 2 weeks with your blood pressure average, with the goal being to be less than 140/90  Please continue all other medications as before, and refills have been done if requested.  Please have the pharmacy call with any other refills you may need.  Please continue your efforts at being more active, low cholesterol diet, and weight control.  You are otherwise up to date with prevention measures today.  Please keep your appointments with your specialists as you may have planned  Please go to the LAB at the blood drawing area for the tests to be done  You will be contacted by phone if any changes need to be made immediately.  Otherwise, you will receive a letter about your results with an explanation, but please check with MyChart first.  Please remember to sign up for MyChart if you have not done so, as this will be important to you in the future with finding out test results, communicating by private email, and scheduling acute appointments online when needed.  Please make an Appointment to return in 6 months, or sooner if needed

## 2019-06-29 NOTE — Assessment & Plan Note (Signed)
Severe uncontrolled, for start amlodpine 10, olmesartan 40, and hct 25, with f/u BP at home and next visit

## 2019-06-29 NOTE — Assessment & Plan Note (Signed)

## 2019-06-29 NOTE — Assessment & Plan Note (Signed)
For a1c with labs 

## 2019-06-30 ENCOUNTER — Ambulatory Visit: Payer: No Typology Code available for payment source | Admitting: Internal Medicine

## 2019-06-30 LAB — HEPATITIS C ANTIBODY
Hepatitis C Ab: NONREACTIVE
SIGNAL TO CUT-OFF: 0.13 (ref <1.00)

## 2019-07-09 ENCOUNTER — Encounter: Payer: Self-pay | Admitting: Internal Medicine

## 2019-07-14 ENCOUNTER — Other Ambulatory Visit: Payer: 59

## 2019-07-30 ENCOUNTER — Encounter: Payer: Self-pay | Admitting: Internal Medicine

## 2019-07-30 ENCOUNTER — Ambulatory Visit (INDEPENDENT_AMBULATORY_CARE_PROVIDER_SITE_OTHER): Payer: 59 | Admitting: Internal Medicine

## 2019-07-30 ENCOUNTER — Other Ambulatory Visit: Payer: Self-pay

## 2019-07-30 VITALS — BP 140/86 | HR 97 | Temp 98.1°F | Ht 64.0 in | Wt 209.0 lb

## 2019-07-30 DIAGNOSIS — I10 Essential (primary) hypertension: Secondary | ICD-10-CM | POA: Diagnosis not present

## 2019-07-30 DIAGNOSIS — R7303 Prediabetes: Secondary | ICD-10-CM | POA: Diagnosis not present

## 2019-07-30 DIAGNOSIS — F419 Anxiety disorder, unspecified: Secondary | ICD-10-CM | POA: Diagnosis not present

## 2019-07-30 DIAGNOSIS — F329 Major depressive disorder, single episode, unspecified: Secondary | ICD-10-CM

## 2019-07-30 DIAGNOSIS — Z0289 Encounter for other administrative examinations: Secondary | ICD-10-CM

## 2019-07-30 DIAGNOSIS — E785 Hyperlipidemia, unspecified: Secondary | ICD-10-CM | POA: Diagnosis not present

## 2019-07-30 DIAGNOSIS — Z0000 Encounter for general adult medical examination without abnormal findings: Secondary | ICD-10-CM

## 2019-07-30 DIAGNOSIS — F32A Depression, unspecified: Secondary | ICD-10-CM

## 2019-07-30 NOTE — Patient Instructions (Signed)
Please continue all other medications as before, and refills have been done if requested.  Please have the pharmacy call with any other refills you may need.  Please continue your efforts at being more active, low cholesterol diet, and weight control.  Please keep your appointments with your specialists as you may have planned  Please make an Appointment to return for your 1 year visit, or sooner if needed, with Lab testing by Appointment as well, to be done about 3-5 days before at the FIRST FLOOR Lab (so this is for TWO appointments - please see the scheduling desk as you leave)    

## 2019-07-30 NOTE — Progress Notes (Signed)
Subjective:    Patient ID: Victoria Ryan, female    DOB: 1980-11-14, 39 y.o.   MRN: [id number]  HPI  Here to f/u; overall doing ok,  Pt denies chest pain, increasing sob or doe, wheezing, orthopnea, PND, increased LE swelling, palpitations, dizziness or syncope.  Pt denies new neurological symptoms such as new headache, or facial or extremity weakness or numbness.  Pt denies polydipsia, polyuria, or low sugar episode.  Pt states overall good compliance with meds, mostly trying to follow appropriate diet, with wt overall stable,  but little exercise however. No new complaints  Denies worsening depressive symptoms, suicidal ideation, or panic; has ongoing anxiety, not increased recently.  Past Medical History:  Diagnosis Date  . Anxiety   . Chicken pox   . Gestational diabetes    takes insulin & metformin  . Hypertension    take labetalol  . Multiple sclerosis (HCC) 12/2017  . Status post tubal ligation 02/23/2019   Past Surgical History:  Procedure Laterality Date  . CESAREAN SECTION     x2  . CESAREAN SECTION WITH BILATERAL TUBAL LIGATION Bilateral 02/23/2019   Procedure: CESAREAN SECTION WITH BILATERAL TUBAL LIGATION;  Rapozo: Edwinna Areola, DO;  Location: [facility];  Service: Obstetrics;  Laterality: Bilateral;    reports that she has quit smoking. Her smoking use included cigars. She has never used smokeless tobacco. She reports current alcohol use. She reports previous drug use. family history includes Breast cancer in her paternal grandmother; Heart disease in her father; Hyperlipidemia in her father; Hypertension in her father and mother. No Known Allergies Current Outpatient Medications on File Prior to Visit  Medication Sig Dispense Refill  . amLODipine (NORVASC) 10 MG tablet Take 1 tablet (10 mg total) by mouth daily. 90 tablet 3  . hydrochlorothiazide (HYDRODIURIL) 25 MG tablet Take 1 tablet (25 mg total) by mouth daily. 90 tablet 3  . metFORMIN (GLUCOPHAGE) 500 MG  tablet Take 2 tablets (1,000 mg total) by mouth at bedtime. 90 tablet 1  . olmesartan (BENICAR) 40 MG tablet Take 1 tablet (40 mg total) by mouth daily. 90 tablet 3  . potassium chloride (KLOR-CON) 10 MEQ tablet Take 1 tablet (10 mEq total) by mouth daily. 90 tablet 3  . Prenatal Vit-Fe Fumarate-FA (PRENATAL MULTIVITAMIN) TABS tablet Take 1 tablet by mouth daily at 12 noon. 100 tablet 3   No current facility-administered medications on file prior to visit.   Review of Systems All otherwise neg per pt    Objective:   Physical Exam BP 140/86 (BP Location: Left Arm, Patient Position: Sitting, Cuff Size: Large)   Pulse 97   Temp 98.1 F (36.7 C) (Oral)   Ht 5\' 4"  (1.626 m)   Wt 209 lb (94.8 kg)   SpO2 99%   BMI 35.87 kg/m  VS noted,  Constitutional: Pt appears in NAD HENT: Head: NCAT.  Right Ear: External ear normal.  Left Ear: External ear normal.  Eyes: . Pupils are equal, round, and reactive to light. Conjunctivae and EOM are normal Nose: without d/c or deformity Neck: Neck supple. Gross normal ROM Cardiovascular: Normal rate and regular rhythm.   Pulmonary/Chest: Effort normal and breath sounds without rales or wheezing.  Abd:  Soft, NT, ND, + BS, no organomegaly Neurological: Pt is alert. At baseline orientation, motor grossly intact Skin: Skin is warm. No rashes, other new lesions, no LE edema Psychiatric: Pt behavior is normal without agitation  All otherwise neg per pt Lab Results  Component Value Date   WBC 6.9 06/29/2019   HGB 14.0 06/29/2019   HCT 41.9 06/29/2019   PLT 317.0 06/29/2019   GLUCOSE 104 (H) 06/29/2019   CHOL 296 (H) 06/29/2019   TRIG 175.0 (H) 06/29/2019   HDL 58.30 06/29/2019   LDLDIRECT 124.0 07/29/2018   LDLCALC 202 (H) 06/29/2019   ALT 27 06/29/2019   AST 20 06/29/2019   NA 136 06/29/2019   K 3.8 06/29/2019   CL 103 06/29/2019   CREATININE 0.77 06/29/2019   BUN 16 06/29/2019   CO2 25 06/29/2019   TSH 0.53 06/29/2019   HGBA1C 6.1  06/29/2019      Assessment & Plan:

## 2019-08-01 ENCOUNTER — Encounter: Payer: Self-pay | Admitting: Internal Medicine

## 2019-08-01 NOTE — Assessment & Plan Note (Signed)
stable overall by history and exam, recent data reviewed with pt, and pt to continue medical treatment as before,  to f/u any worsening symptoms or concerns  

## 2019-08-01 NOTE — Assessment & Plan Note (Addendum)
stable overall by history and exam, recent data reviewed with pt, and pt to continue medical treatment as before,  to f/u any worsening symptoms or concerns  I spent 31 minutes in preparing to see the patient by review of recent labs, imaging and procedures, obtaining and reviewing separately obtained history, communicating with the patient and family or caregiver, ordering medications, tests or procedures, and documenting clinical information in the EHR including the differential Dx, treatment, and any further evaluation and other management of preDM, anxiety depression, htn, hld

## 2019-08-12 ENCOUNTER — Telehealth: Payer: Self-pay | Admitting: *Deleted

## 2019-08-12 NOTE — Telephone Encounter (Signed)
I re faxed pt form to [employer] [phone]

## 2019-12-17 ENCOUNTER — Other Ambulatory Visit: Payer: Self-pay

## 2019-12-17 ENCOUNTER — Ambulatory Visit
Admission: RE | Admit: 2019-12-17 | Discharge: 2019-12-17 | Disposition: A | Discharge status: Home / Self Care | Payer: Self-pay | Source: Ambulatory Visit | Attending: Neurology | Admitting: Neurology

## 2019-12-17 DIAGNOSIS — G35 Multiple sclerosis: Secondary | ICD-10-CM

## 2019-12-17 MED ORDER — GADOTERIDOL 279.3 MG/ML IV SOLN
18.0000 mL | Freq: Once | INTRAVENOUS | Status: AC | PRN
  Administered 2019-12-17: 18 mL via INTRAVENOUS

## 2020-03-10 IMAGING — MR MR HEAD W/O CM
10 series · 48 of 48 positions shown · non-contrast
Comparison: None.

CLINICAL DATA: Two week history of right-sided facial numbness.
History of hypertension.

EXAM:
MRI HEAD WITHOUT CONTRAST
TECHNIQUE: Multiplanar, multiecho pulse sequences of the brain and surrounding
structures were obtained without intravenous contrast.

[Series 5: T1 · sagittal · 4.0mm · 0.75mm/px · 3 of 31 slices shown (1 of 2)]
[im 1/31]
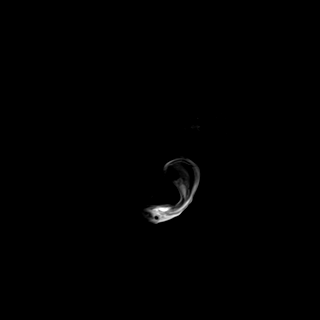
[im 16/31]
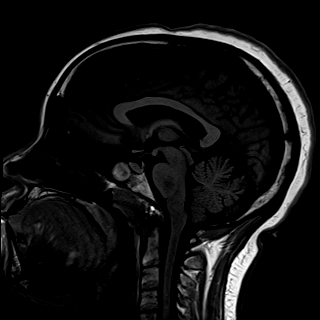
[im 31/31]
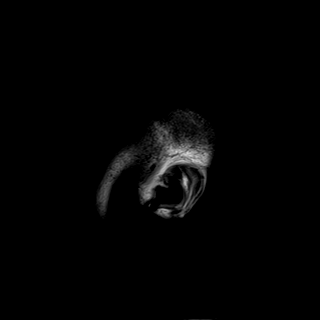

[Series 6: DWI · axial · 3.0mm · 1.50mm/px · z∈[-73,+78]mm · 7 of 94 slices shown (1 of 4)]
[im 1/94]
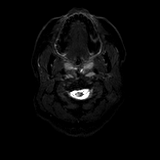
[im 16/94]
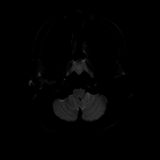
[im 32/94]
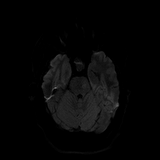
[im 47/94]
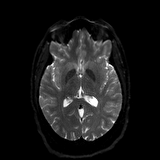
[im 63/94]
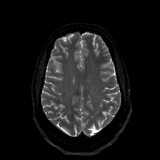
[im 78/94]
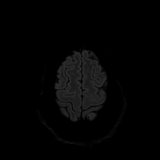
[im 94/94]
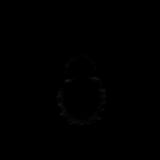

[Series 7: DWI · axial · 3.0mm · 1.50mm/px · z∈[-73,+78]mm · 4 of 47 slices shown (2 of 4)]
[im 1/47]
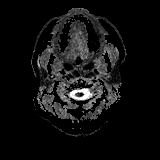
[im 16/47]
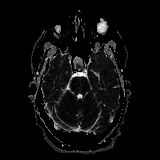
[im 31/47]
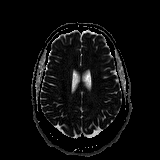
[im 47/47]
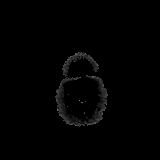

[Series 8: DWI · coronal · 5.0mm · 1.44mm/px · 5 of 70 slices shown (3 of 4)]
[im 1/70]
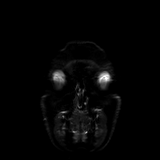
[im 18/70]
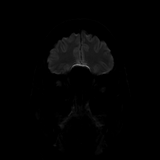
[im 35/70]
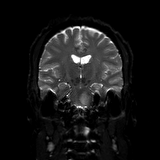
[im 52/70]
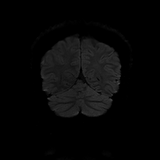
[im 70/70]
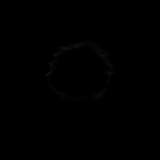

[Series 9: DWI · coronal · 5.0mm · 1.44mm/px · 3 of 35 slices shown (4 of 4)]
[im 1/35]
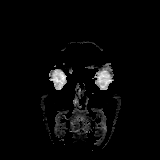
[im 18/35]
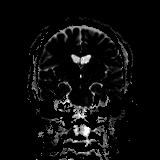
[im 35/35]
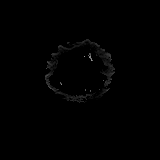

[Series 10: T2 · axial · 4.0mm · 0.36mm/px · z∈[-75,+80]mm · 2 of 31 slices shown (1 of 2)]
[im 1/31]
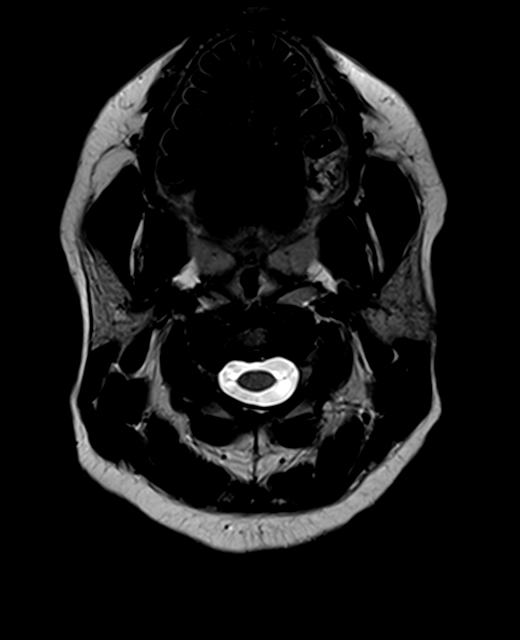
[im 31/31]
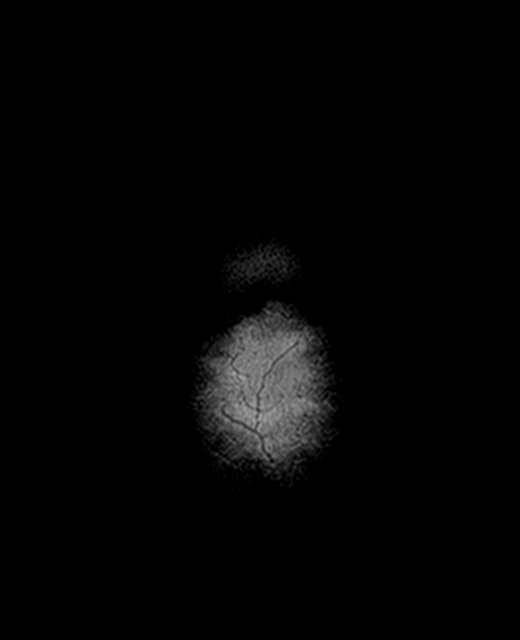

[Series 11: FLAIR · axial · 3.0mm · 0.72mm/px · z∈[-71,+78]mm · 2 of 26 slices shown]
[im 1/26]
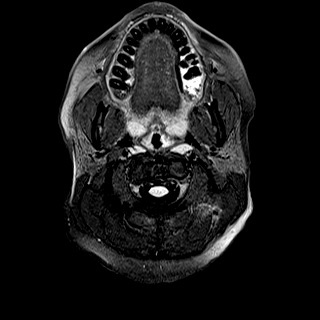
[im 26/26]
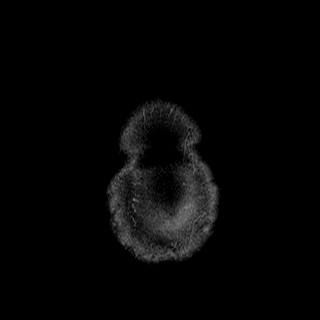

[Series 13: swi_images · axial · 1.5mm · 0.94mm/px · z∈[-70,+83]mm · 8 of 104 slices shown]
[im 1/104]
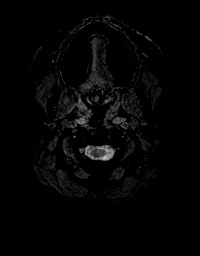
[im 15/104]
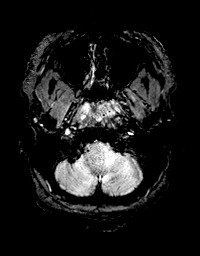
[im 30/104]
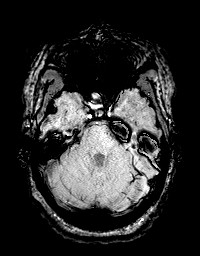
[im 45/104]
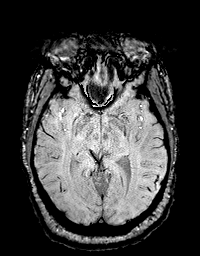
[im 59/104]
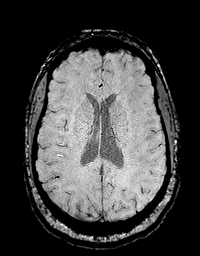
[im 74/104]
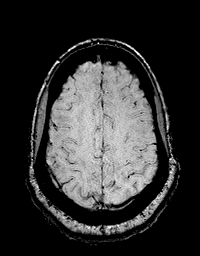
[im 89/104]
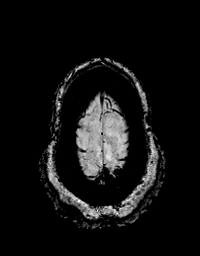
[im 104/104]
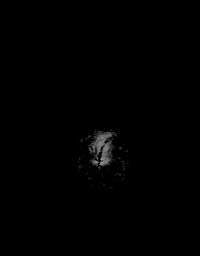

[Series 14: T1 · axial · 1.0mm · 0.94mm/px · z∈[-69,+73]mm · 11 of 144 slices shown (2 of 2)]
[im 1/144]
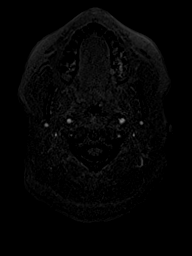
[im 15/144]
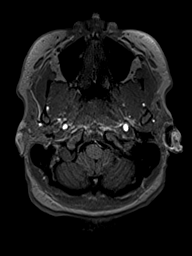
[im 29/144]
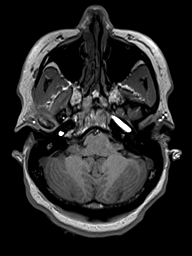
[im 43/144]
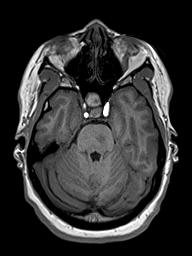
[im 58/144]
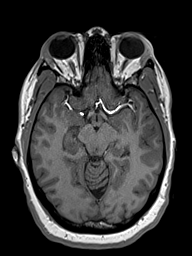
[im 72/144]
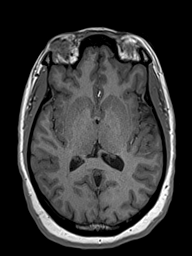
[im 86/144]
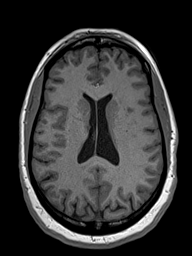
[im 101/144]
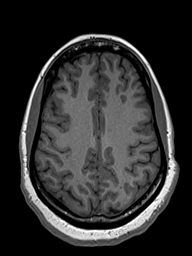
[im 115/144]
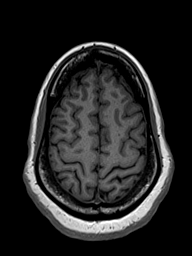
[im 129/144]
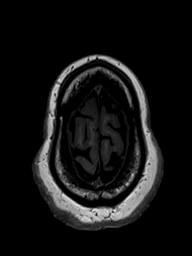
[im 144/144]
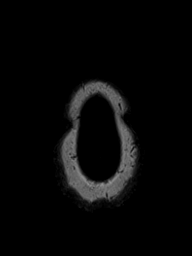

[Series 15: T2 · coronal · 4.5mm · 0.36mm/px · 3 of 33 slices shown (2 of 2)]
[im 1/33]
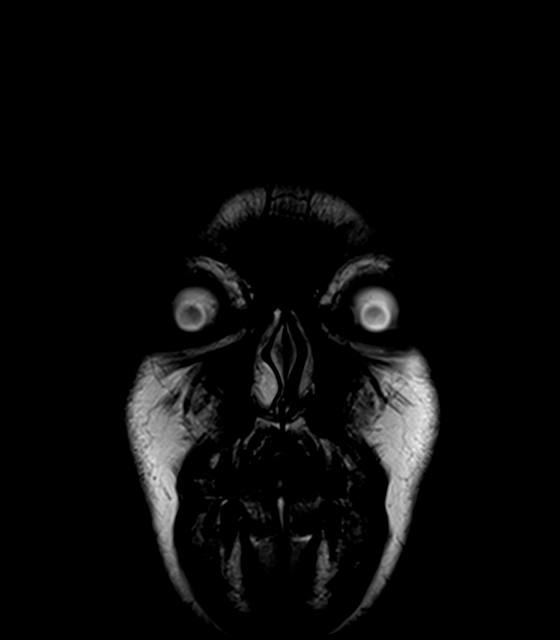
[im 17/33]
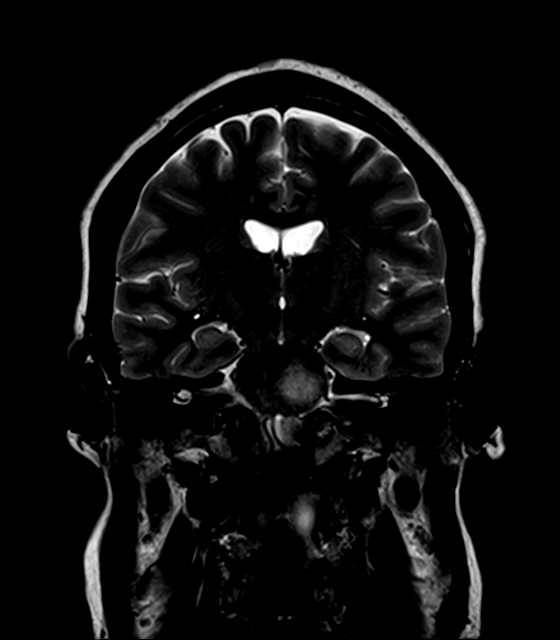
[im 33/33]
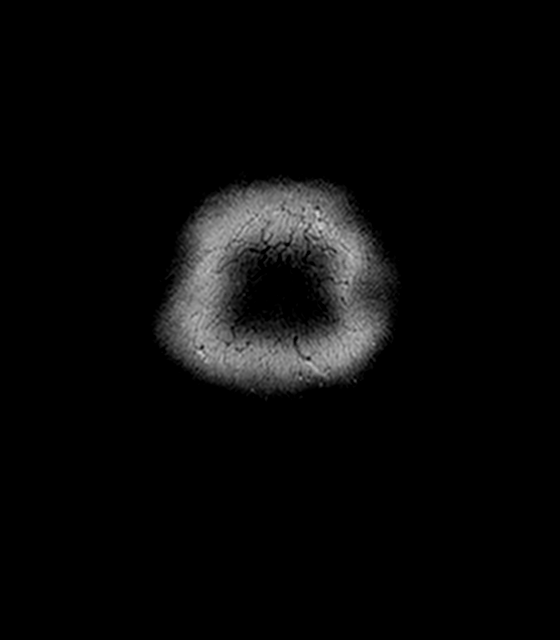

[48 of 48 positions shown; findings below may reference images not displayed]

FINDINGS: Brain: There is a 16 mm T2 bright lesion within the left side of the
pons. 1 cm T2 bright lesion within the white matter adjacent to the
atrium of the left lateral ventricle. 7 mm T2 bright lesion within
the left side of the posterior body of the corpus callosum. These
findings likely represent an acute presentation of multiple
sclerosis. No true restricted diffusion is seen. No hemorrhage,
hydrocephalus or extra-axial collection. Study was ordered without
contrast and done very early in the morning, so we are unable to
obtain an order for contrast administration.

Vascular: Major vessels at the base of the brain show flow.

Skull and upper cervical spine: Normal

Sinuses/Orbits: Retention cyst of the right division of the sphenoid
sinus containing mucoid material. Mucocele not excluded.

Other: None
IMPRESSION: T2 bright lesions of the left side of the pons, the left periatrial
white matter an the left posterior body of the corpus callosum, most
consistent with an acute presentation of multiple sclerosis in a
person of this age. I would suggest postcontrast imaging be
performed subsequently. The differential diagnosis does include
other demyelinating syndromes. I think neoplastic disease is
unlikely given this pattern.

Retention cyst/mucocele of the right sphenoid sinus.

## 2020-05-19 ENCOUNTER — Other Ambulatory Visit: Payer: Self-pay | Admitting: Neurology

## 2020-05-19 DIAGNOSIS — G35 Multiple sclerosis: Secondary | ICD-10-CM

## 2020-06-06 ENCOUNTER — Other Ambulatory Visit: Payer: No Typology Code available for payment source

## 2020-06-12 ENCOUNTER — Ambulatory Visit
Admission: RE | Admit: 2020-06-12 | Discharge: 2020-06-12 | Disposition: A | Discharge status: Home / Self Care | Payer: No Typology Code available for payment source | Source: Ambulatory Visit | Attending: Neurology | Admitting: Neurology

## 2020-06-12 DIAGNOSIS — G35 Multiple sclerosis: Secondary | ICD-10-CM

## 2020-06-12 MED ORDER — GADOBENATE DIMEGLUMINE 529 MG/ML IV SOLN
19.0000 mL | Freq: Once | INTRAVENOUS | Status: AC | PRN
  Administered 2020-06-12: 19 mL via INTRAVENOUS

## 2020-06-15 ENCOUNTER — Telehealth: Payer: Self-pay | Admitting: Neurology

## 2020-06-15 NOTE — Telephone Encounter (Signed)
She reports doing well on Ocrevus and has no new symptoms.  MRI of the brain was unchanged.  EDSS today was 1.0 (1 point for tandem walking be minimally off)  She will have her Ocrevus infusion later this morning.

## 2020-08-01 ENCOUNTER — Encounter: Payer: No Typology Code available for payment source | Admitting: Internal Medicine

## 2020-08-18 ENCOUNTER — Encounter: Payer: Self-pay | Admitting: Internal Medicine

## 2020-08-18 ENCOUNTER — Ambulatory Visit (INDEPENDENT_AMBULATORY_CARE_PROVIDER_SITE_OTHER): Payer: 59 | Admitting: Internal Medicine

## 2020-08-18 ENCOUNTER — Other Ambulatory Visit: Payer: Self-pay

## 2020-08-18 VITALS — BP 132/80 | HR 80 | Temp 98.9°F | Ht 64.0 in | Wt 221.0 lb

## 2020-08-18 DIAGNOSIS — E7800 Pure hypercholesterolemia, unspecified: Secondary | ICD-10-CM | POA: Diagnosis not present

## 2020-08-18 DIAGNOSIS — Z23 Encounter for immunization: Secondary | ICD-10-CM | POA: Diagnosis not present

## 2020-08-18 DIAGNOSIS — E538 Deficiency of other specified B group vitamins: Secondary | ICD-10-CM

## 2020-08-18 DIAGNOSIS — I10 Essential (primary) hypertension: Secondary | ICD-10-CM

## 2020-08-18 DIAGNOSIS — Z0001 Encounter for general adult medical examination with abnormal findings: Secondary | ICD-10-CM

## 2020-08-18 DIAGNOSIS — E559 Vitamin D deficiency, unspecified: Secondary | ICD-10-CM | POA: Diagnosis not present

## 2020-08-18 DIAGNOSIS — R7303 Prediabetes: Secondary | ICD-10-CM | POA: Diagnosis not present

## 2020-08-18 LAB — HEPATIC FUNCTION PANEL
ALT: 32 U/L (ref 0–35)
AST: 26 U/L (ref 0–37)
Albumin: 4.2 g/dL (ref 3.5–5.2)
Alkaline Phosphatase: 100 U/L (ref 39–117)
Bilirubin, Direct: 0 mg/dL (ref 0.0–0.3)
Total Bilirubin: 0.3 mg/dL (ref 0.2–1.2)
Total Protein: 7.4 g/dL (ref 6.0–8.3)

## 2020-08-18 LAB — CBC WITH DIFFERENTIAL/PLATELET
Basophils Absolute: 0.1 10*3/uL (ref 0.0–0.1)
Basophils Relative: 0.8 % (ref 0.0–3.0)
Eosinophils Absolute: 0.1 10*3/uL (ref 0.0–0.7)
Eosinophils Relative: 1.7 % (ref 0.0–5.0)
HCT: 40.7 % (ref 36.0–46.0)
Hemoglobin: 13.9 g/dL (ref 12.0–15.0)
Lymphocytes Relative: 27.1 % (ref 12.0–46.0)
Lymphs Abs: 2.4 10*3/uL (ref 0.7–4.0)
MCHC: 34.2 g/dL (ref 30.0–36.0)
MCV: 90 fl (ref 78.0–100.0)
Monocytes Absolute: 0.6 10*3/uL (ref 0.1–1.0)
Monocytes Relative: 7.2 % (ref 3.0–12.0)
Neutro Abs: 5.6 10*3/uL (ref 1.4–7.7)
Neutrophils Relative %: 63.2 % (ref 43.0–77.0)
Platelets: 362 10*3/uL (ref 150.0–400.0)
RBC: 4.53 Mil/uL (ref 3.87–5.11)
RDW: 13.7 % (ref 11.5–15.5)
WBC: 8.9 10*3/uL (ref 4.0–10.5)

## 2020-08-18 LAB — BASIC METABOLIC PANEL
BUN: 18 mg/dL (ref 6–23)
CO2: 24 mEq/L (ref 19–32)
Calcium: 9.7 mg/dL (ref 8.4–10.5)
Chloride: 98 mEq/L (ref 96–112)
Creatinine, Ser: 0.86 mg/dL (ref 0.40–1.20)
GFR: 84.49 mL/min (ref >60.00)
Glucose, Bld: 152 mg/dL — ABNORMAL HIGH (ref 70–99)
Potassium: 3.8 mEq/L (ref 3.5–5.1)
Sodium: 134 mEq/L — ABNORMAL LOW (ref 135–145)

## 2020-08-18 LAB — LIPID PANEL
Cholesterol: 306 mg/dL — ABNORMAL HIGH (ref 0–200)
HDL: 64.6 mg/dL (ref >39.00)
NonHDL: 241.17
Total CHOL/HDL Ratio: 5
Triglycerides: 293 mg/dL — ABNORMAL HIGH (ref 0.0–149.0)
VLDL: 58.6 mg/dL — ABNORMAL HIGH (ref 0.0–40.0)

## 2020-08-18 LAB — URINALYSIS, ROUTINE W REFLEX MICROSCOPIC
Bilirubin Urine: NEGATIVE
Hgb urine dipstick: NEGATIVE
Ketones, ur: NEGATIVE
Leukocytes,Ua: NEGATIVE
Nitrite: NEGATIVE
RBC / HPF: NONE SEEN (ref 0–?)
Specific Gravity, Urine: 1.015 (ref 1.000–1.030)
Total Protein, Urine: NEGATIVE
Urine Glucose: NEGATIVE
Urobilinogen, UA: 0.2 (ref 0.0–1.0)
pH: 6 (ref 5.0–8.0)

## 2020-08-18 LAB — HEMOGLOBIN A1C: Hgb A1c MFr Bld: 6.6 % — ABNORMAL HIGH (ref 4.6–6.5)

## 2020-08-18 LAB — TSH: TSH: 1.49 u[IU]/mL (ref 0.35–5.50)

## 2020-08-18 LAB — LDL CHOLESTEROL, DIRECT: Direct LDL: 213 mg/dL

## 2020-08-18 LAB — VITAMIN B12: Vitamin B-12: 208 pg/mL — ABNORMAL LOW (ref 211–911)

## 2020-08-18 LAB — VITAMIN D 25 HYDROXY (VIT D DEFICIENCY, FRACTURES): VITD: 17.36 ng/mL — ABNORMAL LOW (ref 30.00–100.00)

## 2020-08-18 NOTE — Progress Notes (Signed)
Patient ID: Victoria Ryan, female   DOB: [birth year], 40 y.o.   MRN: [id number]         Chief Complaint:: wellness exam and hld       HPI:  Victoria Ryan is a 40 y.o. female here for wellness exam; plans to call for GYn appt soon.  , declines covid booster, flu shot, pneumovax o/w up to date with preventive referrals and immunizations                        Also pt working on lower chol diet, not sure she wants to start statin yet given her age.  Pt denies chest pain, increased sob or doe, wheezing, orthopnea, PND, increased LE swelling, palpitations, dizziness or syncope.   Pt denies polydipsia, polyuria, or new focal neuro s/s.   Pt denies fever, wt loss, night sweats, loss of appetite, or other constitutional symptoms  Denies worsening reflux, abd pain, dysphagia, n/v, bowel change or blood.  No other new complaints    Wt Readings from Last 3 Encounters:  08/18/20 221 lb (100.2 kg)  07/30/19 209 lb (94.8 kg)  06/29/19 213 lb (96.6 kg)   BP Readings from Last 3 Encounters:  08/18/20 132/80  07/30/19 140/86  06/29/19 (!) 190/140   Immunization History  Administered Date(s) Administered   Influenza,inj,Quad PF,6+ Mos 12/23/2017   Influenza-Unspecified 11/12/2014, 09/25/2018   PFIZER(Purple Top)SARS-COV-2 Vaccination 04/09/2019, 05/04/2019   PNEUMOCOCCAL CONJUGATE-20 08/18/2020   Tdap 06/13/2012   There are no preventive care reminders to display for this patient.     Past Medical History:  Diagnosis Date   Anxiety    Chicken pox    Gestational diabetes    takes insulin & metformin   Hypertension    take labetalol   Multiple sclerosis (HCC) 12/2017   Status post tubal ligation 02/23/2019   Past Surgical History:  Procedure Laterality Date   CESAREAN SECTION     x2   CESAREAN SECTION WITH BILATERAL TUBAL LIGATION Bilateral 02/23/2019   Procedure: CESAREAN SECTION WITH BILATERAL TUBAL LIGATION;  Franchi: Edwinna Areola, DO;  Location: [facility];  Service:  Obstetrics;  Laterality: Bilateral;    reports that she has quit smoking. Her smoking use included cigars. She has never used smokeless tobacco. She reports current alcohol use. She reports previous drug use. family history includes Breast cancer in her paternal grandmother; Heart disease in her father; Hyperlipidemia in her father; Hypertension in her father and mother. No Known Allergies Current Outpatient Medications on File Prior to Visit  Medication Sig Dispense Refill   amLODipine (NORVASC) 10 MG tablet Take 1 tablet (10 mg total) by mouth daily. 90 tablet 3   hydrochlorothiazide (HYDRODIURIL) 25 MG tablet Take 1 tablet (25 mg total) by mouth daily. 90 tablet 3   olmesartan (BENICAR) 40 MG tablet Take 1 tablet (40 mg total) by mouth daily. 90 tablet 3   potassium chloride (KLOR-CON) 10 MEQ tablet Take 1 tablet (10 mEq total) by mouth daily. 90 tablet 3   metFORMIN (GLUCOPHAGE) 500 MG tablet Take 2 tablets (1,000 mg total) by mouth at bedtime. (Patient not taking: Reported on 08/18/2020) 90 tablet 1   Prenatal Vit-Fe Fumarate-FA (PRENATAL MULTIVITAMIN) TABS tablet Take 1 tablet by mouth daily at 12 noon. (Patient not taking: Reported on 08/18/2020) 100 tablet 3   No current facility-administered medications on file prior to visit.        ROS:  All others reviewed  and negative.  Objective        PE:  BP 132/80 (BP Location: Left Arm, Patient Position: Sitting, Cuff Size: Large)   Pulse 80   Temp 98.9 F (37.2 C) (Oral)   Ht 5\' 4"  (1.626 m)   Wt 221 lb (100.2 kg)   BMI 37.93 kg/m                 Constitutional: Pt appears in NAD               HENT: Head: NCAT.                Right Ear: External ear normal.                 Left Ear: External ear normal.                Eyes: . Pupils are equal, round, and reactive to light. Conjunctivae and EOM are normal               Nose: without d/c or deformity               Neck: Neck supple. Gross normal ROM               Cardiovascular:  Normal rate and regular rhythm.                 Pulmonary/Chest: Effort normal and breath sounds without rales or wheezing.                Abd:  Soft, NT, ND, + BS, no organomegaly               Neurological: Pt is alert. At baseline orientation, motor grossly intact               Skin: Skin is warm. No rashes, no other new lesions, LE edema - none               Psychiatric: Pt behavior is normal without agitation   Micro: none  Cardiac tracings I have personally interpreted today:  none  Pertinent Radiological findings (summarize): none   Lab Results  Component Value Date   WBC 8.9 08/18/2020   HGB 13.9 08/18/2020   HCT 40.7 08/18/2020   PLT 362.0 08/18/2020   GLUCOSE 152 (H) 08/18/2020   CHOL 306 (H) 08/18/2020   TRIG 293.0 (H) 08/18/2020   HDL 64.60 08/18/2020   LDLDIRECT 213.0 08/18/2020   LDLCALC 202 (H) 06/29/2019   ALT 32 08/18/2020   AST 26 08/18/2020   NA 134 (L) 08/18/2020   K 3.8 08/18/2020   CL 98 08/18/2020   CREATININE 0.86 08/18/2020   BUN 18 08/18/2020   CO2 24 08/18/2020   TSH 1.49 08/18/2020   HGBA1C 6.6 (H) 08/18/2020   Assessment/Plan:  10/18/2020 [name] is a 40 y.o. Black or African American [2] female with  has a past medical history of Anxiety, Chicken pox, Gestational diabetes, Hypertension, Multiple sclerosis (HCC) (12/2017), and Status post tubal ligation (02/23/2019).  Vitamin D deficiency Last vitamin D Lab Results  Component Value Date   VD25OH 39.22 07/29/2018   Stable, cont oral replacement   Hyperlipidemia Lab Results  Component Value Date   LDLCALC 202 (H) 06/29/2019   Severe uncontrolled, declines statin, to work on lower chol diet   Encounter for well adult exam with abnormal findings Age and sex appropriate education and counseling updated with regular exercise and diet Referrals for preventative  services - pt to call for GYN appt Immunizations addressed - declines covid booster, flu shot, bur for prevnar 20 today Smoking  counseling  - none needed Evidence for depression or other mood disorder - none significant Most recent labs reviewed. I have personally reviewed and have noted: 1) the patient's medical and social history 2) The patient's current medications and supplements 3) The patient's height, weight, and BMI have been recorded in the chart   Pre-diabetes Lab Results  Component Value Date   HGBA1C 6.6 (H) 08/18/2020   Stable, pt to continue current medical treatment metformin   Essential hypertension, benign BP Readings from Last 3 Encounters:  08/18/20 132/80  07/30/19 140/86  06/29/19 (!) 190/140   Stable, pt to continue medical treatment benicar  Followup: Return in about 1 year (around 08/18/2021).  Oliver Barre, MD 08/20/2020 10:17 PM [facility]

## 2020-08-18 NOTE — Assessment & Plan Note (Addendum)
Lab Results  Component Value Date   LDLCALC 202 (H) 06/29/2019   Severe uncontrolled, declines statin, to work on lower chol diet

## 2020-08-18 NOTE — Patient Instructions (Addendum)
You had the Prevnar 20 shot today  Please continue all other medications as before, and refills have been done if requested.  Please have the pharmacy call with any other refills you may need.  Please continue your efforts at being more active, low cholesterol diet, and weight control.  You are otherwise up to date with prevention measures today.  Please keep your appointments with your specialists as you may have planned  Please go to the LAB at the blood drawing area for the tests to be done  You will be contacted by phone if any changes need to be made immediately.  Otherwise, you will receive a letter about your results with an explanation, but please check with MyChart first.  Please remember to sign up for MyChart if you have not done so, as this will be important to you in the future with finding out test results, communicating by private email, and scheduling acute appointments online when needed.  Please make an Appointment to return for your 1 year visit, or sooner if needed

## 2020-08-18 NOTE — Assessment & Plan Note (Signed)
Last vitamin D Lab Results  Component Value Date   VD25OH 39.22 07/29/2018   Stable, cont oral replacement

## 2020-08-20 ENCOUNTER — Encounter: Payer: Self-pay | Admitting: Internal Medicine

## 2020-08-20 NOTE — Assessment & Plan Note (Addendum)
Age and sex appropriate education and counseling updated with regular exercise and diet Referrals for preventative services - pt to call for GYN appt Immunizations addressed - declines covid booster, flu shot, bur for prevnar 20 today Smoking counseling  - none needed Evidence for depression or other mood disorder - none significant Most recent labs reviewed. I have personally reviewed and have noted: 1) the patient's medical and social history 2) The patient's current medications and supplements 3) The patient's height, weight, and BMI have been recorded in the chart

## 2020-08-20 NOTE — Assessment & Plan Note (Signed)
Lab Results  Component Value Date   HGBA1C 6.6 (H) 08/18/2020   Stable, pt to continue current medical treatment metformin

## 2020-08-20 NOTE — Assessment & Plan Note (Signed)
BP Readings from Last 3 Encounters:  08/18/20 132/80  07/30/19 140/86  06/29/19 (!) 190/140   Stable, pt to continue medical treatment benicar

## 2020-08-21 ENCOUNTER — Encounter: Payer: Self-pay | Admitting: Internal Medicine

## 2020-08-24 ENCOUNTER — Telehealth: Payer: Self-pay | Admitting: Neurology

## 2020-08-24 ENCOUNTER — Encounter: Payer: Self-pay | Admitting: *Deleted

## 2020-08-24 NOTE — Telephone Encounter (Signed)
Called pt back. Advised Dr. Epimenio Foot ok'd writing letter for her for the rest of this week and next week. She would like letter to be made available to mychart. I did this.   She also is requesting FMLA be filled out. I transferred her to Candi Leash to collect fee/get forms.

## 2020-08-24 NOTE — Telephone Encounter (Signed)
Pt called, have had COVID since Monday having body aches in arms and legs. Has affected my MS. Could you write a letter taking me out work due to my MS. Would like a call from the nurse

## 2020-08-26 MED ORDER — ROSUVASTATIN CALCIUM 20 MG PO TABS: 20.0000 mg | ORAL_TABLET | Freq: Every day | ORAL | 3 refills | Status: DC

## 2020-08-29 ENCOUNTER — Telehealth: Payer: Self-pay | Admitting: Neurology

## 2020-08-29 ENCOUNTER — Encounter: Payer: Self-pay | Admitting: *Deleted

## 2020-08-29 MED ORDER — ETODOLAC 400 MG PO TABS: 400.0000 mg | ORAL_TABLET | Freq: Two times a day (BID) | ORAL | 1 refills | Status: DC

## 2020-08-29 NOTE — Telephone Encounter (Signed)
Called and LVM for pt letting her know I spoke w/ Dr. Epimenio Foot who agreed to write her out of work for the next couple weeks as well. We are also going to call in medication: etodolac 400mg  po BID #60, 1 refill for her to take the next couple months. Advised I will release updated letter for her wrk to her mychart. Advised her to call back in 2 weeks if sx have not improved.

## 2020-08-29 NOTE — Telephone Encounter (Signed)
Pt is asking for a call to discuss being taken out of work until the end of the month due to pain she is in.

## 2020-08-29 NOTE — Telephone Encounter (Signed)
Called pt back. Reports increased pain in legs/arms that has been constant since dx w/ covid. Experiencing muscle stiffness. Felt like how she felt when first dx. No meds rx'd for covid. Has been managing w/ OTC meds: sinus PE, tylenol. Covid-sx still present: cold like sx, headache (intermittent). Taste/smell present now, initially lost it. Advised I will discuss w/ MD and call back.  Dr. Epimenio Foot wrote her out initially last week and this week from work.

## 2020-08-31 ENCOUNTER — Telehealth: Payer: Self-pay | Admitting: *Deleted

## 2020-08-31 NOTE — Telephone Encounter (Signed)
Gave completed/signed STD form from [employer] back to medical records to process for pt.

## 2020-09-01 ENCOUNTER — Telehealth: Payer: Self-pay | Admitting: *Deleted

## 2020-09-01 NOTE — Telephone Encounter (Signed)
Pt [employer] form faxed on 08/31/20 [phone]

## 2020-09-05 ENCOUNTER — Telehealth: Payer: Self-pay | Admitting: *Deleted

## 2020-09-05 NOTE — Telephone Encounter (Signed)
I re faxed pt [employer] form on 09/05/20.

## 2020-09-13 ENCOUNTER — Telehealth: Payer: Self-pay | Admitting: *Deleted

## 2020-09-13 NOTE — Telephone Encounter (Signed)
Pt short-term disability [employer] need a letter explaining how covid -19 affect her ms symptons and how it cause her to have a flare up. Dates from  08/24/20 09/14/20.

## 2020-09-14 ENCOUNTER — Encounter: Payer: Self-pay | Admitting: Neurology

## 2020-09-14 NOTE — Telephone Encounter (Signed)
Sent a mychart to the patient, it seems 04/15/2019 was her last ov documented. Pt needs to come in for apt. Blocked two slots on sept 6 to offer and will wait to her from the patient

## 2020-09-20 ENCOUNTER — Ambulatory Visit (INDEPENDENT_AMBULATORY_CARE_PROVIDER_SITE_OTHER): Payer: 59 | Admitting: Neurology

## 2020-09-20 ENCOUNTER — Other Ambulatory Visit: Payer: Self-pay

## 2020-09-20 ENCOUNTER — Encounter: Payer: Self-pay | Admitting: Neurology

## 2020-09-20 VITALS — BP 157/95 | HR 111 | Ht 64.0 in | Wt 218.0 lb

## 2020-09-20 DIAGNOSIS — R260 Ataxic gait: Secondary | ICD-10-CM

## 2020-09-20 DIAGNOSIS — G35 Multiple sclerosis: Secondary | ICD-10-CM

## 2020-09-20 DIAGNOSIS — R5383 Other fatigue: Secondary | ICD-10-CM | POA: Diagnosis not present

## 2020-09-20 DIAGNOSIS — R200 Anesthesia of skin: Secondary | ICD-10-CM

## 2020-09-20 DIAGNOSIS — G5601 Carpal tunnel syndrome, right upper limb: Secondary | ICD-10-CM | POA: Insufficient documentation

## 2020-09-20 DIAGNOSIS — Z79899 Other long term (current) drug therapy: Secondary | ICD-10-CM

## 2020-09-20 NOTE — Telephone Encounter (Signed)
Gave completed/signed FMLA back to medical records to process for pt. 

## 2020-09-20 NOTE — Progress Notes (Signed)
[facility]  PATIENT: Victoria Ryan DOB: 26-Jan-1980  REFERRING DOCTOR OR PCP: Oliver Barre, MD  _________________________________   HISTORICAL  CHIEF COMPLAINT:  Chief Complaint  Patient presents with   Follow-up    Rm 1,alone. Here for yearly MS f/u, pt had COVID in early Aug. Pt continues to have pn in arms and legs. Has been working from home, pn has gotten a little better since.     HISTORY OF PRESENT ILLNESS:  Victoria Ryan is a 40 y.o. 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  Update 09/20/2020 She is on Ocrevus.  She denies exacerbations though she notes some more siffness in her arms and les legs she gets myalgias in the right arm and legs.      She notes some painful numbness in the right hand at times and sometimes wakes up with this.   Shaking the hand/wrist can help.     She feels gait is not baseline but she could walk a mile wthout a pause in about 20 minutes.   She has some right hand weakness and also notes her legs fatigue easily and sometimes feel weak.    She denies actual weakness in the legs.     Bladder function is fine.   Vsion is unchanged.   She has fatigue.   She sleeps poorly some nights more diffciulty with sleep maintenance.     MS HIstory: She inoted numbness on the right laeg and arm Monday 12/24/2017.   She was under a lot of stress and noted anxiety.   She saw her PCP and was told that symptoms would likely improve after af few days off.     She also noted her voice was mildly slurred and the left face was num (also milder right face).   She did not note diplopia.   She had no problems with eating or swallowing.   She noted that her handwriting and typing were off.     When symptoms persisted she had a brain MRI 12/30/2017.  I reviewed the report and personally reviewed the images.  The brain MRI showed a large focus in the left pons and a smaller subcortical focus in the left parietal lobe.  Additionally there  appeared to be a small periventricular focus on the left.  She had a couple of more nonspecific appearing T2/flair hyperintense foci in the subcortical or deep white matter of the hemispheres.  She also noted some difficulty with her right vision.   She has mildly reduced acuity and altered color vision out of that eye.    She started Vumerity but stopped in 2020 for pregnancy.   After the pregnancy she started Ocrevus.    IMAGING: MRI Brain 01/26/2018 showed large left pontine focus previously seen on MRI from 12/30/2017 is noted to have incomplete peripheral enhancement on this contrasted study.  This is consistent with a subacute demyelinating focus.   This pattern combined with the 6 other foci observed in the supratentorial and left thalamic white matter is consistent with multiple sclerosis  MRI cervical and thoracic spine 01/26/2018 and 01/29/2018 showed normal spinal cord  MRI Brain 06/12/2020 showed multiple T2/FLAIR hyperintense foci in the brainstem, right middle cerebellar peduncle, thalamus and hemispheres consistent with chronic demyelinating plaque associated with multiple sclerosis.  None of the foci enhance.  Compared to the MRI dated 12/17/2019, there are no new lesions  REVIEW OF SYSTEMS: Constitutional: No fevers, chills, sweats, or change in appetite.  She  has insomnia. Eyes: No visual changes, double vision, eye pain Ear, nose and throat: No hearing loss, ear pain, nasal congestion, sore throat Cardiovascular: No chest pain, palpitations Respiratory:  No shortness of breath at rest or with exertion.   No wheezes GastrointestinaI: No nausea, vomiting, diarrhea, abdominal pain, fecal incontinence Genitourinary: Urinary frequency and urgency with occasional urge incontinence.   Musculoskeletal:  No neck pain, back pain Integumentary: No rash, pruritus, skin lesions Neurological: as above Psychiatric: No depression at this time.  No anxiety Endocrine: No palpitations, diaphoresis,  change in appetite, change in weigh or increased thirst Hematologic/Lymphatic:  No anemia, purpura, petechiae. Allergic/Immunologic: No itchy/runny eyes, nasal congestion, recent allergic reactions, rashes  ALLERGIES: No Known Allergies  HOME MEDICATIONS:  Current Outpatient Medications:    amLODipine (NORVASC) 10 MG tablet, Take 1 tablet (10 mg total) by mouth daily., Disp: 90 tablet, Rfl: 3   etodolac (LODINE) 400 MG tablet, Take 1 tablet (400 mg total) by mouth 2 (two) times daily., Disp: 60 tablet, Rfl: 1   hydrochlorothiazide (HYDRODIURIL) 25 MG tablet, Take 1 tablet (25 mg total) by mouth daily., Disp: 90 tablet, Rfl: 3   metFORMIN (GLUCOPHAGE) 500 MG tablet, Take 2 tablets (1,000 mg total) by mouth at bedtime., Disp: 90 tablet, Rfl: 1   olmesartan (BENICAR) 40 MG tablet, Take 1 tablet (40 mg total) by mouth daily., Disp: 90 tablet, Rfl: 3   potassium chloride (KLOR-CON) 10 MEQ tablet, Take 1 tablet (10 mEq total) by mouth daily., Disp: 90 tablet, Rfl: 3   Prenatal Vit-Fe Fumarate-FA (PRENATAL MULTIVITAMIN) TABS tablet, Take 1 tablet by mouth daily at 12 noon., Disp: 100 tablet, Rfl: 3   rosuvastatin (CRESTOR) 20 MG tablet, Take 1 tablet (20 mg total) by mouth daily., Disp: 90 tablet, Rfl: 3  PAST MEDICAL HISTORY: Past Medical History:  Diagnosis Date   Anxiety    Chicken pox    Gestational diabetes    takes insulin & metformin   Hypertension    take labetalol   Multiple sclerosis (HCC) 12/2017   Status post tubal ligation 02/23/2019    PAST SURGICAL HISTORY: Past Surgical History:  Procedure Laterality Date   CESAREAN SECTION     x2   CESAREAN SECTION WITH BILATERAL TUBAL LIGATION Bilateral 02/23/2019   Procedure: CESAREAN SECTION WITH BILATERAL TUBAL LIGATION;  Knierim: Edwinna Areola, DO;  Location: [facility] LD [facility]S;  Service: Obstetrics;  Laterality: Bilateral;    FAMILY HISTORY: Family History  Problem Relation Age of Onset   Hypertension Mother    Heart disease  Father    Hyperlipidemia Father    Hypertension Father    Breast cancer Paternal Grandmother    Cancer Neg Hx     SOCIAL HISTORY:  Social History   Socioeconomic History   Marital status: Married    Spouse name: [name]   Number of children: 2   Years of education: 14   Highest education level: Not on file  Occupational History   Occupation: Clinical biochemist Rep  Tobacco Use   Smoking status: Former    Types: Cigars   Smokeless tobacco: Never  Vaping Use   Vaping Use: Never used  Substance and Sexual Activity   Alcohol use: Yes    Comment: occasionally wine   Drug use: Not Currently   Sexual activity: Yes    Birth control/protection: None  Other Topics Concern   Not on file  Social History Narrative   Lives w/ husband and kids   Regular exercise-yes  Caffeine Use-yes   Right handed    Social Determinants of Health   Financial Resource Strain: Not on file  Food Insecurity: Not on file  Transportation Needs: Not on file  Physical Activity: Not on file  Stress: Not on file  Social Connections: Not on file  Intimate Partner Violence: Not on file     PHYSICAL EXAM  Vitals:   09/20/20 1135  BP: (!) 157/95  Pulse: (!) 111  Weight: 218 lb (98.9 kg)  Height: 5\' 4"  (1.626 m)    Body mass index is 37.42 kg/m.   General: The patient is well-developed and well-nourished and in no acute distress   Neurologic Exam  Mental status: The patient is alert and oriented x 3 at the time of the examination. The patient has apparent normal recent and remote memory, with an apparently normal attention span and concentration ability.   Speech is normal.  Cranial nerves: Extraocular movements are full.    Facial strength is normal.  Trapezius strength is normal. Hearing appears normal. .  Motor: Normal muscle tone and bulk.  Normal strength. Except 4+/5 right APB.  Other: Tinels sign at right wrist   Sensory: She has intact sensation to touch, temperature  and vibration.  Coordination: Cerebellar shows good finger-nose-finger and heel-to-shin  Gait and station: Station is normal.   Gait is normal. Tandem gait is wide.  Romberg is negative.  Reflexes: Deep tendon reflexes are symmetric and normal bilaterally.      DIAGNOSTIC DATA (LABS, IMAGING, TESTING) - I reviewed patient records, labs, notes, testing and imaging myself where available.  Lab Results  Component Value Date   WBC 8.9 08/18/2020   HGB 13.9 08/18/2020   HCT 40.7 08/18/2020   MCV 90.0 08/18/2020   PLT 362.0 08/18/2020      Component Value Date/Time   NA 134 (L) 08/18/2020 1006   K 3.8 08/18/2020 1006   CL 98 08/18/2020 1006   CO2 24 08/18/2020 1006   GLUCOSE 152 (H) 08/18/2020 1006   BUN 18 08/18/2020 1006   CREATININE 0.86 08/18/2020 1006   CALCIUM 9.7 08/18/2020 1006   PROT 7.4 08/18/2020 1006   PROT 7.3 02/06/2018 1207   ALBUMIN 4.2 08/18/2020 1006   ALBUMIN 4.7 02/06/2018 1207   AST 26 08/18/2020 1006   ALT 32 08/18/2020 1006   ALKPHOS 100 08/18/2020 1006   BILITOT 0.3 08/18/2020 1006   BILITOT 0.3 02/06/2018 1207   GFRNONAA >60 02/19/2019 1428   GFRAA >60 02/19/2019 1428   Lab Results  Component Value Date   CHOL 306 (H) 08/18/2020   HDL 64.60 08/18/2020   LDLCALC 202 (H) 06/29/2019   LDLDIRECT 213.0 08/18/2020   TRIG 293.0 (H) 08/18/2020   CHOLHDL 5 08/18/2020   Lab Results  Component Value Date   HGBA1C 6.6 (H) 08/18/2020   Lab Results  Component Value Date   VITAMINB12 208 (L) 08/18/2020   Lab Results  Component Value Date   TSH 1.49 08/18/2020       ASSESSMENT AND PLAN    1. Multiple sclerosis (HCC)   2. Ataxic gait   3. Numbness   4. Other fatigue   5. High risk medication use   6. Carpal tunnel syndrome of right wrist      1.   Continue Ocrevus through [place] study. 2.   CTS wrist splint   If not better check NCV/EMG 3.   FMLA paperwork 4.  .Return for study/infusion visits.     Gurman Ashland  Aida Puffer, MD, PhD,  FAAN Certified in Neurology, Clinical Neurophysiology, Sleep Medicine, Pain Medicine and Neuroimaging Director, [facility] at [facility]  [facility] [address] [place], [place] [address] [phone]

## 2020-09-26 ENCOUNTER — Telehealth: Payer: Self-pay | Admitting: Neurology

## 2020-09-26 NOTE — Telephone Encounter (Signed)
Pt called wanting to discuss her FMLA paperwork with the RN she spoke to this morning. Please advise.

## 2020-09-26 NOTE — Telephone Encounter (Signed)
I called the patient. She needs the most recent FMLA paperwork changed to reflect her continuous time away from work. She returned to her job on 09/22/20. She will be sending separate FMLA forms to complete for intermittent time off.  The corrections have been made. Faxed and confirmed to [employer] at [phone].

## 2020-10-04 ENCOUNTER — Encounter: Payer: Self-pay | Admitting: Internal Medicine

## 2020-10-04 ENCOUNTER — Telehealth: Payer: Self-pay | Admitting: Internal Medicine

## 2020-10-04 NOTE — Telephone Encounter (Signed)
1.Medication Requested: amLODipine (NORVASC) 10 MG tablet olmesartan (BENICAR) 40 MG tablet hydrochlorothiazide (HYDRODIURIL) 25 MG tablet potassium chloride (KLOR-CON) 10 MEQ tablet   2. Pharmacy (Name, Street, [place]): [id number] - [place] - [address]  Phone:  [phone] Fax:  [phone]   3. On Med List: yes  4. Last Visit with PCP: 08.04.22  5. Next visit date with PCP: 08.09.23   Agent: Please be advised that RX refills may take up to 3 business days. We ask that you follow-up with your pharmacy.

## 2020-10-04 NOTE — Telephone Encounter (Signed)
Please refill as per office routine med refill policy (all routine meds to be refilled for 3 mo or monthly (per pt preference) up to one year from last visit, then month to month grace period for 3 mo, then further med refills will have to be denied) ? ?

## 2020-10-05 MED ORDER — POTASSIUM CHLORIDE ER 10 MEQ PO TBCR: 10.0000 meq | EXTENDED_RELEASE_TABLET | Freq: Every day | ORAL | 3 refills | Status: DC

## 2020-10-05 MED ORDER — AMLODIPINE BESYLATE 10 MG PO TABS: 10.0000 mg | ORAL_TABLET | Freq: Every day | ORAL | 3 refills | Status: DC

## 2020-10-05 MED ORDER — OLMESARTAN MEDOXOMIL 40 MG PO TABS: 40.0000 mg | ORAL_TABLET | Freq: Every day | ORAL | 3 refills | Status: DC

## 2020-10-05 MED ORDER — HYDROCHLOROTHIAZIDE 25 MG PO TABS: 25.0000 mg | ORAL_TABLET | Freq: Every day | ORAL | 3 refills | Status: DC

## 2020-10-05 NOTE — Telephone Encounter (Signed)
duplicate

## 2020-11-15 ENCOUNTER — Encounter: Payer: Self-pay | Admitting: Neurology

## 2020-11-24 ENCOUNTER — Telehealth: Payer: Self-pay | Admitting: Neurology

## 2020-11-24 NOTE — Telephone Encounter (Signed)
She is here today for a research visit.  She reports no difficulties with the Ocrevus infusions.  She reports no new neurologic symptoms.  Currently, gait, strength, sensation, cognitive function and bowel/bladder are doing well.  She scored 1.0 on the EDSS (cerebellar score was 1.0 due to mild tandem gait)  She had her infusion today and had no difficulties.  Research study test, scales were performed today.

## 2020-11-29 ENCOUNTER — Other Ambulatory Visit: Payer: Self-pay | Admitting: Neurology

## 2020-11-29 DIAGNOSIS — G35 Multiple sclerosis: Secondary | ICD-10-CM

## 2021-05-12 ENCOUNTER — Ambulatory Visit
Admission: RE | Admit: 2021-05-12 | Discharge: 2021-05-12 | Disposition: A | Discharge status: Home / Self Care | Payer: Self-pay | Source: Ambulatory Visit | Attending: Neurology | Admitting: Neurology

## 2021-05-12 DIAGNOSIS — G35 Multiple sclerosis: Secondary | ICD-10-CM

## 2021-05-12 MED ORDER — GADOTERIDOL 279.3 MG/ML IV SOLN
18.0000 mL | Freq: Once | INTRAVENOUS | Status: AC | PRN
  Administered 2021-05-12: 18 mL via INTRAVENOUS

## 2021-06-20 ENCOUNTER — Telehealth: Payer: Self-pay

## 2021-06-20 NOTE — Telephone Encounter (Signed)
Called patient to inform her that the FMLA paperwork was updated and fax

## 2021-06-20 NOTE — Telephone Encounter (Signed)
Victoria Ryan is the spouse of [name] and FMLA pw was filled out on her behalf. Pt stated that her job is requiring section 7A and 7B to be corrected. 7-A should say continuous  7-B should say 40 hours/ week.  Please contact Kerissa with any question and upon completion. CB  [phone]  Please advise

## 2021-08-12 ENCOUNTER — Other Ambulatory Visit: Payer: Self-pay | Admitting: Neurology

## 2021-08-12 MED ORDER — OCREVUS 300 MG/10ML IV SOLN: 600.0000 mg | INTRAVENOUS | 5 refills | Status: AC

## 2021-08-23 ENCOUNTER — Ambulatory Visit (INDEPENDENT_AMBULATORY_CARE_PROVIDER_SITE_OTHER): Payer: BC Managed Care – PPO | Admitting: Internal Medicine

## 2021-08-23 ENCOUNTER — Encounter: Payer: Self-pay | Admitting: Internal Medicine

## 2021-08-23 VITALS — BP 138/80 | HR 105 | Temp 99.1°F | Ht 64.0 in | Wt 198.0 lb

## 2021-08-23 DIAGNOSIS — E538 Deficiency of other specified B group vitamins: Secondary | ICD-10-CM | POA: Insufficient documentation

## 2021-08-23 DIAGNOSIS — I10 Essential (primary) hypertension: Secondary | ICD-10-CM

## 2021-08-23 DIAGNOSIS — E1165 Type 2 diabetes mellitus with hyperglycemia: Secondary | ICD-10-CM | POA: Diagnosis not present

## 2021-08-23 DIAGNOSIS — E559 Vitamin D deficiency, unspecified: Secondary | ICD-10-CM | POA: Diagnosis not present

## 2021-08-23 DIAGNOSIS — E7800 Pure hypercholesterolemia, unspecified: Secondary | ICD-10-CM | POA: Diagnosis not present

## 2021-08-23 DIAGNOSIS — Z0000 Encounter for general adult medical examination without abnormal findings: Secondary | ICD-10-CM | POA: Diagnosis not present

## 2021-08-23 DIAGNOSIS — L989 Disorder of the skin and subcutaneous tissue, unspecified: Secondary | ICD-10-CM

## 2021-08-23 DIAGNOSIS — R7303 Prediabetes: Secondary | ICD-10-CM

## 2021-08-23 DIAGNOSIS — Z0001 Encounter for general adult medical examination with abnormal findings: Secondary | ICD-10-CM

## 2021-08-23 LAB — BASIC METABOLIC PANEL
BUN: 19 mg/dL (ref 6–23)
CO2: 25 mEq/L (ref 19–32)
Calcium: 9.8 mg/dL (ref 8.4–10.5)
Chloride: 97 mEq/L (ref 96–112)
Creatinine, Ser: 0.81 mg/dL (ref 0.40–1.20)
GFR: 90.15 mL/min (ref >60.00)
Glucose, Bld: 364 mg/dL — ABNORMAL HIGH (ref 70–99)
Potassium: 3.9 mEq/L (ref 3.5–5.1)
Sodium: 131 mEq/L — ABNORMAL LOW (ref 135–145)

## 2021-08-23 LAB — LIPID PANEL
Cholesterol: 171 mg/dL (ref 0–200)
HDL: 47.9 mg/dL (ref >39.00)
NonHDL: 123.1
Total CHOL/HDL Ratio: 4
Triglycerides: 239 mg/dL — ABNORMAL HIGH (ref 0.0–149.0)
VLDL: 47.8 mg/dL — ABNORMAL HIGH (ref 0.0–40.0)

## 2021-08-23 LAB — CBC WITH DIFFERENTIAL/PLATELET
Basophils Absolute: 0 10*3/uL (ref 0.0–0.1)
Basophils Relative: 0.5 % (ref 0.0–3.0)
Eosinophils Absolute: 0 10*3/uL (ref 0.0–0.7)
Eosinophils Relative: 0.5 % (ref 0.0–5.0)
HCT: 41.8 % (ref 36.0–46.0)
Hemoglobin: 14.4 g/dL (ref 12.0–15.0)
Lymphocytes Relative: 27.3 % (ref 12.0–46.0)
Lymphs Abs: 1.9 10*3/uL (ref 0.7–4.0)
MCHC: 34.5 g/dL (ref 30.0–36.0)
MCV: 88.5 fl (ref 78.0–100.0)
Monocytes Absolute: 0.7 10*3/uL (ref 0.1–1.0)
Monocytes Relative: 9.5 % (ref 3.0–12.0)
Neutro Abs: 4.3 10*3/uL (ref 1.4–7.7)
Neutrophils Relative %: 62.2 % (ref 43.0–77.0)
Platelets: 301 10*3/uL (ref 150.0–400.0)
RBC: 4.73 Mil/uL (ref 3.87–5.11)
RDW: 13.5 % (ref 11.5–15.5)
WBC: 7 10*3/uL (ref 4.0–10.5)

## 2021-08-23 LAB — HEPATIC FUNCTION PANEL
ALT: 22 U/L (ref 0–35)
AST: 14 U/L (ref 0–37)
Albumin: 4.6 g/dL (ref 3.5–5.2)
Alkaline Phosphatase: 124 U/L — ABNORMAL HIGH (ref 39–117)
Bilirubin, Direct: 0.1 mg/dL (ref 0.0–0.3)
Total Bilirubin: 0.4 mg/dL (ref 0.2–1.2)
Total Protein: 7.8 g/dL (ref 6.0–8.3)

## 2021-08-23 LAB — VITAMIN B12: Vitamin B-12: 1343 pg/mL — ABNORMAL HIGH (ref 211–911)

## 2021-08-23 LAB — LDL CHOLESTEROL, DIRECT: Direct LDL: 94 mg/dL

## 2021-08-23 LAB — URINALYSIS, ROUTINE W REFLEX MICROSCOPIC
Bilirubin Urine: NEGATIVE
Ketones, ur: NEGATIVE
Leukocytes,Ua: NEGATIVE
Nitrite: NEGATIVE
Specific Gravity, Urine: 1.01 (ref 1.000–1.030)
Total Protein, Urine: 30 — AB
Urine Glucose: >=1000 — AB
Urobilinogen, UA: 0.2 (ref 0.0–1.0)
pH: 5.5 (ref 5.0–8.0)

## 2021-08-23 LAB — TSH: TSH: 1.55 u[IU]/mL (ref 0.35–5.50)

## 2021-08-23 LAB — VITAMIN D 25 HYDROXY (VIT D DEFICIENCY, FRACTURES): VITD: 28.79 ng/mL — ABNORMAL LOW (ref 30.00–100.00)

## 2021-08-23 LAB — HEMOGLOBIN A1C: Hgb A1c MFr Bld: 13.3 % — ABNORMAL HIGH (ref 4.6–6.5)

## 2021-08-23 NOTE — Assessment & Plan Note (Signed)
Last vitamin D Lab Results  Component Value Date   VD25OH 17.36 (L) 08/18/2020   Low, to start oral replacement

## 2021-08-23 NOTE — Assessment & Plan Note (Signed)
Lab Results  Component Value Date   VITAMINB12 208 (L) 08/18/2020   Low, to start oral replacement - b12 1000 mcg qd

## 2021-08-23 NOTE — Patient Instructions (Signed)
Please continue all other medications as before, and refills have been done if requested.  Please have the pharmacy call with any other refills you may need.  Please continue your efforts at being more active, low cholesterol diet, and weight control.  You are otherwise up to date with prevention measures today.  Please keep your appointments with your specialists as you may have planned  You will be contacted regarding the referral for: Dermatology  Please go to the LAB at the blood drawing area for the tests to be done  You will be contacted by phone if any changes need to be made immediately.  Otherwise, you will receive a letter about your results with an explanation, but please check with MyChart first.  Please remember to sign up for MyChart if you have not done so, as this will be important to you in the future with finding out test results, communicating by private email, and scheduling acute appointments online when needed.  Please make an Appointment to return for your 1 year visit, or sooner if needed

## 2021-08-23 NOTE — Progress Notes (Signed)
Patient ID: Victoria Ryan, female   DOB: [birth year], 41 y.o.   MRN: [id number]         Chief Complaint:: wellness exam and low b12, low Vit D, forehead skin lesion, htn, hld, hyperglycemia       HPI:  Victoria Ryan is a 41 y.o. female here for wellness exam; pt will call for pap smear, o/w up to date                        Also Pt denies chest pain, increased sob or doe, wheezing, orthopnea, PND, increased LE swelling, palpitations, dizziness or syncope.   Pt denies polydipsia, polyuria, or new focal neuro s/s.    Pt denies fever, night sweats, loss of appetite, or other constitutional symptoms  Lost aprox 20 lbs with better diet and acitvity.  Does have a patch or dark skin quite large to forehead x 2 mo non resolving and asks for derm referral.  Not taking Vit D or B12. Wt Readings from Last 3 Encounters:  08/23/21 198 lb (89.8 kg)  09/20/20 218 lb (98.9 kg)  08/18/20 221 lb (100.2 kg)   BP Readings from Last 3 Encounters:  08/23/21 138/80  09/20/20 (!) 157/95  08/18/20 132/80   Immunization History  Administered Date(s) Administered   Influenza,inj,Quad PF,6+ Mos 12/23/2017   Influenza-Unspecified 11/12/2014, 09/25/2018   PFIZER(Purple Top)SARS-COV-2 Vaccination 04/09/2019, 05/04/2019   PNEUMOCOCCAL CONJUGATE-20 08/18/2020   Tdap 06/13/2012   Health Maintenance Due  Topic Date Due   FOOT EXAM  Never done   OPHTHALMOLOGY EXAM  Never done   Diabetic kidney evaluation - Urine ACR  02/19/2020      Past Medical History:  Diagnosis Date   Anxiety    Chicken pox    Gestational diabetes    takes insulin & metformin   Hypertension    take labetalol   Multiple sclerosis (HCC) 12/2017   Status post tubal ligation 02/23/2019   Past Surgical History:  Procedure Laterality Date   CESAREAN SECTION     x2   CESAREAN SECTION WITH BILATERAL TUBAL LIGATION Bilateral 02/23/2019   Procedure: CESAREAN SECTION WITH BILATERAL TUBAL LIGATION;  Proehl: Victoria Areola, DO;   Location: [facility] LD ORS;  Service: Obstetrics;  Laterality: Bilateral;    reports that she has quit smoking. Her smoking use included cigars. She has never used smokeless tobacco. She reports current alcohol use. She reports that she does not currently use drugs. family history includes Breast cancer in her paternal grandmother; Heart disease in her father; Hyperlipidemia in her father; Hypertension in her father and mother. No Known Allergies Current Outpatient Medications on File Prior to Visit  Medication Sig Dispense Refill   amLODipine (NORVASC) 10 MG tablet Take 1 tablet (10 mg total) by mouth daily. 90 tablet 3   etodolac (LODINE) 400 MG tablet Take 1 tablet (400 mg total) by mouth 2 (two) times daily. 60 tablet 1   hydrochlorothiazide (HYDRODIURIL) 25 MG tablet Take 1 tablet (25 mg total) by mouth daily. 90 tablet 3   ocrelizumab (OCREVUS) 300 MG/10ML injection Inject 20 mLs (600 mg total) into the vein every 6 (six) months. 20 mL 5   olmesartan (BENICAR) 40 MG tablet Take 1 tablet (40 mg total) by mouth daily. 90 tablet 3   potassium chloride (KLOR-CON) 10 MEQ tablet Take 1 tablet (10 mEq total) by mouth daily. 90 tablet 3   rosuvastatin (CRESTOR) 20 MG tablet Take 1 tablet (  20 mg total) by mouth daily. 90 tablet 3   No current facility-administered medications on file prior to visit.        ROS:  All others reviewed and negative.  Objective        PE:  BP 138/80 (BP Location: Right Arm, Patient Position: Sitting, Cuff Size: Normal)   Pulse (!) 105   Temp 99.1 F (37.3 C) (Oral)   Ht 5\' 4"  (1.626 m)   Wt 198 lb (89.8 kg)   SpO2 98%   BMI 33.99 kg/m                 Constitutional: Pt appears in NAD               HENT: Head: NCAT.                Right Ear: External ear normal.                 Left Ear: External ear normal.                Eyes: . Pupils are equal, round, and reactive to light. Conjunctivae and EOM are normal               Nose: without d/c or deformity                Neck: Neck supple. Gross normal ROM               Cardiovascular: Normal rate and regular rhythm.                 Pulmonary/Chest: Effort normal and breath sounds without rales or wheezing.                Abd:  Soft, NT, ND, + BS, no organomegaly               Neurological: Pt is alert. At baseline orientation, motor grossly intact               Skin: Skin is warm. Has large right forehead dark patch nontender approx 3  x 3 cm,, LE edema - none               Psychiatric: Pt behavior is normal without agitation   Micro: none  Cardiac tracings I have personally interpreted today:  none  Pertinent Radiological findings (summarize): none   Lab Results  Component Value Date   WBC 7.0 08/23/2021   HGB 14.4 08/23/2021   HCT 41.8 08/23/2021   PLT 301.0 08/23/2021   GLUCOSE 364 (H) 08/23/2021   CHOL 171 08/23/2021   TRIG 239.0 (H) 08/23/2021   HDL 47.90 08/23/2021   LDLDIRECT 94.0 08/23/2021   LDLCALC 202 (H) 06/29/2019   ALT 22 08/23/2021   AST 14 08/23/2021   NA 131 (Victoria) 08/23/2021   K 3.9 08/23/2021   CL 97 08/23/2021   CREATININE 0.81 08/23/2021   BUN 19 08/23/2021   CO2 25 08/23/2021   TSH 1.55 08/23/2021   HGBA1C 13.3 (H) 08/23/2021   Assessment/Plan:  10/23/2021 Victoria Ryan is a 41 y.o. Black or African American [2] female with  has a past medical history of Anxiety, Chicken pox, Gestational diabetes, Hypertension, Multiple sclerosis (HCC) (12/2017), and Status post tubal ligation (02/23/2019).  Vitamin D deficiency Last vitamin D Lab Results  Component Value Date   VD25OH 17.36 (Victoria) 08/18/2020   Low, to start oral replacement   B12 deficiency Lab Results  Component  Value Date   VITAMINB12 208 (Victoria) 08/18/2020   Low, to start oral replacement - b12 1000 mcg qd   Encounter for well adult exam with abnormal findings Age and sex appropriate education and counseling updated with regular exercise and diet Referrals for preventative services - pt to call for pap smear  herself per pt Immunizations addressed - none needed Smoking counseling  - none needed Evidence for depression or other mood disorder - none significant Most recent labs reviewed. I have personally reviewed and have noted: 1) the patient's medical and social history 2) The patient's current medications and supplements 3) The patient's height, weight, and BMI have been recorded in the chart   Essential hypertension, benign BP Readings from Last 3 Encounters:  08/23/21 138/80  09/20/20 (!) 157/95  08/18/20 132/80   Stable, pt to continue medical treatment norvasc 10 mg qd, benicar 40 mg qd, hct 25 mg qd   Hyperlipidemia Lab Results  Component Value Date   LDLCALC 202 (H) 06/29/2019   Severe uncontrolled, goal ldl < 100. pt to restart crestor 20 qd   Diabetes ([place]) Lab Results  Component Value Date   HGBA1C 13.3 (H) 08/23/2021   Severe uncontrolled, pt to continue current medical treatment metformin ER 500 gm - 2 po bid, also add actos 45 qd and mounjaro 2.5 weekly   Skin lesion of face Persistent to forehead, etiology unclear, for derm referral  Followup: No follow-ups on file.  Cathlean Cower, MD 08/26/2021 5:54 PM [facility]

## 2021-08-24 ENCOUNTER — Other Ambulatory Visit: Payer: Self-pay | Admitting: Internal Medicine

## 2021-08-24 ENCOUNTER — Encounter: Payer: Self-pay | Admitting: Internal Medicine

## 2021-08-24 DIAGNOSIS — E1165 Type 2 diabetes mellitus with hyperglycemia: Secondary | ICD-10-CM

## 2021-08-24 MED ORDER — METFORMIN HCL 500 MG PO TABS: 1000.0000 mg | ORAL_TABLET | Freq: Two times a day (BID) | ORAL | 3 refills | Status: DC

## 2021-08-24 MED ORDER — PIOGLITAZONE HCL 45 MG PO TABS: 45.0000 mg | ORAL_TABLET | Freq: Every day | ORAL | 3 refills | Status: DC

## 2021-08-25 MED ORDER — TIRZEPATIDE 2.5 MG/0.5ML ~~LOC~~ SOAJ
2.5000 mg | SUBCUTANEOUS | 11 refills | Status: DC
Start: 2021-08-25 — End: 2021-12-04

## 2021-08-26 NOTE — Assessment & Plan Note (Signed)
Lab Results  Component Value Date   HGBA1C 13.3 (H) 08/23/2021   Severe uncontrolled, pt to continue current medical treatment metformin ER 500 gm - 2 po bid, also add actos 45 qd and mounjaro 2.5 weekly

## 2021-08-26 NOTE — Assessment & Plan Note (Signed)
BP Readings from Last 3 Encounters:  08/23/21 138/80  09/20/20 (!) 157/95  08/18/20 132/80   Stable, pt to continue medical treatment norvasc 10 mg qd, benicar 40 mg qd, hct 25 mg qd

## 2021-08-26 NOTE — Assessment & Plan Note (Signed)
Persistent to forehead, etiology unclear, for derm referral

## 2021-08-26 NOTE — Assessment & Plan Note (Signed)
Lab Results  Component Value Date   LDLCALC 202 (H) 06/29/2019   Severe uncontrolled, goal ldl < 100. pt to restart crestor 20 qd

## 2021-08-26 NOTE — Assessment & Plan Note (Signed)
Age and sex appropriate education and counseling updated with regular exercise and diet Referrals for preventative services - pt to call for pap smear herself per pt Immunizations addressed - none needed Smoking counseling  - none needed Evidence for depression or other mood disorder - none significant Most recent labs reviewed. I have personally reviewed and have noted: 1) the patient's medical and social history 2) The patient's current medications and supplements 3) The patient's height, weight, and BMI have been recorded in the chart

## 2021-10-10 DIAGNOSIS — L218 Other seborrheic dermatitis: Secondary | ICD-10-CM | POA: Diagnosis not present

## 2021-10-25 ENCOUNTER — Encounter: Payer: No Typology Code available for payment source | Admitting: Registered"

## 2021-10-29 ENCOUNTER — Encounter: Payer: Self-pay | Admitting: Internal Medicine

## 2021-10-30 MED ORDER — ROSUVASTATIN CALCIUM 20 MG PO TABS: 20.0000 mg | ORAL_TABLET | Freq: Every day | ORAL | 3 refills | Status: DC

## 2021-10-30 MED ORDER — HYDROCHLOROTHIAZIDE 25 MG PO TABS: 25.0000 mg | ORAL_TABLET | Freq: Every day | ORAL | 3 refills | Status: DC

## 2021-11-03 ENCOUNTER — Telehealth: Payer: Self-pay | Admitting: Neurology

## 2021-11-03 NOTE — Telephone Encounter (Signed)
Victoria Ryan comes in today for a study visit for the chimes MS.  She is on ocrelizumab.  She feels she is tolerating it well and has had no difficulty with her infusions.  She also feels her MS has been completely stable with no new symptoms.  She reports that her MS has negligible impact on her life.  She wishes to continue in the study.  EDSS is 1.5 (scoring one-point which for visual, bladder and cerebellar).  Her general exam is normal.

## 2021-11-04 NOTE — Telephone Encounter (Signed)
Her infusion went without complication

## 2021-11-13 MED ORDER — AMLODIPINE BESYLATE 10 MG PO TABS
10.0000 mg | ORAL_TABLET | Freq: Every day | ORAL | 3 refills | Status: DC
Start: 2021-11-13 — End: 2022-12-17

## 2021-11-13 MED ORDER — OLMESARTAN MEDOXOMIL 40 MG PO TABS: 40.0000 mg | ORAL_TABLET | Freq: Every day | ORAL | 3 refills | Status: DC

## 2021-11-13 MED ORDER — POTASSIUM CHLORIDE ER 10 MEQ PO TBCR
10.0000 meq | EXTENDED_RELEASE_TABLET | Freq: Every day | ORAL | 3 refills | Status: DC
Start: 2021-11-13 — End: 2022-12-17

## 2021-11-13 NOTE — Telephone Encounter (Signed)
Requested meds needing new script

## 2021-11-13 NOTE — Addendum Note (Signed)
Addended by: Biagio Borg on: 11/13/2021 03:31 PM   Modules accepted: Orders

## 2021-11-27 ENCOUNTER — Ambulatory Visit: Payer: No Typology Code available for payment source | Admitting: Internal Medicine

## 2021-12-04 ENCOUNTER — Ambulatory Visit (INDEPENDENT_AMBULATORY_CARE_PROVIDER_SITE_OTHER): Payer: BC Managed Care – PPO | Admitting: Internal Medicine

## 2021-12-04 VITALS — BP 120/68 | HR 78 | Temp 97.7°F | Ht 64.0 in | Wt 185.0 lb

## 2021-12-04 DIAGNOSIS — E1165 Type 2 diabetes mellitus with hyperglycemia: Secondary | ICD-10-CM | POA: Diagnosis not present

## 2021-12-04 DIAGNOSIS — E7800 Pure hypercholesterolemia, unspecified: Secondary | ICD-10-CM

## 2021-12-04 DIAGNOSIS — I10 Essential (primary) hypertension: Secondary | ICD-10-CM | POA: Diagnosis not present

## 2021-12-04 DIAGNOSIS — E559 Vitamin D deficiency, unspecified: Secondary | ICD-10-CM

## 2021-12-04 DIAGNOSIS — E538 Deficiency of other specified B group vitamins: Secondary | ICD-10-CM

## 2021-12-04 LAB — HEPATIC FUNCTION PANEL
ALT: 16 U/L (ref 0–35)
AST: 17 U/L (ref 0–37)
Albumin: 4.3 g/dL (ref 3.5–5.2)
Alkaline Phosphatase: 55 U/L (ref 39–117)
Bilirubin, Direct: 0.1 mg/dL (ref 0.0–0.3)
Total Bilirubin: 0.4 mg/dL (ref 0.2–1.2)
Total Protein: 7 g/dL (ref 6.0–8.3)

## 2021-12-04 LAB — LIPID PANEL
Cholesterol: 112 mg/dL (ref 0–200)
HDL: 55.2 mg/dL (ref >39.00)
LDL Cholesterol: 42 mg/dL (ref 0–99)
NonHDL: 57.04
Total CHOL/HDL Ratio: 2
Triglycerides: 73 mg/dL (ref 0.0–149.0)
VLDL: 14.6 mg/dL (ref 0.0–40.0)

## 2021-12-04 LAB — BASIC METABOLIC PANEL
BUN: 14 mg/dL (ref 6–23)
CO2: 29 mEq/L (ref 19–32)
Calcium: 9.3 mg/dL (ref 8.4–10.5)
Chloride: 100 mEq/L (ref 96–112)
Creatinine, Ser: 0.8 mg/dL (ref 0.40–1.20)
GFR: 91.32 mL/min (ref >60.00)
Glucose, Bld: 90 mg/dL (ref 70–99)
Potassium: 3.6 mEq/L (ref 3.5–5.1)
Sodium: 136 mEq/L (ref 135–145)

## 2021-12-04 LAB — HEMOGLOBIN A1C: Hgb A1c MFr Bld: 6.6 % — ABNORMAL HIGH (ref 4.6–6.5)

## 2021-12-04 MED ORDER — TIRZEPATIDE 5 MG/0.5ML ~~LOC~~ SOAJ: 5.0000 mg | SUBCUTANEOUS | 3 refills | Status: DC

## 2021-12-04 NOTE — Progress Notes (Unsigned)
Patient ID: Victoria Ryan, female   DOB: 10/25/1980, 41 y.o.   MRN: [id number]        Chief Complaint: follow up HTN, HLD and hyperglycemia        HPI:  Victoria Ryan is a 41 y.o. female here with c/o          Wt Readings from Last 3 Encounters:  12/04/21 185 lb (83.9 kg)  08/23/21 198 lb (89.8 kg)  09/20/20 218 lb (98.9 kg)   BP Readings from Last 3 Encounters:  12/04/21 120/68  08/23/21 138/80  09/20/20 (!) 157/95         Past Medical History:  Diagnosis Date   Anxiety    Chicken pox    Gestational diabetes    takes insulin & metformin   Hypertension    take labetalol   Multiple sclerosis (HCC) 12/2017   Status post tubal ligation 02/23/2019   Past Surgical History:  Procedure Laterality Date   CESAREAN SECTION     x2   CESAREAN SECTION WITH BILATERAL TUBAL LIGATION Bilateral 02/23/2019   Procedure: CESAREAN SECTION WITH BILATERAL TUBAL LIGATION;  [place]: Edwinna Areola, DO;  Location: [facility];  Service: Obstetrics;  Laterality: Bilateral;    reports that she has quit smoking. Her smoking use included cigars. She has never used smokeless tobacco. She reports current alcohol use. She reports that she does not currently use drugs. family history includes Breast cancer in her paternal grandmother; Heart disease in her father; Hyperlipidemia in her father; Hypertension in her father and mother. No Known Allergies Current Outpatient Medications on File Prior to Visit  Medication Sig Dispense Refill   amLODipine (NORVASC) 10 MG tablet Take 1 tablet (10 mg total) by mouth daily. 90 tablet 3   hydrochlorothiazide (HYDRODIURIL) 25 MG tablet Take 1 tablet (25 mg total) by mouth daily. 90 tablet 3   metFORMIN (GLUCOPHAGE) 500 MG tablet Take 2 tablets (1,000 mg total) by mouth 2 (two) times daily with a meal. 360 tablet 3   ocrelizumab (OCREVUS) 300 MG/10ML injection Inject 20 mLs (600 mg total) into the vein every 6 (six) months. 20 mL 5   olmesartan (BENICAR) 40 MG  tablet Take 1 tablet (40 mg total) by mouth daily. 90 tablet 3   pioglitazone (ACTOS) 45 MG tablet Take 1 tablet (45 mg total) by mouth daily. 90 tablet 3   potassium chloride (KLOR-CON) 10 MEQ tablet Take 1 tablet (10 mEq total) by mouth daily. 90 tablet 3   rosuvastatin (CRESTOR) 20 MG tablet Take 1 tablet (20 mg total) by mouth daily. 90 tablet 3   tirzepatide (MOUNJARO) 2.5 MG/0.5ML Pen Inject 2.5 mg into the skin once a week. 2 mL 11   No current facility-administered medications on file prior to visit.        ROS:  All others reviewed and negative.  Objective        PE:  BP 120/68 (BP Location: Right Arm, Patient Position: Sitting, Cuff Size: Large)   Pulse 78   Temp 97.7 F (36.5 C) (Oral)   Ht 5\' 4"  (1.626 m)   Wt 185 lb (83.9 kg)   BMI 31.76 kg/m                 Constitutional: Pt appears in NAD               HENT: Head: NCAT.  Right Ear: External ear normal.                 Left Ear: External ear normal.                Eyes: . Pupils are equal, round, and reactive to light. Conjunctivae and EOM are normal               Nose: without d/c or deformity               Neck: Neck supple. Gross normal ROM               Cardiovascular: Normal rate and regular rhythm.                 Pulmonary/Chest: Effort normal and breath sounds without rales or wheezing.                Abd:  Soft, NT, ND, + BS, no organomegaly               Neurological: Pt is alert. At baseline orientation, motor grossly intact               Skin: Skin is warm. No rashes, no other new lesions, LE edema - ***               Psychiatric: Pt behavior is normal without agitation   Micro: none  Cardiac tracings I have personally interpreted today:  none  Pertinent Radiological findings (summarize): none   Lab Results  Component Value Date   WBC 7.0 08/23/2021   HGB 14.4 08/23/2021   HCT 41.8 08/23/2021   PLT 301.0 08/23/2021   GLUCOSE 364 (H) 08/23/2021   CHOL 171 08/23/2021   TRIG 239.0  (H) 08/23/2021   HDL 47.90 08/23/2021   LDLDIRECT 94.0 08/23/2021   LDLCALC 202 (H) 06/29/2019   ALT 22 08/23/2021   AST 14 08/23/2021   NA 131 (L) 08/23/2021   K 3.9 08/23/2021   CL 97 08/23/2021   CREATININE 0.81 08/23/2021   BUN 19 08/23/2021   CO2 25 08/23/2021   TSH 1.55 08/23/2021   HGBA1C 13.3 (H) 08/23/2021   Assessment/Plan:  Victoria Ryan is a 41 y.o. Black or African American [2] female with  has a past medical history of Anxiety, Chicken pox, Gestational diabetes, Hypertension, Multiple sclerosis (HCC) (12/2017), and Status post tubal ligation (02/23/2019).  No problem-specific Assessment & Plan notes found for this encounter.  Followup: No follow-ups on file.  Oliver Barre, MD 12/04/2021 9:15 AM [facility]

## 2021-12-04 NOTE — Patient Instructions (Signed)
Ok to increase the mounjaro  Please be aware of too low sugars less than 100, or SBP (blood pressures) that get too low, like less than 110/50 as you lose weight  Please continue all other medications as before, and refills have been done if requested.  Please have the pharmacy call with any other refills you may need.  Please continue your efforts at being more active, low cholesterol diet, and weight control.  Please keep your appointments with your specialists as you may have planned  Please go to the LAB at the blood drawing area for the tests to be done  You will be contacted by phone if any changes need to be made immediately.  Otherwise, you will receive a letter about your results with an explanation, but please check with MyChart first.  Please make an Appointment to return in 6 months, or sooner if needed, also with Lab Appointment for testing done 3-5 days before at the FIRST FLOOR Lab (so this is for TWO appointments - please see the scheduling desk as you leave)

## 2021-12-07 ENCOUNTER — Encounter: Payer: Self-pay | Admitting: Internal Medicine

## 2021-12-07 NOTE — Assessment & Plan Note (Signed)
Lab Results  Component Value Date   HGBA1C 6.6 (H) 12/04/2021   Uncontrolled,, pt to continue current medical treatment metfomrin 1000 bid, actos 45 mg qd, but increase the mounjaro to 5 mg weekly

## 2021-12-07 NOTE — Assessment & Plan Note (Signed)
Last vitamin D Lab Results  Component Value Date   VD25OH 28.79 (L) 08/23/2021   Low, to start oral replacement

## 2021-12-07 NOTE — Assessment & Plan Note (Signed)
BP Readings from Last 3 Encounters:  12/04/21 120/68  08/23/21 138/80  09/20/20 (!) 157/95   Stable, pt to continue medical treatment benicar 40 mg qd, norvasc 10 mg qd, hct 25 mg qd

## 2021-12-07 NOTE — Assessment & Plan Note (Signed)
Lab Results  Component Value Date   LDLCALC 42 12/04/2021   Stable, pt to continue current statin crestor 20 mg qd

## 2022-01-11 ENCOUNTER — Other Ambulatory Visit: Payer: Self-pay | Admitting: Neurology

## 2022-01-11 DIAGNOSIS — G35 Multiple sclerosis: Secondary | ICD-10-CM

## 2022-01-16 ENCOUNTER — Other Ambulatory Visit: Payer: Self-pay | Admitting: Neurology

## 2022-01-16 DIAGNOSIS — G35 Multiple sclerosis: Secondary | ICD-10-CM

## 2022-02-02 ENCOUNTER — Encounter: Payer: Self-pay | Admitting: Internal Medicine

## 2022-02-06 NOTE — Telephone Encounter (Signed)
[place] for PA - to [place], thanks

## 2022-02-06 NOTE — Telephone Encounter (Signed)
[employer] called to follow up on prior authorization. Patient is currently out of medication.  Fax- [phone] Phone- [phone]

## 2022-02-13 ENCOUNTER — Other Ambulatory Visit (HOSPITAL_COMMUNITY): Payer: Self-pay

## 2022-02-13 NOTE — Telephone Encounter (Signed)
Pharmacy Patient Advocate Encounter  Prior Authorization for [facility] 5MG /0.5ML has been submitted and approved.    PA# [id number] Effective dates: 02/13/2022 through 02/13/2023

## 2022-04-13 ENCOUNTER — Other Ambulatory Visit: Payer: No Typology Code available for payment source

## 2022-04-16 ENCOUNTER — Other Ambulatory Visit: Payer: No Typology Code available for payment source

## 2022-04-19 ENCOUNTER — Ambulatory Visit
Admission: RE | Admit: 2022-04-19 | Discharge: 2022-04-19 | Disposition: A | Discharge status: Home / Self Care | Payer: No Typology Code available for payment source | Source: Ambulatory Visit | Attending: Neurology | Admitting: Neurology

## 2022-04-19 DIAGNOSIS — G35 Multiple sclerosis: Secondary | ICD-10-CM

## 2022-04-19 MED ORDER — GADOTERIDOL 279.3 MG/ML IV SOLN
18.0000 mL | Freq: Once | INTRAVENOUS | Status: AC | PRN
  Administered 2022-04-19: 18 mL via INTRAVENOUS

## 2022-04-20 ENCOUNTER — Telehealth: Payer: Self-pay | Admitting: Neurology

## 2022-04-20 NOTE — Telephone Encounter (Signed)
Vertigo spells started in January 2024 and she has a couple every week.  I instructed her on Brandt-Daroff exercises.

## 2022-04-20 NOTE — Telephone Encounter (Signed)
Victoria Ryan also reported a few spells of vertigo --- positional x 30 seconds -- over the last month.  This is mild and not related to study drug.    The infusion was completed without complications

## 2022-04-20 NOTE — Telephone Encounter (Signed)
Victoria Ryan comes in today for a CHIMES MS study visit.   She is on ocrelizumab 600 mg dose IV.  She has tolerated the medication well and has not had any difficulties with her infusions.  She feels her MS has been stable and she denies any exacerbation or new symptoms.  Her MS is having negligible impact on her life.  She wishes to continue in the study.  She does report her father died recently and she feels a little depressed but it the depression is not impacting her life.  She does not feel she needs any medication for this.  Today, the EDSS was 1.5 (one point for: visual, bladder and cerebellar).  General examination was normal.

## 2022-05-09 ENCOUNTER — Encounter: Payer: Self-pay | Admitting: Internal Medicine

## 2022-05-09 ENCOUNTER — Other Ambulatory Visit: Payer: Self-pay | Admitting: Internal Medicine

## 2022-05-10 MED ORDER — OZEMPIC (0.25 OR 0.5 MG/DOSE) 2 MG/3ML ~~LOC~~ SOPN: PEN_INJECTOR | SUBCUTANEOUS | 11 refills | Status: DC

## 2022-05-10 NOTE — Addendum Note (Signed)
Addended by: Corwin Levins on: 05/10/2022 05:43 PM   Modules accepted: Orders

## 2022-05-28 ENCOUNTER — Other Ambulatory Visit: Payer: No Typology Code available for payment source

## 2022-05-28 MED ORDER — PIOGLITAZONE HCL 30 MG PO TABS: 30.0000 mg | ORAL_TABLET | Freq: Every day | ORAL | 3 refills | Status: DC

## 2022-05-28 MED ORDER — TIRZEPATIDE 7.5 MG/0.5ML ~~LOC~~ SOAJ: 7.5000 mg | SUBCUTANEOUS | 3 refills | Status: DC

## 2022-05-28 NOTE — Telephone Encounter (Signed)
Ok to contact pt - let her know we had to increase the  mounjaro to 7.5 mg weekly as this might be easier to get at the pharmacy, since the 5 mg is on backorder  We also decreased the actos to 30 mg (from 45 mg) so hopefully she wont have worsening low sugar symptoms

## 2022-06-01 ENCOUNTER — Other Ambulatory Visit (INDEPENDENT_AMBULATORY_CARE_PROVIDER_SITE_OTHER): Payer: BC Managed Care – PPO

## 2022-06-01 DIAGNOSIS — E1165 Type 2 diabetes mellitus with hyperglycemia: Secondary | ICD-10-CM | POA: Diagnosis not present

## 2022-06-01 DIAGNOSIS — E559 Vitamin D deficiency, unspecified: Secondary | ICD-10-CM

## 2022-06-01 DIAGNOSIS — E538 Deficiency of other specified B group vitamins: Secondary | ICD-10-CM

## 2022-06-01 LAB — HEPATIC FUNCTION PANEL
ALT: 22 U/L (ref 0–35)
AST: 17 U/L (ref 0–37)
Albumin: 4.3 g/dL (ref 3.5–5.2)
Alkaline Phosphatase: 56 U/L (ref 39–117)
Bilirubin, Direct: 0.1 mg/dL (ref 0.0–0.3)
Total Bilirubin: 0.4 mg/dL (ref 0.2–1.2)
Total Protein: 7.2 g/dL (ref 6.0–8.3)

## 2022-06-01 LAB — BASIC METABOLIC PANEL
BUN: 20 mg/dL (ref 6–23)
CO2: 28 mEq/L (ref 19–32)
Calcium: 9.4 mg/dL (ref 8.4–10.5)
Chloride: 101 mEq/L (ref 96–112)
Creatinine, Ser: 0.88 mg/dL (ref 0.40–1.20)
GFR: 81.17 mL/min (ref >60.00)
Glucose, Bld: 86 mg/dL (ref 70–99)
Potassium: 3.6 mEq/L (ref 3.5–5.1)
Sodium: 136 mEq/L (ref 135–145)

## 2022-06-01 LAB — CBC WITH DIFFERENTIAL/PLATELET
Basophils Absolute: 0 10*3/uL (ref 0.0–0.1)
Basophils Relative: 0.3 % (ref 0.0–3.0)
Eosinophils Absolute: 0.1 10*3/uL (ref 0.0–0.7)
Eosinophils Relative: 0.8 % (ref 0.0–5.0)
HCT: 39.8 % (ref 36.0–46.0)
Hemoglobin: 13.3 g/dL (ref 12.0–15.0)
Lymphocytes Relative: 28.6 % (ref 12.0–46.0)
Lymphs Abs: 2.7 10*3/uL (ref 0.7–4.0)
MCHC: 33.4 g/dL (ref 30.0–36.0)
MCV: 91.2 fl (ref 78.0–100.0)
Monocytes Absolute: 0.7 10*3/uL (ref 0.1–1.0)
Monocytes Relative: 6.8 % (ref 3.0–12.0)
Neutro Abs: 6.1 10*3/uL (ref 1.4–7.7)
Neutrophils Relative %: 63.5 % (ref 43.0–77.0)
Platelets: 327 10*3/uL (ref 150.0–400.0)
RBC: 4.36 Mil/uL (ref 3.87–5.11)
RDW: 15 % (ref 11.5–15.5)
WBC: 9.6 10*3/uL (ref 4.0–10.5)

## 2022-06-01 LAB — URINALYSIS, ROUTINE W REFLEX MICROSCOPIC
Bilirubin Urine: NEGATIVE
Hgb urine dipstick: NEGATIVE
Ketones, ur: NEGATIVE
Leukocytes,Ua: NEGATIVE
Nitrite: NEGATIVE
Specific Gravity, Urine: 1.015 (ref 1.000–1.030)
Total Protein, Urine: NEGATIVE
Urine Glucose: NEGATIVE
Urobilinogen, UA: 0.2 (ref 0.0–1.0)
pH: 5.5 (ref 5.0–8.0)

## 2022-06-01 LAB — MICROALBUMIN / CREATININE URINE RATIO
Creatinine,U: 80.5 mg/dL
Microalb Creat Ratio: 9 mg/g (ref 0.0–30.0)
Microalb, Ur: 7.3 mg/dL — ABNORMAL HIGH (ref 0.0–1.9)

## 2022-06-01 LAB — LIPID PANEL
Cholesterol: 139 mg/dL (ref 0–200)
HDL: 56.3 mg/dL (ref >39.00)
LDL Cholesterol: 67 mg/dL (ref 0–99)
NonHDL: 82.56
Total CHOL/HDL Ratio: 2
Triglycerides: 80 mg/dL (ref 0.0–149.0)
VLDL: 16 mg/dL (ref 0.0–40.0)

## 2022-06-01 LAB — VITAMIN B12: Vitamin B-12: 978 pg/mL — ABNORMAL HIGH (ref 211–911)

## 2022-06-01 LAB — VITAMIN D 25 HYDROXY (VIT D DEFICIENCY, FRACTURES): VITD: 37.43 ng/mL (ref 30.00–100.00)

## 2022-06-01 LAB — HEMOGLOBIN A1C: Hgb A1c MFr Bld: 5.4 % (ref 4.6–6.5)

## 2022-06-01 LAB — TSH: TSH: 1.52 u[IU]/mL (ref 0.35–5.50)

## 2022-06-04 ENCOUNTER — Ambulatory Visit: Payer: No Typology Code available for payment source | Admitting: Internal Medicine

## 2022-06-05 ENCOUNTER — Encounter: Payer: Self-pay | Admitting: Internal Medicine

## 2022-06-05 ENCOUNTER — Ambulatory Visit: Payer: Managed Care, Other (non HMO) | Admitting: Internal Medicine

## 2022-06-05 VITALS — BP 110/68 | HR 82 | Temp 98.2°F | Ht 64.0 in | Wt 150.0 lb

## 2022-06-05 DIAGNOSIS — E559 Vitamin D deficiency, unspecified: Secondary | ICD-10-CM | POA: Diagnosis not present

## 2022-06-05 DIAGNOSIS — E7800 Pure hypercholesterolemia, unspecified: Secondary | ICD-10-CM | POA: Diagnosis not present

## 2022-06-05 DIAGNOSIS — E0590 Thyrotoxicosis, unspecified without thyrotoxic crisis or storm: Secondary | ICD-10-CM | POA: Insufficient documentation

## 2022-06-05 DIAGNOSIS — G129 Spinal muscular atrophy, unspecified: Secondary | ICD-10-CM | POA: Insufficient documentation

## 2022-06-05 DIAGNOSIS — E538 Deficiency of other specified B group vitamins: Secondary | ICD-10-CM

## 2022-06-05 DIAGNOSIS — Z0000 Encounter for general adult medical examination without abnormal findings: Secondary | ICD-10-CM

## 2022-06-05 DIAGNOSIS — E1165 Type 2 diabetes mellitus with hyperglycemia: Secondary | ICD-10-CM | POA: Diagnosis not present

## 2022-06-05 DIAGNOSIS — O24419 Gestational diabetes mellitus in pregnancy, unspecified control: Secondary | ICD-10-CM | POA: Insufficient documentation

## 2022-06-05 DIAGNOSIS — Z7984 Long term (current) use of oral hypoglycemic drugs: Secondary | ICD-10-CM

## 2022-06-05 DIAGNOSIS — I10 Essential (primary) hypertension: Secondary | ICD-10-CM

## 2022-06-05 DIAGNOSIS — Z7985 Long-term (current) use of injectable non-insulin antidiabetic drugs: Secondary | ICD-10-CM

## 2022-06-05 DIAGNOSIS — Z8632 Personal history of gestational diabetes: Secondary | ICD-10-CM | POA: Insufficient documentation

## 2022-06-05 DIAGNOSIS — Z0001 Encounter for general adult medical examination with abnormal findings: Secondary | ICD-10-CM

## 2022-06-05 NOTE — Assessment & Plan Note (Signed)
Lab Results  Component Value Date   LDLCALC 67 06/01/2022   Stable, pt to continue current statin crestor 20 qd

## 2022-06-05 NOTE — Assessment & Plan Note (Signed)
Lab Results  Component Value Date   HGBA1C 5.4 06/01/2022   Excellent now overcontrolled with wt loss and mounjaro, ok to d/c actos, pt to continue current medical treatment metformin 1000 bid, mounjaro 7.5 q wk

## 2022-06-05 NOTE — Assessment & Plan Note (Signed)
Last vitamin D Lab Results  Component Value Date   VD25OH 37.43 06/01/2022   Low, to start oral replacement

## 2022-06-05 NOTE — Patient Instructions (Signed)
Ok to stop the actos (pioglitazone)  Please continue all other medications as before, and refills have been done if requested.  Please have the pharmacy call with any other refills you may need.  Please continue your efforts at being more active, low cholesterol diet, and weight control.  You are otherwise up to date with prevention measures today.  Please keep your appointments with your specialists as you may have planned  Please make an Appointment to return in 6 months, or sooner if needed, also with Lab Appointment for testing done 3-5 days before at the FIRST FLOOR Lab (so this is for TWO appointments - please see the scheduling desk as you leave)

## 2022-06-05 NOTE — Progress Notes (Signed)
Patient ID: Victoria Ryan, female   DOB: Jan 27, 1980, 42 y.o.   MRN: [id number]         Chief Complaint:: wellness exam and dm, htn, hld, low Vit D and B12       HPI:  Victoria Ryan is a 42 y.o. female here for wellness exam; plans to call for eye doctor appt soon;  pt will make her GYN appt soon, o/w up to date               Also lost 35 lbs with mounjaro and tolerating well.  Overall quite please now at normal wt.  Pt denies chest pain, increased sob or doe, wheezing, orthopnea, PND, increased LE swelling, palpitations, dizziness or syncope.   Pt denies polydipsia, polyuria, or new focal neuro s/s.    Pt denies fever, wt loss, night sweats, loss of appetite, or other constitutional symptoms     Wt Readings from Last 3 Encounters:  06/05/22 150 lb (68 kg)  12/04/21 185 lb (83.9 kg)  08/23/21 198 lb (89.8 kg)   BP Readings from Last 3 Encounters:  06/05/22 110/68  12/04/21 120/68  08/23/21 138/80   Immunization History  Administered Date(s) Administered   Influenza,inj,Quad PF,6+ Mos 12/23/2017   Influenza-Unspecified 11/12/2014, 09/25/2018   PFIZER(Purple Top)SARS-COV-2 Vaccination 04/09/2019, 05/04/2019   PNEUMOCOCCAL CONJUGATE-20 08/18/2020   Tdap 06/13/2012, 12/22/2018   Health Maintenance Due  Topic Date Due   OPHTHALMOLOGY EXAM  03/13/2022      Past Medical History:  Diagnosis Date   Anxiety    Chicken pox    Gestational diabetes    takes insulin & metformin   Hypertension    take labetalol   Multiple sclerosis (HCC) 12/2017   Status post tubal ligation 02/23/2019   Past Surgical History:  Procedure Laterality Date   CESAREAN SECTION     x2   CESAREAN SECTION WITH BILATERAL TUBAL LIGATION Bilateral 02/23/2019   Procedure: CESAREAN SECTION WITH BILATERAL TUBAL LIGATION;  [place]: Edwinna Areola, DO;  Location: [facility] LD ORS;  Service: Obstetrics;  Laterality: Bilateral;    reports that she has quit smoking. Her smoking use included cigars. She has never  used smokeless tobacco. She reports current alcohol use. She reports that she does not currently use drugs. family history includes Breast cancer in her paternal grandmother; Heart disease in her father; Hyperlipidemia in her father; Hypertension in her father and mother. No Known Allergies Current Outpatient Medications on File Prior to Visit  Medication Sig Dispense Refill   amLODipine (NORVASC) 10 MG tablet Take 1 tablet (10 mg total) by mouth daily. 90 tablet 3   hydrochlorothiazide (HYDRODIURIL) 25 MG tablet Take 1 tablet (25 mg total) by mouth daily. 90 tablet 3   metFORMIN (GLUCOPHAGE) 500 MG tablet Take 2 tablets (1,000 mg total) by mouth 2 (two) times daily with a meal. 360 tablet 3   ocrelizumab (OCREVUS) 300 MG/10ML injection Inject 20 mLs (600 mg total) into the vein every 6 (six) months. 20 mL 5   olmesartan (BENICAR) 40 MG tablet Take 1 tablet (40 mg total) by mouth daily. 90 tablet 3   potassium chloride (KLOR-CON) 10 MEQ tablet Take 1 tablet (10 mEq total) by mouth daily. 90 tablet 3   rosuvastatin (CRESTOR) 20 MG tablet Take 1 tablet (20 mg total) by mouth daily. 90 tablet 3   tirzepatide (MOUNJARO) 7.5 MG/0.5ML Pen Inject 7.5 mg into the skin once a week. 6 mL 3   No current facility-administered  medications on file prior to visit.        ROS:  All others reviewed and negative.  Objective        PE:  BP 110/68 (BP Location: Left Arm, Patient Position: Sitting, Cuff Size: Normal)   Pulse 82   Temp 98.2 F (36.8 C) (Oral)   Ht 5\' 4"  (1.626 m)   Wt 150 lb (68 kg)   LMP 06/01/2022 (Exact Date)   SpO2 97%   Breastfeeding No   BMI 25.75 kg/m                 Constitutional: Pt appears in NAD               HENT: Head: NCAT.                Right Ear: External ear normal.                 Left Ear: External ear normal.                Eyes: . Pupils are equal, round, and reactive to light. Conjunctivae and EOM are normal               Nose: without d/c or deformity                Neck: Neck supple. Gross normal ROM               Cardiovascular: Normal rate and regular rhythm.                 Pulmonary/Chest: Effort normal and breath sounds without rales or wheezing.                Abd:  Soft, NT, ND, + BS, no organomegaly               Neurological: Pt is alert. At baseline orientation, motor grossly intact               Skin: Skin is warm. No rashes, no other new lesions, LE edema - none               Psychiatric: Pt behavior is normal without agitation   Micro: none  Cardiac tracings I have personally interpreted today:  none  Pertinent Radiological findings (summarize): none   Lab Results  Component Value Date   WBC 9.6 06/01/2022   HGB 13.3 06/01/2022   HCT 39.8 06/01/2022   PLT 327.0 06/01/2022   GLUCOSE 86 06/01/2022   CHOL 139 06/01/2022   TRIG 80.0 06/01/2022   HDL 56.30 06/01/2022   LDLDIRECT 94.0 08/23/2021   LDLCALC 67 06/01/2022   ALT 22 06/01/2022   AST 17 06/01/2022   NA 136 06/01/2022   K 3.6 06/01/2022   CL 101 06/01/2022   CREATININE 0.88 06/01/2022   BUN 20 06/01/2022   CO2 28 06/01/2022   TSH 1.52 06/01/2022   HGBA1C 5.4 06/01/2022   MICROALBUR 7.3 (H) 06/01/2022   Assessment/Plan:  Victoria Ryan is a 42 y.o. Black or African American [2] female with  has a past medical history of Anxiety, Chicken pox, Gestational diabetes, Hypertension, Multiple sclerosis (HCC) (12/2017), and Status post tubal ligation (02/23/2019).  Encounter for well adult exam with abnormal findings Age and sex appropriate education and counseling updated with regular exercise and diet Referrals for preventative services - pt to call for GYN and eye doctor appt soon Immunizations addressed - none needed Smoking counseling  -  none needed Evidence for depression or other mood disorder - none significant Most recent labs reviewed. I have personally reviewed and have noted: 1) the patient's medical and social history 2) The patient's current  medications and supplements 3) The patient's height, weight, and BMI have been recorded in the chart   Essential hypertension, benign BP Readings from Last 3 Encounters:  06/05/22 110/68  12/04/21 120/68  08/23/21 138/80   Stable, pt to continue medical treatment benicar 40 qd, hct 25 qd, norvasc 5 qd   Hyperlipidemia Lab Results  Component Value Date   LDLCALC 67 06/01/2022   Stable, pt to continue current statin crestor 20 qd  Diabetes (HCC) Lab Results  Component Value Date   HGBA1C 5.4 06/01/2022   Excellent now overcontrolled with wt loss and mounjaro, ok to d/c actos, pt to continue current medical treatment metformin 1000 bid, mounjaro 7.5 q wk   Vitamin D deficiency Last vitamin D Lab Results  Component Value Date   VD25OH 37.43 06/01/2022   Low, to start oral replacement   B12 deficiency Lab Results  Component Value Date   VITAMINB12 978 (H) 06/01/2022   Stable, cont oral replacement - b12 1000 mcg qd  Followup: Return in about 6 months (around 12/06/2022).  Oliver Barre, MD 06/05/2022 8:23 PM [facility]

## 2022-06-05 NOTE — Assessment & Plan Note (Signed)
BP Readings from Last 3 Encounters:  06/05/22 110/68  12/04/21 120/68  08/23/21 138/80   Stable, pt to continue medical treatment benicar 40 qd, hct 25 qd, norvasc 5 qd

## 2022-06-05 NOTE — Assessment & Plan Note (Signed)
Age and sex appropriate education and counseling updated with regular exercise and diet Referrals for preventative services - pt to call for GYN and eye doctor appt soon Immunizations addressed - none needed Smoking counseling  - none needed Evidence for depression or other mood disorder - none significant Most recent labs reviewed. I have personally reviewed and have noted: 1) the patient's medical and social history 2) The patient's current medications and supplements 3) The patient's height, weight, and BMI have been recorded in the chart

## 2022-06-05 NOTE — Assessment & Plan Note (Signed)
Lab Results  Component Value Date   VITAMINB12 978 (H) 06/01/2022   Stable, cont oral replacement - b12 1000 mcg qd

## 2022-07-11 LAB — HM MAMMOGRAPHY

## 2022-07-12 ENCOUNTER — Encounter: Payer: Self-pay | Admitting: Obstetrics & Gynecology

## 2022-09-13 ENCOUNTER — Other Ambulatory Visit: Payer: Self-pay | Admitting: Internal Medicine

## 2022-10-31 ENCOUNTER — Telehealth: Payer: Self-pay | Admitting: *Deleted

## 2022-10-31 NOTE — Telephone Encounter (Signed)
LVM for pt to call office. Pt will be ending with [facility] research soon for Ocrevus. She needs updated visit with Dr. Epimenio Foot. Dr. Epimenio Foot said we can do mychart virtual visit. Offered tomorrow at 11am w/ Dr. Epimenio Foot. Asked her to call back today before 5pm to let us know if this works.

## 2022-10-31 NOTE — Telephone Encounter (Signed)
LVM for pt to call office again

## 2022-10-31 NOTE — Telephone Encounter (Signed)
Scheduled pt for Appointment on 11/13/2022 @ 9:00am

## 2022-11-13 ENCOUNTER — Telehealth (INDEPENDENT_AMBULATORY_CARE_PROVIDER_SITE_OTHER): Payer: BC Managed Care – PPO | Admitting: Neurology

## 2022-11-13 ENCOUNTER — Encounter: Payer: Self-pay | Admitting: Neurology

## 2022-11-13 ENCOUNTER — Telehealth: Payer: Self-pay | Admitting: Neurology

## 2022-11-13 ENCOUNTER — Telehealth: Payer: Self-pay | Admitting: *Deleted

## 2022-11-13 DIAGNOSIS — E1165 Type 2 diabetes mellitus with hyperglycemia: Secondary | ICD-10-CM | POA: Diagnosis not present

## 2022-11-13 DIAGNOSIS — G35 Multiple sclerosis: Secondary | ICD-10-CM | POA: Diagnosis not present

## 2022-11-13 DIAGNOSIS — Z7984 Long term (current) use of oral hypoglycemic drugs: Secondary | ICD-10-CM | POA: Diagnosis not present

## 2022-11-13 DIAGNOSIS — G4700 Insomnia, unspecified: Secondary | ICD-10-CM | POA: Diagnosis not present

## 2022-11-13 MED ORDER — TRAZODONE HCL 50 MG PO TABS
50.0000 mg | ORAL_TABLET | Freq: Every day | ORAL | 3 refills | Status: DC
Start: 2022-11-13 — End: 2023-11-14

## 2022-11-13 NOTE — Progress Notes (Signed)
[facility]  PATIENT: Victoria Ryan DOB: [birth year]  REFERRING DOCTOR OR PCP: Oliver Barre, MD  _________________________________   HISTORICAL  CHIEF COMPLAINT:  Chief Complaint  Patient presents with   Multiple Sclerosis    On OCrevus    HISTORY OF PRESENT ILLNESS:  Victoria Ryan is a 42 y.o. 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  Virtual Visit via Video Note I connected with Victoria Ryan on 11/13/22 at  9:00 AM EDT by a video enabled telemedicine application and verified that I am speaking with the correct person.  I discussed the limitations of evaluation and management by telemedicine and the availability of in person appointments. The patient expressed understanding and agreed to proceed.  Was in her home.  Provider was in the office.   Update 11/13/22  She is on Ocrevus.  She denies exacerbations or new neurologic symptoms.  In the past with gait have mostly resolved.  Balance is still slightly off when she does a tandem walk.  She can go up and down stairs well.  She keeps up with others for longer walks.  She no longer has painful dysesthesias..  She also denies weakness.    Bladder function is fine.   Vsion is unchanged.     She has fatigue.   She sleeps poorly some nights more diffciulty with sleep maintenance.   She has insomnia and delayed phase sleep.   She is often up late.   She sometimes watches TV to fall asleep (around 2 am).  She works a late shift and is done at 9-930 pm.    She stays up late but then has to wake up to get kids to school.    We went over her strategy will she will try to advance her bedtime from 2 AM to 11 PM 15 minutes at night.  She will take melatonin 60 to 90 minutes before her planned bedtime and trazodone 25 to 30 minutes before her planned bedtime.  MS HIstory: She inoted numbness on the right laeg and arm Monday 12/24/2017.   She was under a lot of stress and noted anxiety.   She  saw her PCP and was told that symptoms would likely improve after af few days off.     She also noted her voice was mildly slurred and the left face was num (also milder right face).   She did not note diplopia.   She had no problems with eating or swallowing.   She noted that her handwriting and typing were off.     When symptoms persisted she had a brain MRI 12/30/2017.  I reviewed the report and personally reviewed the images.  The brain MRI showed a large focus in the left pons and a smaller subcortical focus in the left parietal lobe.  Additionally there appeared to be a small periventricular focus on the left.  She had a couple of more nonspecific appearing T2/flair hyperintense foci in the subcortical or deep white matter of the hemispheres.  She also noted some difficulty with her right vision.   She has mildly reduced acuity and altered color vision out of that eye.    She started Vumerity but stopped in 2020 for pregnancy.   After the pregnancy she started Ocrevus.    IMAGING: MRI of the brain 04/19/2022 showed no new lesions.  MRI of the brain 05/12/2021 showed no new lesions.  MRI Brain 06/12/2020 showed multiple T2/FLAIR hyperintense foci in the brainstem, right  middle cerebellar peduncle, thalamus and hemispheres consistent with chronic demyelinating plaque associated with multiple sclerosis.  None of the foci enhance.  Compared to the MRI dated 12/17/2019, there are no new lesions  MRI Brain 01/26/2018 showed large left pontine focus previously seen on MRI from 12/30/2017 is noted to have incomplete peripheral enhancement on this contrasted study.  This is consistent with a subacute demyelinating focus.   This pattern combined with the 6 other foci observed in the supratentorial and left thalamic white matter is consistent with multiple sclerosis  MRI cervical and thoracic spine 01/26/2018 and 01/29/2018 showed normal spinal cord   REVIEW OF SYSTEMS: Constitutional: No fevers, chills, sweats,  or change in appetite.  She has insomnia. Eyes: No visual changes, double vision, eye pain Ear, nose and throat: No hearing loss, ear pain, nasal congestion, sore throat Cardiovascular: No chest pain, palpitations Respiratory:  No shortness of breath at rest or with exertion.   No wheezes GastrointestinaI: No nausea, vomiting, diarrhea, abdominal pain, fecal incontinence Genitourinary: Urinary frequency and urgency with occasional urge incontinence.   Musculoskeletal:  No neck pain, back pain Integumentary: No rash, pruritus, skin lesions Neurological: as above Psychiatric: No depression at this time.  No anxiety Endocrine: No palpitations, diaphoresis, change in appetite, change in weigh or increased thirst Hematologic/Lymphatic:  No anemia, purpura, petechiae. Allergic/Immunologic: No itchy/runny eyes, nasal congestion, recent allergic reactions, rashes  ALLERGIES: No Known Allergies  HOME MEDICATIONS:  Current Outpatient Medications:    traZODone (DESYREL) 50 MG tablet, Take 1 tablet (50 mg total) by mouth at bedtime., Disp: 90 tablet, Rfl: 3   amLODipine (NORVASC) 10 MG tablet, Take 1 tablet (10 mg total) by mouth daily., Disp: 90 tablet, Rfl: 3   hydrochlorothiazide (HYDRODIURIL) 25 MG tablet, Take 1 tablet (25 mg total) by mouth daily., Disp: 90 tablet, Rfl: 3   metFORMIN (GLUCOPHAGE) 500 MG tablet, Take 2 tablets (1,000 mg total) by mouth 2 (two) times daily with a meal., Disp: 360 tablet, Rfl: 3   ocrelizumab (OCREVUS) 300 MG/10ML injection, Inject 20 mLs (600 mg total) into the vein every 6 (six) months., Disp: 20 mL, Rfl: 5   olmesartan (BENICAR) 40 MG tablet, Take 1 tablet (40 mg total) by mouth daily., Disp: 90 tablet, Rfl: 3   potassium chloride (KLOR-CON) 10 MEQ tablet, Take 1 tablet (10 mEq total) by mouth daily., Disp: 90 tablet, Rfl: 3   rosuvastatin (CRESTOR) 20 MG tablet, Take 1 tablet (20 mg total) by mouth daily., Disp: 90 tablet, Rfl: 3   tirzepatide (MOUNJARO) 7.5  MG/0.5ML Pen, Inject 7.5 mg into the skin once a week., Disp: 6 mL, Rfl: 3  PAST MEDICAL HISTORY: Past Medical History:  Diagnosis Date   Anxiety    Chicken pox    Gestational diabetes    takes insulin & metformin   Hypertension    take labetalol   Multiple sclerosis (HCC) 12/2017   Status post tubal ligation 02/23/2019    PAST SURGICAL HISTORY: Past Surgical History:  Procedure Laterality Date   CESAREAN SECTION     x2   CESAREAN SECTION WITH BILATERAL TUBAL LIGATION Bilateral 02/23/2019   Procedure: CESAREAN SECTION WITH BILATERAL TUBAL LIGATION;  Lao: Edwinna Areola, DO;  Location: [facility]S;  Service: Obstetrics;  Laterality: Bilateral;    FAMILY HISTORY: Family History  Problem Relation Age of Onset   Hypertension Mother    Heart disease Father    Hyperlipidemia Father    Hypertension Father    Breast  cancer Paternal Grandmother    Cancer Neg Hx     SOCIAL HISTORY:  Social History   Socioeconomic History   Marital status: Married    Spouse name: [name]   Number of children: 2   Years of education: 14   Highest education level: Not on file  Occupational History   Occupation: Clinical biochemist Rep  Tobacco Use   Smoking status: Former    Types: Cigars   Smokeless tobacco: Never  Vaping Use   Vaping status: Never Used  Substance and Sexual Activity   Alcohol use: Yes    Comment: occasionally wine   Drug use: Not Currently   Sexual activity: Yes    Birth control/protection: None  Other Topics Concern   Not on file  Social History Narrative   Lives w/ husband and kids   Regular exercise-yes   Caffeine Use-yes   Right handed    Social Determinants of Health   Financial Resource Strain: Not on file  Food Insecurity: Not on file  Transportation Needs: Not on file  Physical Activity: Not on file  Stress: Not on file  Social Connections: Not on file  Intimate Partner Violence: Not on file     PHYSICAL EXAM  There were no  vitals filed for this visit.   There is no height or weight on file to calculate BMI.  Weight is 145 pounds   Height is 5'4"  General: The patient is well-developed and well-nourished and in no acute distress   Neurologic Exam  Mental status: The patient is alert and oriented x 3 at the time of the examination. The patient has apparent normal recent and remote memory, with an apparently normal attention span and concentration ability.   Speech is normal.  Cranial nerves: Extraocular movements are full.    Facial strength is normal.  Trapezius strength is normal. Hearing appears normal. .  Motor: Normal muscle tone and bulk.  Normal strength.    Other: Tinels sign at right wrist   Sensory: She has intact sensation to touch, temperature and vibration.  Coordination: Cerebellar shows good finger-nose-finger and heel-to-shin  Gait and station: Station is normal.   Gait is normal. Tandem gait is wide.  Romberg is negative.  Reflexes: Deep tendon reflexes are symmetric and normal bilaterally.    EDSS 04/20/2022 was 1.5    DIAGNOSTIC DATA (LABS, IMAGING, TESTING) - I reviewed patient records, labs, notes, testing and imaging myself where available.  Lab Results  Component Value Date   WBC 9.6 06/01/2022   HGB 13.3 06/01/2022   HCT 39.8 06/01/2022   MCV 91.2 06/01/2022   PLT 327.0 06/01/2022      Component Value Date/Time   NA 136 06/01/2022 0946   K 3.6 06/01/2022 0946   CL 101 06/01/2022 0946   CO2 28 06/01/2022 0946   GLUCOSE 86 06/01/2022 0946   BUN 20 06/01/2022 0946   CREATININE 0.88 06/01/2022 0946   CALCIUM 9.4 06/01/2022 0946   PROT 7.2 06/01/2022 0946   PROT 7.3 02/06/2018 1207   ALBUMIN 4.3 06/01/2022 0946   ALBUMIN 4.7 02/06/2018 1207   AST 17 06/01/2022 0946   ALT 22 06/01/2022 0946   ALKPHOS 56 06/01/2022 0946   BILITOT 0.4 06/01/2022 0946   BILITOT 0.3 02/06/2018 1207   GFRNONAA >60 02/19/2019 1428   GFRAA >60 02/19/2019 1428   Lab Results   Component Value Date   CHOL 139 06/01/2022   HDL 56.30 06/01/2022   LDLCALC 67 06/01/2022  LDLDIRECT 94.0 08/23/2021   TRIG 80.0 06/01/2022   CHOLHDL 2 06/01/2022   Lab Results  Component Value Date   HGBA1C 5.4 06/01/2022   Lab Results  Component Value Date   VITAMINB12 978 (H) 06/01/2022   Lab Results  Component Value Date   TSH 1.52 06/01/2022       ASSESSMENT AND PLAN    1. Multiple sclerosis (HCC)   2. Type 2 diabetes mellitus with hyperglycemia, without long-term current use of insulin (HCC)   3. Insomnia, unspecified type       1.   Continue Ocrevus.  She had been getting the Ocrevus through the time study but will need to transition to the commercial drug.  She signed the service request form.  Her next infusion will be in March 2025. 2.   Numbness is doing better.  If the hand numbness worsens again we will check an NCV/EMG study. 3.   We had a long discussion about strategies to help her delayed phase sleep disorder and insomnia.  She works until 9:30 PM and often does not go to bed until 2 AM.  We discussed about slowly shifting her sleep time to 11 PM so that she will be more in sync with her kids.  She will use melatonin and trazodone to help. 4.  .Return in 6 months or sooner if there are new or worsening neurologic symptoms.   Follow Up Instructions: I discussed the assessment and treatment plan with the patient. The patient was provided an opportunity to ask questions and all were answered. The patient agreed with the plan and demonstrated an understanding of the instructions.    The patient was advised to call back or seek an in-person evaluation if the symptoms worsen or if the condition fails to improve as anticipated.  I provided 25 minutes of non-face-to-face time during this encounter.  Chinedum Vanhouten A. Epimenio Foot, MD, PhD, FAAN Certified in Neurology, Clinical Neurophysiology, Sleep Medicine, Pain Medicine and Neuroimaging Director, Multiple Sclerosis  Center at [facility]  [facility] [address] [place], [place] [address] [phone]

## 2022-11-13 NOTE — Telephone Encounter (Signed)
LVM and sent MyChart message asking pt to call back and schedule 6 month appt.

## 2022-11-13 NOTE — Telephone Encounter (Addendum)
Faxed complete/signed Ocrevus start form to [employer] at [phone]. Received fax confirmation. Gave completed start form to [facility] and signed order.  Last infusion with [facility] on 10/02/2022

## 2022-11-27 ENCOUNTER — Ambulatory Visit: Payer: BC Managed Care – PPO | Admitting: Internal Medicine

## 2022-11-27 ENCOUNTER — Encounter: Payer: Self-pay | Admitting: Neurology

## 2022-12-16 ENCOUNTER — Other Ambulatory Visit: Payer: Self-pay | Admitting: Internal Medicine

## 2022-12-17 ENCOUNTER — Other Ambulatory Visit: Payer: Self-pay

## 2022-12-31 ENCOUNTER — Ambulatory Visit: Payer: BC Managed Care – PPO | Admitting: Internal Medicine

## 2023-01-11 ENCOUNTER — Ambulatory Visit (INDEPENDENT_AMBULATORY_CARE_PROVIDER_SITE_OTHER): Payer: BC Managed Care – PPO | Admitting: Internal Medicine

## 2023-01-11 ENCOUNTER — Encounter: Payer: Self-pay | Admitting: Internal Medicine

## 2023-01-11 VITALS — BP 118/70 | HR 92 | Temp 98.4°F | Ht 64.0 in | Wt 150.0 lb

## 2023-01-11 DIAGNOSIS — E1165 Type 2 diabetes mellitus with hyperglycemia: Secondary | ICD-10-CM

## 2023-01-11 DIAGNOSIS — Z7985 Long-term (current) use of injectable non-insulin antidiabetic drugs: Secondary | ICD-10-CM

## 2023-01-11 DIAGNOSIS — E559 Vitamin D deficiency, unspecified: Secondary | ICD-10-CM

## 2023-01-11 DIAGNOSIS — E7800 Pure hypercholesterolemia, unspecified: Secondary | ICD-10-CM | POA: Diagnosis not present

## 2023-01-11 DIAGNOSIS — I10 Essential (primary) hypertension: Secondary | ICD-10-CM

## 2023-01-11 DIAGNOSIS — Z23 Encounter for immunization: Secondary | ICD-10-CM

## 2023-01-11 LAB — LIPID PANEL
Cholesterol: 125 mg/dL (ref 0–200)
HDL: 66.3 mg/dL (ref >39.00)
LDL Cholesterol: 44 mg/dL (ref 0–99)
NonHDL: 58.48
Total CHOL/HDL Ratio: 2
Triglycerides: 73 mg/dL (ref 0.0–149.0)
VLDL: 14.6 mg/dL (ref 0.0–40.0)

## 2023-01-11 LAB — BASIC METABOLIC PANEL
BUN: 22 mg/dL (ref 6–23)
CO2: 28 meq/L (ref 19–32)
Calcium: 9.7 mg/dL (ref 8.4–10.5)
Chloride: 101 meq/L (ref 96–112)
Creatinine, Ser: 0.77 mg/dL (ref 0.40–1.20)
GFR: 94.87 mL/min (ref >60.00)
Glucose, Bld: 89 mg/dL (ref 70–99)
Potassium: 3.6 meq/L (ref 3.5–5.1)
Sodium: 138 meq/L (ref 135–145)

## 2023-01-11 LAB — HEPATIC FUNCTION PANEL
ALT: 57 U/L — ABNORMAL HIGH (ref 0–35)
AST: 27 U/L (ref 0–37)
Albumin: 4.5 g/dL (ref 3.5–5.2)
Alkaline Phosphatase: 78 U/L (ref 39–117)
Bilirubin, Direct: 0.1 mg/dL (ref 0.0–0.3)
Total Bilirubin: 0.4 mg/dL (ref 0.2–1.2)
Total Protein: 7.3 g/dL (ref 6.0–8.3)

## 2023-01-11 LAB — HEMOGLOBIN A1C: Hgb A1c MFr Bld: 5.7 % (ref 4.6–6.5)

## 2023-01-11 MED ORDER — AMLODIPINE BESYLATE 5 MG PO TABS: 5.0000 mg | ORAL_TABLET | Freq: Every day | ORAL | 3 refills | Status: DC

## 2023-01-11 NOTE — Progress Notes (Signed)
The test results show that your current treatment is OK, as the tests are stable.  Please continue the same plan.  There is no other need for change of treatment or further evaluation based on these results, at this time.  thanks 

## 2023-01-11 NOTE — Progress Notes (Unsigned)
Patient ID: Victoria Ryan, female   DOB: [birth year], 42 y.o.   MRN: [id number]        Chief Complaint: follow up HTN, HLD and hyperglycemia ***       HPI:  Victoria Ryan is a 42 y.o. female here with c/o        Wt Readings from Last 3 Encounters:  01/11/23 150 lb (68 kg)  06/05/22 150 lb (68 kg)  12/04/21 185 lb (83.9 kg)   BP Readings from Last 3 Encounters:  01/11/23 118/70  06/05/22 110/68  12/04/21 120/68         Past Medical History:  Diagnosis Date   Anxiety    Chicken pox    Gestational diabetes    takes insulin & metformin   Hypertension    take labetalol   Multiple sclerosis (HCC) 12/2017   Status post tubal ligation 02/23/2019   Past Surgical History:  Procedure Laterality Date   CESAREAN SECTION     x2   CESAREAN SECTION WITH BILATERAL TUBAL LIGATION Bilateral 02/23/2019   Procedure: CESAREAN SECTION WITH BILATERAL TUBAL LIGATION;  Meleski: Edwinna Areola, DO;  Location: [facility];  Service: Obstetrics;  Laterality: Bilateral;    reports that she has quit smoking. Her smoking use included cigars. She has never used smokeless tobacco. She reports current alcohol use. She reports that she does not currently use drugs. family history includes Breast cancer in her paternal grandmother; Heart disease in her father; Hyperlipidemia in her father; Hypertension in her father and mother. No Known Allergies Current Outpatient Medications on File Prior to Visit  Medication Sig Dispense Refill   amLODipine (NORVASC) 10 MG tablet TAKE 1 TABLET BY MOUTH EVERY DAY 90 tablet 3   hydrochlorothiazide (HYDRODIURIL) 25 MG tablet Take 1 tablet (25 mg total) by mouth daily. 90 tablet 3   metFORMIN (GLUCOPHAGE) 500 MG tablet Take 2 tablets (1,000 mg total) by mouth 2 (two) times daily with a meal. 360 tablet 3   ocrelizumab (OCREVUS) 300 MG/10ML injection Inject 20 mLs (600 mg total) into the vein every 6 (six) months. 20 mL 5   olmesartan (BENICAR) 40 MG tablet TAKE 1  TABLET BY MOUTH EVERY DAY 90 tablet 3   potassium chloride (KLOR-CON) 10 MEQ tablet TAKE 1 TABLET BY MOUTH EVERY DAY 90 tablet 3   rosuvastatin (CRESTOR) 20 MG tablet Take 1 tablet (20 mg total) by mouth daily. 90 tablet 3   tirzepatide (MOUNJARO) 7.5 MG/0.5ML Pen Inject 7.5 mg into the skin once a week. 6 mL 3   traZODone (DESYREL) 50 MG tablet Take 1 tablet (50 mg total) by mouth at bedtime. 90 tablet 3   No current facility-administered medications on file prior to visit.        ROS:  All others reviewed and negative.  Objective        PE:  BP 118/70 (BP Location: Left Arm, Patient Position: Sitting, Cuff Size: Normal)   Pulse 92   Temp 98.4 F (36.9 C) (Oral)   Ht 5\' 4"  (1.626 m)   Wt 150 lb (68 kg)   LMP 12/30/2022 (Approximate)   SpO2 99%   BMI 25.75 kg/m                 Constitutional: Pt appears in NAD               HENT: Head: NCAT.  Right Ear: External ear normal.                 Left Ear: External ear normal.                Eyes: . Pupils are equal, round, and reactive to light. Conjunctivae and EOM are normal               Nose: without d/c or deformity               Neck: Neck supple. Gross normal ROM               Cardiovascular: Normal rate and regular rhythm.                 Pulmonary/Chest: Effort normal and breath sounds without rales or wheezing.                Abd:  Soft, NT, ND, + BS, no organomegaly               Neurological: Pt is alert. At baseline orientation, motor grossly intact               Skin: Skin is warm. No rashes, no other new lesions, LE edema - ***               Psychiatric: Pt behavior is normal without agitation   Micro: none  Cardiac tracings I have personally interpreted today:  none  Pertinent Radiological findings (summarize): none   Lab Results  Component Value Date   WBC 9.6 06/01/2022   HGB 13.3 06/01/2022   HCT 39.8 06/01/2022   PLT 327.0 06/01/2022   GLUCOSE 86 06/01/2022   CHOL 139 06/01/2022   TRIG  80.0 06/01/2022   HDL 56.30 06/01/2022   LDLDIRECT 94.0 08/23/2021   LDLCALC 67 06/01/2022   ALT 22 06/01/2022   AST 17 06/01/2022   NA 136 06/01/2022   K 3.6 06/01/2022   CL 101 06/01/2022   CREATININE 0.88 06/01/2022   BUN 20 06/01/2022   CO2 28 06/01/2022   TSH 1.52 06/01/2022   HGBA1C 5.4 06/01/2022   MICROALBUR 7.3 (H) 06/01/2022   Assessment/Plan:  Victoria Ryan is a 42 y.o. Black or African American [2] female with  has a past medical history of Anxiety, Chicken pox, Gestational diabetes, Hypertension, Multiple sclerosis (HCC) (12/2017), and Status post tubal ligation (02/23/2019).  No problem-specific Assessment & Plan notes found for this encounter.  Followup: No follow-ups on file.  Oliver Barre, MD 01/11/2023 3:31 PM [facility]

## 2023-01-11 NOTE — Patient Instructions (Signed)
Ok to decrease the amlodipine to 5 mg per day  Ok to STOP the metformin  Please continue all other medications as before, and refills have been done if requested.  Please have the pharmacy call with any other refills you may need.  Please continue your efforts at being more active, low cholesterol diet, and weight control.  Please keep your appointments with your specialists as you may have planned  Please go to the LAB at the blood drawing area for the tests to be done  You will be contacted by phone if any changes need to be made immediately.  Otherwise, you will receive a letter about your results with an explanation, but please check with MyChart first.  Please make an Appointment to return in 6 months, or sooner if needed

## 2023-01-13 ENCOUNTER — Encounter: Payer: Self-pay | Admitting: Internal Medicine

## 2023-01-13 NOTE — Assessment & Plan Note (Addendum)
Lab Results  Component Value Date   HGBA1C 5.7 01/11/2023   Stable, pt to continue current medical treatment mounjaro 7.5 mg weekly but if losing more wt could consider taking every 10 days, and ok to D/C metformin

## 2023-01-13 NOTE — Assessment & Plan Note (Signed)
Last vitamin D Lab Results  Component Value Date   VD25OH 37.43 06/01/2022   Low, to start oral replacement

## 2023-01-13 NOTE — Assessment & Plan Note (Signed)
Lab Results  Component Value Date   LDLCALC 44 01/11/2023   Stable, pt to continue current statin crestor 20 qd

## 2023-01-13 NOTE — Assessment & Plan Note (Signed)
BP Readings from Last 3 Encounters:  01/11/23 118/70  06/05/22 110/68  12/04/21 120/68   Now low normal with recent marked wt loss, pt to continue medical treatment hct 25 every day and benicar 40 every day, but decreased amlodipine 5 mg qd

## 2023-01-14 ENCOUNTER — Other Ambulatory Visit: Payer: Self-pay | Admitting: Neurology

## 2023-01-14 DIAGNOSIS — G35 Multiple sclerosis: Secondary | ICD-10-CM

## 2023-01-24 ENCOUNTER — Other Ambulatory Visit: Payer: Self-pay | Admitting: Internal Medicine

## 2023-01-25 ENCOUNTER — Other Ambulatory Visit: Payer: Self-pay

## 2023-01-31 ENCOUNTER — Encounter: Payer: Self-pay | Admitting: Internal Medicine

## 2023-02-04 ENCOUNTER — Other Ambulatory Visit (HOSPITAL_COMMUNITY): Payer: Self-pay

## 2023-02-04 ENCOUNTER — Telehealth: Payer: Self-pay | Admitting: Pharmacy Technician

## 2023-02-04 NOTE — Telephone Encounter (Signed)
PA request has been Cancelled. New Encounter created for follow up. For additional info see Pharmacy Prior Auth telephone encounter from 02/04/23.

## 2023-02-04 NOTE — Telephone Encounter (Signed)
Pharmacy Patient Advocate Encounter   Received notification from Patient Advice Request that prior authorization for Mounjaro 7.5 MG/0.5ML Pen is required/requested.   Insurance verification completed.   The patient is insured through [facility] .   Per test claim: The current 28 day co-pay is, $25.00.  No PA needed at this time. This test claim was processed through [facility]- copay amounts may vary at other pharmacies due to pharmacy/plan contracts, or as the patient moves through the different stages of their insurance plan.     **eVoucher applied automatically with patients insurance at [facility]. I called [facility] pharmacy and it cost $524. They may not allow an eVoucher to be applied automatically at that pharmacy. Patient can apply for a coupon card.**

## 2023-02-11 ENCOUNTER — Telehealth: Payer: Self-pay

## 2023-02-11 NOTE — Telephone Encounter (Unsigned)
Copied from CRM [id number]. Topic: Clinical - Prescription Issue >> Feb 11, 2023  1:16 PM Sonny Dandy B wrote: Reason for CRM: Pt called to follow up on prior authorization for [facility]. Pt states pharmacy is requesting a prior auth Pleas call pt back at  [phone]

## 2023-02-14 NOTE — Telephone Encounter (Signed)
Message sent via My chart

## 2023-02-22 MED ORDER — TIRZEPATIDE-WEIGHT MANAGEMENT 2.5 MG/0.5ML ~~LOC~~ SOLN: 2.5000 mg | SUBCUTANEOUS | 11 refills | Status: DC

## 2023-02-22 NOTE — Telephone Encounter (Signed)
 Copied from CRM [id number]. Topic: Clinical - Medication Question >> Feb 22, 2023  2:00 PM Zayanah H wrote: Reason for CRM: want to se if provider can write prescription for zepbound 

## 2023-02-22 NOTE — Telephone Encounter (Signed)
 Ok done erx

## 2023-02-22 NOTE — Addendum Note (Signed)
 Addended by: Roslyn Coombe on: 02/22/2023 04:50 PM   Modules accepted: Orders

## 2023-02-26 ENCOUNTER — Telehealth: Payer: Self-pay

## 2023-02-26 ENCOUNTER — Other Ambulatory Visit (HOSPITAL_COMMUNITY): Payer: Self-pay

## 2023-02-26 ENCOUNTER — Encounter: Payer: Self-pay | Admitting: Internal Medicine

## 2023-02-26 NOTE — Telephone Encounter (Signed)
Pharmacy Patient Advocate Encounter   Received notification from CoverMyMeds that prior authorization for Zepbound 2.5MG /0.5ML pen-injectors is required/requested.   Insurance verification completed.   The patient is insured through [facility] .   Per test claim: PA required; PA submitted to above mentioned insurance via CoverMyMeds Key/confirmation #/EOC [id number] Status is pending

## 2023-02-27 ENCOUNTER — Other Ambulatory Visit: Payer: Self-pay

## 2023-02-27 MED ORDER — TIRZEPATIDE-WEIGHT MANAGEMENT 2.5 MG/0.5ML ~~LOC~~ SOLN: 2.5000 mg | SUBCUTANEOUS | 11 refills | Status: DC

## 2023-03-08 ENCOUNTER — Encounter: Payer: Self-pay | Admitting: Neurology

## 2023-03-08 ENCOUNTER — Ambulatory Visit
Admission: RE | Admit: 2023-03-08 | Discharge: 2023-03-08 | Disposition: A | Discharge status: Home / Self Care | Payer: No Typology Code available for payment source | Source: Ambulatory Visit | Attending: Neurology | Admitting: Neurology

## 2023-03-08 ENCOUNTER — Encounter: Payer: Self-pay | Admitting: Internal Medicine

## 2023-03-08 ENCOUNTER — Ambulatory Visit: Payer: Self-pay | Admitting: Internal Medicine

## 2023-03-08 ENCOUNTER — Telehealth: Payer: Self-pay | Admitting: Neurology

## 2023-03-08 DIAGNOSIS — G35 Multiple sclerosis: Secondary | ICD-10-CM

## 2023-03-08 MED ORDER — GADOTERIDOL 279.3 MG/ML IV SOLN
18.0000 mL | Freq: Once | INTRAVENOUS | Status: AC | PRN
  Administered 2023-03-08: 18 mL via INTRAVENOUS

## 2023-03-08 NOTE — Telephone Encounter (Signed)
 This RN made first attempt to contact patient. No answer so a voicemail was left with call back number provided.  Copied From CRM [id number]. Reason for Triage: blood pressure has been elevated

## 2023-03-08 NOTE — Telephone Encounter (Signed)
 Copied from CRM [id number]. Topic: Clinical - Prescription Issue >> Mar 08, 2023  1:51 PM Suzette B wrote: Reason for CRM: patient is calling in regards to the PA for Zepbound, she stated her that she has no diabetes medication and due to no diabetes medication her blood pressure will go up, advised patient via not in chart PA is currently pending, she asked how could she speed this up, advised to call insurance company to see if there's anything else needed.

## 2023-03-08 NOTE — Telephone Encounter (Signed)
 Victoria Ryan comes in today for a CHIMES MS study visit week 192.   She is on ocrelizumab 600 mg dose IV q 6 months.  She has tolerated the medication well and has not had any difficulties with her infusions. This is her last for the study and she will be rolling over to commercial drug.  Next infusion will be in about 2 weeks in the main office/[facility]      She feels her MS has been stable and she denies any exacerbation or new symptoms.  MS is having negligible impact on her life.    Today, the EDSS was 2.0 (two point for visual, one point cerebellar).  General examination was normal

## 2023-03-08 NOTE — Telephone Encounter (Signed)
  Chief Complaint: Hypertension Symptoms: high blood pressure Frequency: today blood pressure was taken Pertinent Negatives: Patient denies blurred vision, chest pain, difficulty breathing, headache, weakness Disposition: [] ED /[] Urgent Care (no appt availability in office) / [x] Appointment(In office/virtual)/ []  [facility]/ [] Home Care/ [] Refused Recommended Disposition /[] [place] [facility]/ []  Follow-up with PCP Additional Notes: Patient called and advised that Patient states that she was waiting on a pre authorization for Zepbound and that was denied. She states that she have been off of any kind of diabetes medication for 3 weeks now and she believes this is making her blood pressure go up.   Patient states her blood pressure today at her neurologist was 150/47. Patient denies any blurred vision, chest pain, difficulty breathing, headache.  Patient states that with her blood pressure going up, her neurologist wanted her to get in touch with her PCP about getting either getting another preauthorization for the Zepbound medication or to get a different kind of the same type of medication because this could be causing her blood pressure to go up.   Patient states that her blood pressure was fine before not being able to continue her diabetes medication and having to go through these insurance issues. Appointment is made for Friday 03/14/2022 at 10:40 am with patient's PCP.  Patient is also advised that if anything worsens to go the emergency room or if anything changes or she needs to change the appointment to call us back.  Patient verbalized understanding.   Reason for Disposition  [1] Systolic BP  >= 130 OR Diastolic >= 80 AND [2] taking BP medications  Answer Assessment - Initial Assessment Questions 1. BLOOD PRESSURE: "What is the blood pressure?" "Did you take at least two measurements 5 minutes apart?"     One reading 2. ONSET: "When did you take your blood pressure?"      At doctor appt today 3. HOW: "How did you take your blood pressure?" (e.g., automatic home BP monitor, visiting nurse)     Doctor appt today 4. HISTORY: "Do you have a history of high blood pressure?"     yes 5. MEDICINES: "Are you taking any medicines for blood pressure?" "Have you missed any doses recently?"     yes 6. OTHER SYMPTOMS: "Do you have any symptoms?" (e.g., blurred vision, chest pain, difficulty breathing, headache, weakness)     No 7. PREGNANCY: "Is there any chance you are pregnant?" "When was your last menstrual period?"     No--negative test today  Protocols used: Blood Pressure - High-A-AH

## 2023-03-12 NOTE — Telephone Encounter (Signed)
 Pharmacy Patient Advocate Encounter  Received notification from [facility] that Prior Authorization for Zepbound 2.5MG /0.5ML pen-injectors  has been DENIED.  See denial reason below. No denial letter attached in CMM. Will attach denial letter to Media tab once received.   PA #/Case ID/Reference #: [id number]

## 2023-03-13 NOTE — Telephone Encounter (Signed)
 PA has been denied.

## 2023-03-15 ENCOUNTER — Ambulatory Visit: Payer: Self-pay | Admitting: Internal Medicine

## 2023-03-28 NOTE — Telephone Encounter (Signed)
 Per [place], pt r/s to 04/10/23

## 2023-03-28 NOTE — Telephone Encounter (Signed)
 Spoke w/ [facility]. They have pt scheduled for 06/05/23 because pt reported her last infusion was 11/2022. However, we have documented last infusion 10/02/22. Jeanice Lim will call pt and get her r/s to this month.

## 2023-05-18 ENCOUNTER — Other Ambulatory Visit: Payer: Self-pay | Admitting: Internal Medicine

## 2023-05-20 ENCOUNTER — Other Ambulatory Visit: Payer: Self-pay

## 2023-07-12 ENCOUNTER — Ambulatory Visit: Payer: BC Managed Care – PPO | Admitting: Internal Medicine

## 2023-07-12 LAB — HM MAMMOGRAPHY

## 2023-07-16 ENCOUNTER — Ambulatory Visit: Admitting: Internal Medicine

## 2023-07-16 VITALS — Ht 64.0 in

## 2023-07-17 ENCOUNTER — Ambulatory Visit (INDEPENDENT_AMBULATORY_CARE_PROVIDER_SITE_OTHER): Admitting: Internal Medicine

## 2023-07-17 ENCOUNTER — Encounter: Payer: Self-pay | Admitting: Internal Medicine

## 2023-07-17 ENCOUNTER — Telehealth: Payer: Self-pay

## 2023-07-17 ENCOUNTER — Ambulatory Visit: Payer: Self-pay | Admitting: Internal Medicine

## 2023-07-17 VITALS — BP 120/74 | Temp 98.6°F | Ht 64.0 in | Wt 169.0 lb

## 2023-07-17 DIAGNOSIS — E559 Vitamin D deficiency, unspecified: Secondary | ICD-10-CM

## 2023-07-17 DIAGNOSIS — E538 Deficiency of other specified B group vitamins: Secondary | ICD-10-CM

## 2023-07-17 DIAGNOSIS — I10 Essential (primary) hypertension: Secondary | ICD-10-CM

## 2023-07-17 DIAGNOSIS — E1165 Type 2 diabetes mellitus with hyperglycemia: Secondary | ICD-10-CM

## 2023-07-17 DIAGNOSIS — G35 Multiple sclerosis: Secondary | ICD-10-CM

## 2023-07-17 DIAGNOSIS — E7800 Pure hypercholesterolemia, unspecified: Secondary | ICD-10-CM | POA: Diagnosis not present

## 2023-07-17 DIAGNOSIS — Z0000 Encounter for general adult medical examination without abnormal findings: Secondary | ICD-10-CM

## 2023-07-17 DIAGNOSIS — Z0001 Encounter for general adult medical examination with abnormal findings: Secondary | ICD-10-CM

## 2023-07-17 LAB — MICROALBUMIN / CREATININE URINE RATIO
Creatinine,U: 34.4 mg/dL
Microalb Creat Ratio: 76.5 mg/g — ABNORMAL HIGH (ref 0.0–30.0)
Microalb, Ur: 2.6 mg/dL — ABNORMAL HIGH (ref 0.0–1.9)

## 2023-07-17 LAB — CBC WITH DIFFERENTIAL/PLATELET
Basophils Absolute: 0 10*3/uL (ref 0.0–0.1)
Basophils Relative: 0.4 % (ref 0.0–3.0)
Eosinophils Absolute: 0.1 10*3/uL (ref 0.0–0.7)
Eosinophils Relative: 0.8 % (ref 0.0–5.0)
HCT: 40.5 % (ref 36.0–46.0)
Hemoglobin: 13.7 g/dL (ref 12.0–15.0)
Lymphocytes Relative: 25.8 % (ref 12.0–46.0)
Lymphs Abs: 2.1 10*3/uL (ref 0.7–4.0)
MCHC: 33.7 g/dL (ref 30.0–36.0)
MCV: 91.7 fl (ref 78.0–100.0)
Monocytes Absolute: 0.7 10*3/uL (ref 0.1–1.0)
Monocytes Relative: 8.9 % (ref 3.0–12.0)
Neutro Abs: 5.1 10*3/uL (ref 1.4–7.7)
Neutrophils Relative %: 64.1 % (ref 43.0–77.0)
Platelets: 293 10*3/uL (ref 150.0–400.0)
RBC: 4.42 Mil/uL (ref 3.87–5.11)
RDW: 13.7 % (ref 11.5–15.5)
WBC: 8 10*3/uL (ref 4.0–10.5)

## 2023-07-17 LAB — BASIC METABOLIC PANEL WITH GFR
BUN: 19 mg/dL (ref 6–23)
CO2: 26 meq/L (ref 19–32)
Calcium: 9.6 mg/dL (ref 8.4–10.5)
Chloride: 101 meq/L (ref 96–112)
Creatinine, Ser: 0.8 mg/dL (ref 0.40–1.20)
GFR: 90.29 mL/min (ref >60.00)
Glucose, Bld: 114 mg/dL — ABNORMAL HIGH (ref 70–99)
Potassium: 3.6 meq/L (ref 3.5–5.1)
Sodium: 136 meq/L (ref 135–145)

## 2023-07-17 LAB — LIPID PANEL
Cholesterol: 125 mg/dL (ref 0–200)
HDL: 56.9 mg/dL (ref >39.00)
LDL Cholesterol: 55 mg/dL (ref 0–99)
NonHDL: 68.09
Total CHOL/HDL Ratio: 2
Triglycerides: 66 mg/dL (ref 0.0–149.0)
VLDL: 13.2 mg/dL (ref 0.0–40.0)

## 2023-07-17 LAB — HEPATIC FUNCTION PANEL
ALT: 81 U/L — ABNORMAL HIGH (ref 0–35)
AST: 51 U/L — ABNORMAL HIGH (ref 0–37)
Albumin: 4.8 g/dL (ref 3.5–5.2)
Alkaline Phosphatase: 68 U/L (ref 39–117)
Bilirubin, Direct: 0.1 mg/dL (ref 0.0–0.3)
Total Bilirubin: 0.5 mg/dL (ref 0.2–1.2)
Total Protein: 7.6 g/dL (ref 6.0–8.3)

## 2023-07-17 LAB — URINALYSIS, ROUTINE W REFLEX MICROSCOPIC
Bilirubin Urine: NEGATIVE
Hgb urine dipstick: NEGATIVE
Ketones, ur: NEGATIVE
Nitrite: NEGATIVE
Specific Gravity, Urine: <=1.005 — AB (ref 1.000–1.030)
Total Protein, Urine: NEGATIVE
Urine Glucose: NEGATIVE
Urobilinogen, UA: 0.2 (ref 0.0–1.0)
pH: 7 (ref 5.0–8.0)

## 2023-07-17 LAB — VITAMIN B12: Vitamin B-12: 531 pg/mL (ref 211–911)

## 2023-07-17 LAB — TSH: TSH: 2.18 u[IU]/mL (ref 0.35–5.50)

## 2023-07-17 LAB — VITAMIN D 25 HYDROXY (VIT D DEFICIENCY, FRACTURES): VITD: 46.02 ng/mL (ref 30.00–100.00)

## 2023-07-17 LAB — HEMOGLOBIN A1C: Hgb A1c MFr Bld: 5.7 % (ref 4.6–6.5)

## 2023-07-17 MED ORDER — FREESTYLE LIBRE 3 READER DEVI: 0 refills | Status: AC

## 2023-07-17 MED ORDER — TIRZEPATIDE 2.5 MG/0.5ML ~~LOC~~ SOAJ
2.5000 mg | SUBCUTANEOUS | 11 refills | Status: DC
Start: 2023-07-17 — End: 2023-11-15

## 2023-07-17 MED ORDER — FREESTYLE LIBRE 3 PLUS SENSOR MISC: 3 refills | Status: AC

## 2023-07-17 NOTE — Assessment & Plan Note (Signed)
 Lab Results  Component Value Date   LDLCALC 44 01/11/2023   Stable, pt to continue current statin crestor  20 every day, f/u lab today

## 2023-07-17 NOTE — Assessment & Plan Note (Signed)
 Lab Results  Component Value Date   HGBA1C 5.7 01/11/2023   Stable last visit, but forced to be off the mounjaro  due to insurance stopped covering, likely worsening again with wt gain, pt now for f/u A1c, also restart mounjaro  2.5 mg weekly with intent to titrate monthly to effect

## 2023-07-17 NOTE — Progress Notes (Signed)
 Patient ID: Victoria Ryan, female   DOB: [birth year], 43 y.o.   MRN: [id number]         Chief Complaint:: wellness exam and dm, htn, hld, low vit d and b12       HPI:  Victoria Ryan is a 43 y.o. female here for wellness exam; pt plans to see optho herself; delines hpv and hep b, also has just recently completed GYN pap and mammogram last month, o/w up to date         l               Also Pt denies chest pain, increased sob or doe, wheezing, orthopnea, PND, increased LE swelling, palpitations, dizziness or syncope.   Pt denies polydipsia, polyuria, or new focal neuro s/s.    Pt denies fever, wt loss, night sweats, loss of appetite, or other constitutional symptoms    Willing to retry the mounjaro  as her wt has increased and beieves the A1c is likely higher again.  Has been > 13 1 yr ago, improved with mounjaro  and wt loss, then insurance declined to cover after jan 2025 since her A1c was so good.  MS has been stable by recent Brain MRI.    Wt Readings from Last 3 Encounters:  07/17/23 169 lb (76.7 kg)  01/11/23 150 lb (68 kg)  06/05/22 150 lb (68 kg)   BP Readings from Last 3 Encounters:  07/17/23 120/74  01/11/23 118/70  06/05/22 110/68   Immunization History  Administered Date(s) Administered   Influenza, Seasonal, Injecte, Preservative Fre 01/11/2023   Influenza,inj,Quad PF,6+ Mos 12/23/2017   Influenza-Unspecified 11/12/2014, 09/25/2018   PFIZER(Purple Top)SARS-COV-2 Vaccination 04/09/2019, 05/04/2019   PNEUMOCOCCAL CONJUGATE-20 08/18/2020   Tdap 06/13/2012, 12/22/2018   Health Maintenance Due  Topic Date Due   Diabetic kidney evaluation - Urine ACR  Never done   Hepatitis B Vaccines (1 of 3 - 19+ 3-dose series) Never done   HPV VACCINES (1 - 3-dose SCDM series) Never done   OPHTHALMOLOGY EXAM  03/13/2022   Cervical Cancer Screening (HPV/Pap Cotest)  07/17/2022   HEMOGLOBIN A1C  07/12/2023      Past Medical History:  Diagnosis Date   Anxiety    Chicken pox     Gestational diabetes    takes insulin  & metformin    Hypertension    take labetalol    Multiple sclerosis (HCC) 12/2017   Status post tubal ligation 02/23/2019   Past Surgical History:  Procedure Laterality Date   CESAREAN SECTION     x2   CESAREAN SECTION WITH BILATERAL TUBAL LIGATION Bilateral 02/23/2019   Procedure: CESAREAN SECTION WITH BILATERAL TUBAL LIGATION;  Miller: Delana Ted Morrison, DO;  Location: [facility];  Service: Obstetrics;  Laterality: Bilateral;    reports that she has quit smoking. Her smoking use included cigars. She has never used smokeless tobacco. She reports current alcohol use. She reports that she does not currently use drugs. family history includes Breast cancer in her paternal grandmother; Heart disease in her father; Hyperlipidemia in her father; Hypertension in her father and mother. Allergies  Allergen Reactions   Cats Claw (Uncaria Tomentosa)    Current Outpatient Medications on File Prior to Visit  Medication Sig Dispense Refill   amLODipine  (NORVASC ) 5 MG tablet Take 1 tablet (5 mg total) by mouth daily. 90 tablet 3   hydrochlorothiazide  (HYDRODIURIL ) 25 MG tablet TAKE 1 TABLET (25 MG TOTAL) BY MOUTH DAILY. 90 tablet 3   ocrelizumab  (OCREVUS ) 300 MG/10ML  injection Inject 20 mLs (600 mg total) into the vein every 6 (six) months. 20 mL 5   olmesartan  (BENICAR ) 40 MG tablet TAKE 1 TABLET BY MOUTH EVERY DAY 90 tablet 3   potassium chloride  (KLOR-CON ) 10 MEQ tablet TAKE 1 TABLET BY MOUTH EVERY DAY 90 tablet 3   rosuvastatin  (CRESTOR ) 20 MG tablet TAKE 1 TABLET BY MOUTH EVERY DAY 90 tablet 3   traZODone  (DESYREL ) 50 MG tablet Take 1 tablet (50 mg total) by mouth at bedtime. 90 tablet 3   No current facility-administered medications on file prior to visit.        ROS:  All others reviewed and negative.  Objective        PE:  BP 120/74 (BP Location: Right Arm, Patient Position: Sitting, Cuff Size: Normal)   Temp 98.6 F (37 C) (Oral)   Ht 5' 4 (1.626  m)   Wt 169 lb (76.7 kg)   LMP 06/22/2023 (Approximate)   BMI 29.01 kg/m                 Constitutional: Pt appears in NAD               HENT: Head: NCAT.                Right Ear: External ear normal.                 Left Ear: External ear normal.                Eyes: . Pupils are equal, round, and reactive to light. Conjunctivae and EOM are normal               Nose: without d/c or deformity               Neck: Neck supple. Gross normal ROM               Cardiovascular: Normal rate and regular rhythm.                 Pulmonary/Chest: Effort normal and breath sounds without rales or wheezing.                Abd:  Soft, NT, ND, + BS, no organomegaly               Neurological: Pt is alert. At baseline orientation, motor grossly intact               Skin: Skin is warm. No rashes, no other new lesions, LE edema - none               Psychiatric: Pt behavior is normal without agitation   Micro: none  Cardiac tracings I have personally interpreted today:  none  Pertinent Radiological findings (summarize): none   Lab Results  Component Value Date   WBC 9.6 06/01/2022   HGB 13.3 06/01/2022   HCT 39.8 06/01/2022   PLT 327.0 06/01/2022   GLUCOSE 89 01/11/2023   CHOL 125 01/11/2023   TRIG 73.0 01/11/2023   HDL 66.30 01/11/2023   LDLDIRECT 94.0 08/23/2021   LDLCALC 44 01/11/2023   ALT 57 (H) 01/11/2023   AST 27 01/11/2023   NA 138 01/11/2023   K 3.6 01/11/2023   CL 101 01/11/2023   CREATININE 0.77 01/11/2023   BUN 22 01/11/2023   CO2 28 01/11/2023   TSH 1.52 06/01/2022   HGBA1C 5.7 01/11/2023   Assessment/Plan:  Victoria Ryan is a 43  y.o. [place] American [2] female with  has a past medical history of Anxiety, Chicken pox, Gestational diabetes, Hypertension, Multiple sclerosis (HCC) (12/2017), and Status post tubal ligation (02/23/2019).  Encounter for well adult exam with abnormal findings Age and sex appropriate education and counseling updated with regular  exercise and diet Referrals for preventative services - pt will call for optho appt Immunizations addressed - declines hpv or hep b Smoking counseling  - none needed Evidence for depression or other mood disorder - none significant Most recent labs reviewed. I have personally reviewed and have noted: 1) the patient's medical and social history 2) The patient's current medications and supplements 3) The patient's height, weight, and BMI have been recorded in the chart   Multiple sclerosis (HCC) Stable, cont f/u with neurology and tx with ocrevus   Essential hypertension, benign BP Readings from Last 3 Encounters:  07/17/23 120/74  01/11/23 118/70  06/05/22 110/68   Stable, pt to continue medical treatment norvasc  5 every day, hct 25 every day, benicar  40 qd   Hyperlipidemia Lab Results  Component Value Date   LDLCALC 44 01/11/2023   Stable, pt to continue current statin crestor  20 every day, f/u lab today   Diabetes (HCC) Lab Results  Component Value Date   HGBA1C 5.7 01/11/2023   Stable last visit, but forced to be off the mounjaro  due to insurance stopped covering, likely worsening again with wt gain, pt now for f/u A1c, also restart mounjaro  2.5 mg weekly with intent to titrate monthly to effect   Vitamin D  deficiency Last vitamin D  Lab Results  Component Value Date   VD25OH 37.43 06/01/2022   Low, to start oral replacement   B12 deficiency Lab Results  Component Value Date   VITAMINB12 978 (H) 06/01/2022   Stable, cont oral replacement - b12 1000 mcg qd  Followup: Return in about 6 months (around 01/17/2024).  Lynwood Rush, MD 07/17/2023 8:54 AM [facility]

## 2023-07-17 NOTE — Progress Notes (Signed)
 The test results show that your current treatment is OK, as the tests are stable.  Please continue the same plan.  There is no other need for change of treatment or further evaluation based on these results, at this time.  thanks

## 2023-07-17 NOTE — Telephone Encounter (Signed)
 Pharmacy Patient Advocate Encounter   Received notification from [employer] that prior authorization for Mounjaro  2.5MG /0.5ML auto-injectors is required/requested.   Insurance verification completed.   The patient is insured through [facility] .   Per test claim: PA required; PA submitted to above mentioned insurance via CoverMyMeds Key/confirmation #/EOC [id number] Status is pending

## 2023-07-17 NOTE — Assessment & Plan Note (Signed)
 BP Readings from Last 3 Encounters:  07/17/23 120/74  01/11/23 118/70  06/05/22 110/68   Stable, pt to continue medical treatment norvasc  5 every day, hct 25 every day, benicar  40 qd

## 2023-07-17 NOTE — Assessment & Plan Note (Signed)
Lab Results  Component Value Date   VITAMINB12 978 (H) 06/01/2022   Stable, cont oral replacement - b12 1000 mcg qd

## 2023-07-17 NOTE — Assessment & Plan Note (Signed)
 Age and sex appropriate education and counseling updated with regular exercise and diet Referrals for preventative services - pt will call for optho appt Immunizations addressed - declines hpv or hep b Smoking counseling  - none needed Evidence for depression or other mood disorder - none significant Most recent labs reviewed. I have personally reviewed and have noted: 1) the patient's medical and social history 2) The patient's current medications and supplements 3) The patient's height, weight, and BMI have been recorded in the chart

## 2023-07-17 NOTE — Patient Instructions (Signed)
 Please take all new medication as prescribed - the mounjaro  2.5 mg weekly, but let us  know every month if you would want the next higher dose  We have sent the [facility] system to the pharmacy  Please remember to see your eye doctor at least once per yr  Please continue all other medications as before, and refills have been done if requested.  Please have the pharmacy call with any other refills you may need.  Please continue your efforts at being more active, low cholesterol diet, and weight control.  You are otherwise up to date with prevention measures today.  Please keep your appointments with your specialists as you may have planned  Please go to the LAB at the blood drawing area for the tests to be done  You will be contacted by phone if any changes need to be made immediately.  Otherwise, you will receive a letter about your results with an explanation, but please check with MyChart first.  Please make an Appointment to return in 6 months, or sooner if needed

## 2023-07-17 NOTE — Assessment & Plan Note (Signed)
 Stable, cont f/u with neurology and tx with ocrevus 

## 2023-07-17 NOTE — Assessment & Plan Note (Signed)
Last vitamin D Lab Results  Component Value Date   VD25OH 37.43 06/01/2022   Low, to start oral replacement

## 2023-07-17 NOTE — Progress Notes (Signed)
 Pt not seen.

## 2023-07-22 ENCOUNTER — Other Ambulatory Visit (HOSPITAL_COMMUNITY): Payer: Self-pay

## 2023-07-22 NOTE — Telephone Encounter (Signed)
 Pharmacy Patient Advocate Encounter  Received notification from [employer] that Prior Authorization for Mounjaro  2.5MG /0.5ML auto-injectors has been APPROVED from 07/17/2023 to 07/16/2024. Ran test claim, Copay is $0. This test claim was processed through [facility]- copay amounts may vary at other pharmacies due to pharmacy/plan contracts, or as the patient moves through the different stages of their insurance plan.   PA #/Case ID/Reference #: [id number]

## 2023-09-10 ENCOUNTER — Telehealth: Payer: Self-pay | Admitting: *Deleted

## 2023-09-10 NOTE — Telephone Encounter (Signed)
 Per [facility], pt needs updated visit/labs before next Ocrevus  infusion scheduled with them on 10/09/23.  Called pt at [phone]. LVM asking she call office back before 5pm today.   Phone room: if pt calls, please relay above and offer appt with Dr. Vear 09/11/23 at 2:30pm that I have on hold. She needs to come in now to allow time for infusion suite to get approval through insurance before next scheduled infusion. This process can take 2-3 wk.

## 2023-09-10 NOTE — Telephone Encounter (Signed)
 Noted

## 2023-09-10 NOTE — Telephone Encounter (Signed)
 Pt called back and scheduled held appt

## 2023-09-11 ENCOUNTER — Encounter: Payer: Self-pay | Admitting: Neurology

## 2023-09-11 ENCOUNTER — Ambulatory Visit (INDEPENDENT_AMBULATORY_CARE_PROVIDER_SITE_OTHER): Admitting: Neurology

## 2023-09-11 VITALS — BP 131/87 | HR 120 | Ht 64.0 in | Wt 170.5 lb

## 2023-09-11 DIAGNOSIS — G35D Multiple sclerosis, unspecified: Secondary | ICD-10-CM

## 2023-09-11 DIAGNOSIS — Z7985 Long-term (current) use of injectable non-insulin antidiabetic drugs: Secondary | ICD-10-CM

## 2023-09-11 DIAGNOSIS — Z79899 Other long term (current) drug therapy: Secondary | ICD-10-CM | POA: Diagnosis not present

## 2023-09-11 DIAGNOSIS — G4721 Circadian rhythm sleep disorder, delayed sleep phase type: Secondary | ICD-10-CM

## 2023-09-11 DIAGNOSIS — G4700 Insomnia, unspecified: Secondary | ICD-10-CM | POA: Diagnosis not present

## 2023-09-11 DIAGNOSIS — G35 Multiple sclerosis: Secondary | ICD-10-CM

## 2023-09-11 DIAGNOSIS — E1165 Type 2 diabetes mellitus with hyperglycemia: Secondary | ICD-10-CM | POA: Diagnosis not present

## 2023-09-11 DIAGNOSIS — G35A Relapsing-remitting multiple sclerosis: Secondary | ICD-10-CM

## 2023-09-11 NOTE — Progress Notes (Addendum)
 "  [facility]  PATIENT: Victoria Ryan DOB: October 12, 1980  REFERRING DOCTOR OR PCP: Lynwood Rush, MD  _________________________________   HISTORICAL  CHIEF COMPLAINT:  Chief Complaint  Patient presents with   Follow-up    Pt in room 11. Alone.Here for MS follow up. DMT: Ocrevus , Last infusion date: 04/10/2023 Next infusion date: 10/09/2023. No falls, last eye exam was last year. No new concerns.     HISTORY OF PRESENT ILLNESS:  Victoria Ryan is a 43 y.o. 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  Update 09/11/2023  She is on Ocrevus  and next infusion is September 24. 2025.   Victoria Ryan  She denies exacerbations or new neurologic symptoms.  In the past with gait have mostly resolved.  Balance is still slightly off when she does a tandem walk.  She can go up and down stairs well. Does not need the bannister.   She keeps up with others for longer walks.  She has no numbness or dysesthesias..  She also denies weakness.    Bladder function is fine.   Vsion is unchanged.     Faitigue is usuall not much of a problem.   Sleep is variable (has 43 yo with autism).   She has insomnia and delayed phase sleep and up to 2 am many nights.  She needs to wake up at 630 so often just gets 4 1/2 hours on weekdays and 8-9 hours on a weekend .     We went over her strategy will she will try to advance her bedtime from 2 AM to 11 PM 15 minutes at night (alternatively advance 2 hours a day until at midnight).   She will take melatonin 60 to 90 minutes before her planned bedtime    MS HIstory: She inoted numbness on the right laeg and arm Monday 12/24/2017.   She was under a lot of stress and noted anxiety.   She saw her PCP and was told that symptoms would likely improve after af few days off.     She also noted her voice was mildly slurred and the left face was num (also milder right face).   She did not note diplopia.   She had no problems with eating or swallowing.   She noted  that her handwriting and typing were off.     When symptoms persisted she had a brain MRI 12/30/2017.  I reviewed the report and personally reviewed the images.  The brain MRI showed a large focus in the left pons and a smaller subcortical focus in the left parietal lobe.  Additionally there appeared to be a small periventricular focus on the left.  She had a couple of more nonspecific appearing T2/flair hyperintense foci in the subcortical or deep white matter of the hemispheres.  She also noted some difficulty with her right vision.   She has mildly reduced acuity and altered color vision out of that eye.    She started Vumerity but stopped in 2020 for pregnancy.   After the pregnancy she started Ocrevus .    IMAGING: MRI of the brain 04/19/2022 showed no new lesions.  MRI of the brain 05/12/2021 showed no new lesions.  MRI Brain 06/12/2020 showed multiple T2/FLAIR hyperintense foci in the brainstem, right middle cerebellar peduncle, thalamus and hemispheres consistent with chronic demyelinating plaque associated with multiple sclerosis.  None of the foci enhance.  Compared to the MRI dated 12/17/2019, there are no new lesions  MRI Brain 01/26/2018 showed large left  pontine focus previously seen on MRI from 12/30/2017 is noted to have incomplete peripheral enhancement on this contrasted study.  This is consistent with a subacute demyelinating focus.   This pattern combined with the 6 other foci observed in the supratentorial and left thalamic white matter is consistent with multiple sclerosis  MRI cervical and thoracic spine 01/26/2018 and 01/29/2018 showed normal spinal cord   REVIEW OF SYSTEMS: Constitutional: No fevers, chills, sweats, or change in appetite.  She has insomnia. Eyes: No visual changes, double vision, eye pain Ear, nose and throat: No hearing loss, ear pain, nasal congestion, sore throat Cardiovascular: No chest pain, palpitations Respiratory:  No shortness of breath at rest or with  exertion.   No wheezes GastrointestinaI: No nausea, vomiting, diarrhea, abdominal pain, fecal incontinence Genitourinary: Urinary frequency and urgency with occasional urge incontinence.   Musculoskeletal:  No neck pain, back pain Integumentary: No rash, pruritus, skin lesions Neurological: as above Psychiatric: No depression at this time.  No anxiety Endocrine: No palpitations, diaphoresis, change in appetite, change in weigh or increased thirst Hematologic/Lymphatic:  No anemia, purpura, petechiae. Allergic/Immunologic: No itchy/runny eyes, nasal congestion, recent allergic reactions, rashes  ALLERGIES: Allergies  Allergen Reactions   Cats Claw (Uncaria Tomentosa)     HOME MEDICATIONS:  Current Outpatient Medications:    ocrelizumab  (OCREVUS ) 300 MG/10ML injection, Inject 20 mLs (600 mg total) into the vein every 6 (six) months., Disp: 20 mL, Rfl: 5   amLODipine  (NORVASC ) 5 MG tablet, Take 1 tablet (5 mg total) by mouth daily., Disp: 90 tablet, Rfl: 3   Continuous Glucose Receiver (FREESTYLE LIBRE 3 READER) DEVI, Use as directed twice per day  E11.9 (Patient not taking: Reported on 09/11/2023), Disp: 1 each, Rfl: 0   Continuous Glucose Sensor (FREESTYLE LIBRE 3 PLUS SENSOR) MISC, Change sensor every 15 days. E11.9 (Patient not taking: Reported on 09/11/2023), Disp: 6 each, Rfl: 3   hydrochlorothiazide  (HYDRODIURIL ) 25 MG tablet, Take 1 tablet (25 mg total) by mouth daily., Disp: 90 tablet, Rfl: 3   olmesartan  (BENICAR ) 40 MG tablet, Take 1 tablet (40 mg total) by mouth daily., Disp: 90 tablet, Rfl: 3   potassium chloride  (KLOR-CON ) 10 MEQ tablet, Take 1 tablet (10 mEq total) by mouth daily., Disp: 90 tablet, Rfl: 3   rosuvastatin  (CRESTOR ) 20 MG tablet, Take 1 tablet (20 mg total) by mouth daily., Disp: 90 tablet, Rfl: 3   tirzepatide  (MOUNJARO ) 7.5 MG/0.5ML Pen, Inject 7.5 mg into the skin once a week. E11.65, Disp: 6 mL, Rfl: 3   traZODone  (DESYREL ) 50 MG tablet, Take 1 tablet (50 mg  total) by mouth at bedtime as needed for sleep., Disp: 90 tablet, Rfl: 1  PAST MEDICAL HISTORY: Past Medical History:  Diagnosis Date   Anxiety    Chicken pox    Gestational diabetes    takes insulin  & metformin    Hypertension    take labetalol    Multiple sclerosis 12/2017   Status post tubal ligation 02/23/2019    PAST SURGICAL HISTORY: Past Surgical History:  Procedure Laterality Date   CESAREAN SECTION     x2   CESAREAN SECTION WITH BILATERAL TUBAL LIGATION Bilateral 02/23/2019   Procedure: CESAREAN SECTION WITH BILATERAL TUBAL LIGATION;  Astle: Delana Ted Morrison, DO;  Location: [facility];  Service: Obstetrics;  Laterality: Bilateral;    FAMILY HISTORY: Family History  Problem Relation Age of Onset   Hypertension Mother    Heart disease Father    Hyperlipidemia Father    Hypertension Father  Breast cancer Paternal Grandmother    Cancer Neg Hx     SOCIAL HISTORY:  Social History   Socioeconomic History   Marital status: Married    Spouse name: Ozell Macadam   Number of children: 2   Years of education: 14   Highest education level: Some college, no degree  Occupational History   Occupation: Clinical Biochemist Rep  Tobacco Use   Smoking status: Former    Types: Cigars   Smokeless tobacco: Never  Vaping Use   Vaping status: Never Used  Substance and Sexual Activity   Alcohol use: Yes    Comment: occasionally wine   Drug use: Not Currently   Sexual activity: Yes    Birth control/protection: None  Other Topics Concern   Not on file  Social History Narrative   Lives w/ husband and kids   Regular exercise-yes   Caffeine Use-yes   Right handed    Social Drivers of Health   Tobacco Use: Medium Risk (02/01/2024)   Patient History    Smoking Tobacco Use: Former    Smokeless Tobacco Use: Never    Passive Exposure: Not on Actuary Strain: Low Risk (01/16/2024)   Overall Financial Resource Strain (CARDIA)    Difficulty of Paying  Living Expenses: Not hard at all  Food Insecurity: No Food Insecurity (01/16/2024)   Epic    Worried About Programme Researcher, Broadcasting/film/video in the Last Year: Never true    Ran Out of Food in the Last Year: Never true  Transportation Needs: No Transportation Needs (01/16/2024)   Epic    Lack of Transportation (Medical): No    Lack of Transportation (Non-Medical): No  Physical Activity: Insufficiently Active (01/16/2024)   Exercise Vital Sign    Days of Exercise per Week: 3 days    Minutes of Exercise per Session: 10 min  Stress: Stress Concern Present (01/16/2024)   [employer] of Occupational Health - Occupational Stress Questionnaire    Feeling of Stress: Rather much  Social Connections: Moderately Isolated (01/16/2024)   Social Connection and Isolation Panel    Frequency of Communication with Friends and Family: More than three times a week    Frequency of Social Gatherings with Friends and Family: Never    Attends Religious Services: Patient declined    Database Administrator or Organizations: No    Attends Engineer, Structural: Not on file    Marital Status: Married  Catering Manager Violence: Not on file  Depression (PHQ2-9): Low Risk (07/17/2023)   Depression (PHQ2-9)    PHQ-2 Score: 0  Alcohol Screen: Low Risk (01/16/2024)   Alcohol Screen    Last Alcohol Screening Score (AUDIT): 1  Housing: Low Risk (01/16/2024)   Epic    Unable to Pay for Housing in the Last Year: No    Number of Times Moved in the Last Year: 0    Homeless in the Last Year: No  Utilities: Not on file  Health Literacy: Not on file     PHYSICAL EXAM  Vitals:   09/11/23 1437  BP: 131/87  Pulse: (!) 120  Weight: 170 lb 8 oz (77.3 kg)  Height: 5' 4 (1.626 m)     Body mass index is 29.27 kg/m.  Weight is 145 pounds   Height is 5'4  General: The patient is well-developed and well-nourished and in no acute distress   Neurologic Exam  Mental status: The patient is alert and oriented x 3 at the time of  the examination. The patient has apparent normal recent and remote memory, with an apparently normal attention span and concentration ability.   Speech is normal.  Cranial nerves: Extraocular movements are full.    Facial strength is normal.  Trapezius strength is normal. Hearing appears normal. .  Motor: Normal muscle tone and bulk.  Normal strength.    Other: Tinels sign at right wrist   Sensory: She has intact sensation to touch, temperature and vibration.  Coordination: Cerebellar shows good finger-nose-finger and heel-to-shin  Gait and station: Station is normal.   Gait is normal. Tandem gait is wide.  Romberg is negative.  Reflexes: Deep tendon reflexes are symmetric and normal bilaterally.    EDSS 04/20/2022 was 1.5    DIAGNOSTIC DATA (LABS, IMAGING, TESTING) - I reviewed patient records, labs, notes, testing and imaging myself where available.  Lab Results  Component Value Date   WBC 7.7 01/29/2024   HGB 13.3 01/29/2024   HCT 38.4 01/29/2024   MCV 91.2 01/29/2024   PLT 293.0 01/29/2024      Component Value Date/Time   NA 138 01/29/2024 0916   K 3.5 01/29/2024 0916   CL 103 01/29/2024 0916   CO2 28 01/29/2024 0916   GLUCOSE 101 (H) 01/29/2024 0916   BUN 14 01/29/2024 0916   CREATININE 0.74 01/29/2024 0916   CALCIUM  8.9 01/29/2024 0916   PROT 7.0 01/29/2024 0916   PROT 7.3 02/06/2018 1207   ALBUMIN 4.4 01/29/2024 0916   ALBUMIN 4.7 02/06/2018 1207   AST 42 (H) 01/29/2024 0916   ALT 67 (H) 01/29/2024 0916   ALKPHOS 74 01/29/2024 0916   BILITOT 0.3 01/29/2024 0916   BILITOT 0.3 02/06/2018 1207   GFRNONAA >60 02/19/2019 1428   GFRAA >60 02/19/2019 1428   Lab Results  Component Value Date   CHOL 124 01/29/2024   HDL 55.60 01/29/2024   LDLCALC 37 01/29/2024   LDLDIRECT 94.0 08/23/2021   TRIG 156.0 (H) 01/29/2024   CHOLHDL 2 01/29/2024   Lab Results  Component Value Date   HGBA1C 5.6 01/29/2024   Lab Results  Component Value Date   VITAMINB12 531  07/17/2023   Lab Results  Component Value Date   TSH 1.55 01/29/2024       ASSESSMENT AND PLAN    1. Multiple sclerosis, relapsing-remitting   2. Multiple sclerosis   3. High risk medication use   4. Insomnia, unspecified type   5. Type 2 diabetes mellitus with hyperglycemia, without long-term current use of insulin  (HCC)   6. Delayed sleep phase syndrome      1.   Continue Ocrevus .  Her next infusion is scheduled for September 2025.  We will check some lab work today. 2.   Stay active and exercise as tolerated.. 3.   We had a long discussion about strategies to help her delayed phase sleep disorder and insomnia.  She works until 9:30 PM and often does not go to bed until 2 AM.  We discussed about slowly shifting her sleep time to 11 PM so that she will be more in sync with her kids.  She will use melatonin and trazodone  to help. 4.  .Return in 6 months or sooner if there are new or worsening neurologic symptoms.    Grabiel Schmutz A. Vear, MD, PhD, FAAN Certified in Neurology, Clinical Neurophysiology, Sleep Medicine, Pain Medicine and Neuroimaging Director, [facility]  [facility] [address] [place], [place] [address] [phone]   "

## 2023-09-12 ENCOUNTER — Ambulatory Visit: Payer: Self-pay | Admitting: Neurology

## 2023-09-12 LAB — CBC WITH DIFFERENTIAL/PLATELET
Basophils Absolute: 0 x10E3/uL (ref 0.0–0.2)
Basos: 1 %
EOS (ABSOLUTE): 0 x10E3/uL (ref 0.0–0.4)
Eos: 1 %
Hematocrit: 40.7 % (ref 34.0–46.6)
Hemoglobin: 13.8 g/dL (ref 11.1–15.9)
Immature Grans (Abs): 0 x10E3/uL (ref 0.0–0.1)
Immature Granulocytes: 0 %
Lymphocytes Absolute: 1.8 x10E3/uL (ref 0.7–3.1)
Lymphs: 29 %
MCH: 31.5 pg (ref 26.6–33.0)
MCHC: 33.9 g/dL (ref 31.5–35.7)
MCV: 93 fL (ref 79–97)
Monocytes Absolute: 0.3 x10E3/uL (ref 0.1–0.9)
Monocytes: 5 %
Neutrophils Absolute: 3.9 x10E3/uL (ref 1.4–7.0)
Neutrophils: 64 %
Platelets: 281 x10E3/uL (ref 150–450)
RBC: 4.38 x10E6/uL (ref 3.77–5.28)
RDW: 11.8 % (ref 11.7–15.4)
WBC: 6.1 x10E3/uL (ref 3.4–10.8)

## 2023-09-12 LAB — IGG, IGA, IGM
IgA/Immunoglobulin A, Serum: 155 mg/dL (ref 87–352)
IgG (Immunoglobin G), Serum: 1091 mg/dL (ref 586–1602)
IgM (Immunoglobulin M), Srm: 26 mg/dL (ref 26–217)

## 2023-11-14 ENCOUNTER — Other Ambulatory Visit: Payer: Self-pay | Admitting: Neurology

## 2023-11-14 NOTE — Telephone Encounter (Signed)
 Last seen on 09/11/23 Follow up scheduled on 04/16/24

## 2023-11-15 ENCOUNTER — Encounter: Payer: Self-pay | Admitting: Internal Medicine

## 2023-11-15 MED ORDER — TIRZEPATIDE 5 MG/0.5ML ~~LOC~~ SOAJ: 5.0000 mg | SUBCUTANEOUS | 3 refills | Status: DC

## 2023-12-04 ENCOUNTER — Other Ambulatory Visit: Payer: Self-pay | Admitting: Internal Medicine

## 2023-12-04 ENCOUNTER — Other Ambulatory Visit: Payer: Self-pay

## 2024-01-17 ENCOUNTER — Ambulatory Visit: Admitting: Internal Medicine

## 2024-01-29 ENCOUNTER — Ambulatory Visit: Payer: Self-pay | Admitting: Internal Medicine

## 2024-01-29 ENCOUNTER — Encounter: Payer: Self-pay | Admitting: Internal Medicine

## 2024-01-29 ENCOUNTER — Ambulatory Visit: Admitting: Internal Medicine

## 2024-01-29 VITALS — BP 120/76 | HR 75 | Temp 99.0°F | Ht 64.0 in | Wt 178.0 lb

## 2024-01-29 DIAGNOSIS — Z7985 Long-term (current) use of injectable non-insulin antidiabetic drugs: Secondary | ICD-10-CM | POA: Diagnosis not present

## 2024-01-29 DIAGNOSIS — I10 Essential (primary) hypertension: Secondary | ICD-10-CM

## 2024-01-29 DIAGNOSIS — E7800 Pure hypercholesterolemia, unspecified: Secondary | ICD-10-CM

## 2024-01-29 DIAGNOSIS — E559 Vitamin D deficiency, unspecified: Secondary | ICD-10-CM | POA: Diagnosis not present

## 2024-01-29 DIAGNOSIS — Z0001 Encounter for general adult medical examination with abnormal findings: Secondary | ICD-10-CM

## 2024-01-29 DIAGNOSIS — Z0000 Encounter for general adult medical examination without abnormal findings: Secondary | ICD-10-CM | POA: Diagnosis not present

## 2024-01-29 DIAGNOSIS — E1165 Type 2 diabetes mellitus with hyperglycemia: Secondary | ICD-10-CM

## 2024-01-29 LAB — URINALYSIS, ROUTINE W REFLEX MICROSCOPIC
Bilirubin Urine: NEGATIVE
Ketones, ur: NEGATIVE
Leukocytes,Ua: NEGATIVE
Nitrite: NEGATIVE
Specific Gravity, Urine: 1.02 (ref 1.000–1.030)
Total Protein, Urine: NEGATIVE
Urine Glucose: NEGATIVE
Urobilinogen, UA: 0.2 (ref 0.0–1.0)
pH: 7 (ref 5.0–8.0)

## 2024-01-29 LAB — MICROALBUMIN / CREATININE URINE RATIO
Creatinine,U: 71.7 mg/dL
Microalb Creat Ratio: 46.8 mg/g — ABNORMAL HIGH (ref 0.0–30.0)
Microalb, Ur: 3.4 mg/dL — ABNORMAL HIGH (ref 0.7–1.9)

## 2024-01-29 LAB — BASIC METABOLIC PANEL WITH GFR
BUN: 14 mg/dL (ref 6–23)
CO2: 28 meq/L (ref 19–32)
Calcium: 8.9 mg/dL (ref 8.4–10.5)
Chloride: 103 meq/L (ref 96–112)
Creatinine, Ser: 0.74 mg/dL (ref 0.40–1.20)
GFR: 98.77 mL/min
Glucose, Bld: 101 mg/dL — ABNORMAL HIGH (ref 70–99)
Potassium: 3.5 meq/L (ref 3.5–5.1)
Sodium: 138 meq/L (ref 135–145)

## 2024-01-29 LAB — CBC WITH DIFFERENTIAL/PLATELET
Basophils Absolute: 0 K/uL (ref 0.0–0.1)
Basophils Relative: 0.3 % (ref 0.0–3.0)
Eosinophils Absolute: 0.1 K/uL (ref 0.0–0.7)
Eosinophils Relative: 1 % (ref 0.0–5.0)
HCT: 38.4 % (ref 36.0–46.0)
Hemoglobin: 13.3 g/dL (ref 12.0–15.0)
Lymphocytes Relative: 27.8 % (ref 12.0–46.0)
Lymphs Abs: 2.1 K/uL (ref 0.7–4.0)
MCHC: 34.6 g/dL (ref 30.0–36.0)
MCV: 91.2 fl (ref 78.0–100.0)
Monocytes Absolute: 0.6 K/uL (ref 0.1–1.0)
Monocytes Relative: 7.6 % (ref 3.0–12.0)
Neutro Abs: 4.8 K/uL (ref 1.4–7.7)
Neutrophils Relative %: 63.3 % (ref 43.0–77.0)
Platelets: 293 K/uL (ref 150.0–400.0)
RBC: 4.21 Mil/uL (ref 3.87–5.11)
RDW: 13.1 % (ref 11.5–15.5)
WBC: 7.7 K/uL (ref 4.0–10.5)

## 2024-01-29 LAB — HEPATIC FUNCTION PANEL
ALT: 67 U/L — ABNORMAL HIGH (ref 3–35)
AST: 42 U/L — ABNORMAL HIGH (ref 5–37)
Albumin: 4.4 g/dL (ref 3.5–5.2)
Alkaline Phosphatase: 74 U/L (ref 39–117)
Bilirubin, Direct: 0.1 mg/dL (ref 0.1–0.3)
Total Bilirubin: 0.3 mg/dL (ref 0.2–1.2)
Total Protein: 7 g/dL (ref 6.0–8.3)

## 2024-01-29 LAB — LIPID PANEL
Cholesterol: 124 mg/dL (ref 28–200)
HDL: 55.6 mg/dL
LDL Cholesterol: 37 mg/dL (ref 10–99)
NonHDL: 68.1
Total CHOL/HDL Ratio: 2
Triglycerides: 156 mg/dL — ABNORMAL HIGH (ref 10.0–149.0)
VLDL: 31.2 mg/dL (ref 0.0–40.0)

## 2024-01-29 LAB — HEMOGLOBIN A1C: Hgb A1c MFr Bld: 5.6 % (ref 4.6–6.5)

## 2024-01-29 LAB — TSH: TSH: 1.55 u[IU]/mL (ref 0.35–5.50)

## 2024-01-29 MED ORDER — HYDROCHLOROTHIAZIDE 25 MG PO TABS: 25.0000 mg | ORAL_TABLET | Freq: Every day | ORAL | 3 refills | Status: AC

## 2024-01-29 MED ORDER — TRAZODONE HCL 50 MG PO TABS: 50.0000 mg | ORAL_TABLET | Freq: Every evening | ORAL | 1 refills | Status: AC | PRN

## 2024-01-29 MED ORDER — ROSUVASTATIN CALCIUM 20 MG PO TABS: 20.0000 mg | ORAL_TABLET | Freq: Every day | ORAL | 3 refills | Status: AC

## 2024-01-29 MED ORDER — TIRZEPATIDE 7.5 MG/0.5ML ~~LOC~~ SOAJ: 7.5000 mg | SUBCUTANEOUS | 3 refills | Status: DC

## 2024-01-29 MED ORDER — OLMESARTAN MEDOXOMIL 40 MG PO TABS: 40.0000 mg | ORAL_TABLET | Freq: Every day | ORAL | 3 refills | Status: AC

## 2024-01-29 MED ORDER — POTASSIUM CHLORIDE ER 10 MEQ PO TBCR: 10.0000 meq | EXTENDED_RELEASE_TABLET | Freq: Every day | ORAL | 3 refills | Status: AC

## 2024-01-29 MED ORDER — AMLODIPINE BESYLATE 5 MG PO TABS: 5.0000 mg | ORAL_TABLET | Freq: Every day | ORAL | 3 refills | Status: AC

## 2024-01-29 NOTE — Progress Notes (Signed)
 The test results show that your current treatment is OK, as the tests are stable.  Please continue the same plan.  There is no other need for change of treatment or further evaluation based on these results, at this time.  thanks

## 2024-01-29 NOTE — Progress Notes (Signed)
 Patient ID: Victoria Ryan, female   DOB: 03-Jun-1980, 44 y.o.   MRN: [id number]         Chief Complaint:: wellness exam and dm, htn, hld, low vit d       HPI:  Victoria Ryan is a 44 y.o. female here for wellness exam; pt will call for yearly eye exam soon, declines hep B vax, o/w up to date                        Also Pt denies chest pain, increased sob or doe, wheezing, orthopnea, PND, increased LE swelling, palpitations, dizziness or syncope.   Pt denies polydipsia, polyuria, or new focal neuro s/s.    Pt denies fever, wt loss, night sweats, loss of appetite, or other constitutional symptoms     Wt Readings from Last 3 Encounters:  01/29/24 178 lb (80.7 kg)  09/11/23 170 lb 8 oz (77.3 kg)  07/17/23 169 lb (76.7 kg)   BP Readings from Last 3 Encounters:  01/29/24 120/76  09/11/23 131/87  07/17/23 120/74   Immunization History  Administered Date(s) Administered   Influenza, Mdck, Trivalent,PF 6+ MOS(egg free) 09/28/2023   Influenza, Seasonal, Injecte, Preservative Fre 01/11/2023   Influenza,inj,Quad PF,6+ Mos 12/23/2017   Influenza-Unspecified 11/12/2014, 09/25/2018   PFIZER(Purple Top)SARS-COV-2 Vaccination 04/09/2019, 05/04/2019   PNEUMOCOCCAL CONJUGATE-20 08/18/2020   Tdap 06/13/2012, 12/22/2018   Health Maintenance Due  Topic Date Due   Hepatitis B Vaccines 19-59 Average Risk (1 of 3 - 19+ 3-dose series) Never done   OPHTHALMOLOGY EXAM  03/13/2022   Cervical Cancer Screening (HPV/Pap Cotest)  07/17/2022      Past Medical History:  Diagnosis Date   Anxiety    Chicken pox    Gestational diabetes    takes insulin  & metformin    Hypertension    take labetalol    Multiple sclerosis 12/2017   Status post tubal ligation 02/23/2019   Past Surgical History:  Procedure Laterality Date   CESAREAN SECTION     x2   CESAREAN SECTION WITH BILATERAL TUBAL LIGATION Bilateral 02/23/2019   Procedure: CESAREAN SECTION WITH BILATERAL TUBAL LIGATION;  Truax: Victoria Ted Morrison, DO;  Location: [facility];  Service: Obstetrics;  Laterality: Bilateral;    reports that she has quit smoking. Her smoking use included cigars. She has never used smokeless tobacco. She reports current alcohol use. She reports that she does not currently use drugs. family history includes Breast cancer in her paternal grandmother; Heart disease in her father; Hyperlipidemia in her father; Hypertension in her father and mother. Allergies[1] Medications Ordered Prior to Encounter[2]      ROS:  All others reviewed and negative.  Objective        PE:  BP 120/76 (BP Location: Right Arm, Patient Position: Sitting, Cuff Size: Normal)   Pulse 75   Temp 99 F (37.2 C) (Oral)   Ht 5' 4 (1.626 m)   Wt 178 lb (80.7 kg)   LMP 01/21/2024 (Exact Date)   SpO2 99%   BMI 30.55 kg/m                 Constitutional: Pt appears in NAD               HENT: Head: NCAT.                Right Ear: External ear normal.  Left Ear: External ear normal.                Eyes: . Pupils are equal, round, and reactive to light. Conjunctivae and EOM are normal               Nose: without d/c or deformity               Neck: Neck supple. Gross normal ROM               Cardiovascular: Normal rate and regular rhythm.                 Pulmonary/Chest: Effort normal and breath sounds without rales or wheezing.                Abd:  Soft, NT, ND, + BS, no organomegaly               Neurological: Pt is alert. At baseline orientation, motor grossly intact               Skin: Skin is warm. No rashes, no other new lesions, LE edema - none               Psychiatric: Pt behavior is normal without agitation   Micro: none  Cardiac tracings I have personally interpreted today:  none  Pertinent Radiological findings (summarize): none   Lab Results  Component Value Date   WBC 7.7 01/29/2024   HGB 13.3 01/29/2024   HCT 38.4 01/29/2024   PLT 293.0 01/29/2024   GLUCOSE 101 (H) 01/29/2024   CHOL 124  01/29/2024   TRIG 156.0 (H) 01/29/2024   HDL 55.60 01/29/2024   LDLDIRECT 94.0 08/23/2021   LDLCALC 37 01/29/2024   ALT 67 (H) 01/29/2024   AST 42 (H) 01/29/2024   NA 138 01/29/2024   K 3.5 01/29/2024   CL 103 01/29/2024   CREATININE 0.74 01/29/2024   BUN 14 01/29/2024   CO2 28 01/29/2024   TSH 1.55 01/29/2024   HGBA1C 5.6 01/29/2024   MICROALBUR 3.4 (H) 01/29/2024   Assessment/Plan:  Victoria Ryan is a 44 y.o. Black or African American [2] female with  has a past medical history of Anxiety, Chicken pox, Gestational diabetes, Hypertension, Multiple sclerosis (12/2017), and Status post tubal ligation (02/23/2019).  Encounter for well adult exam with abnormal findings Age and sex appropriate education and counseling updated with regular exercise and diet Referrals for preventative services - pt will call for yearly eye exam Immunizations addressed - declines Hep B Smoking counseling  - none needed Evidence for depression or other mood disorder - none significant Most recent labs reviewed. I have personally reviewed and have noted: 1) the patient's medical and social history 2) The patient's current medications and supplements 3) The patient's height, weight, and BMI have been recorded in the chart   Vitamin D  deficiency Last vitamin D  Lab Results  Component Value Date   VD25OH 46.02 07/17/2023   Stable, cont oral replacement   Hyperlipidemia Lab Results  Component Value Date   LDLCALC 37 01/29/2024   Stable, pt to continue current statin crestor  20 mg qd   Diabetes (HCC) With hyperglycemia, without insulin   Lab Results  Component Value Date   HGBA1C 5.6 01/29/2024   With persistent obesity uncontrolled, pt to increase mounjaro  7.5 mg weekly   Essential hypertension, benign BP Readings from Last 3 Encounters:  01/29/24 120/76  09/11/23 131/87  07/17/23 120/74   Stable, pt to continue  medical treatment norvasc  5 every day, hct 25 every day, benicar  40  mg qd  Followup: Return in about 6 months (around 07/28/2024).  Victoria Rush, MD 02/01/2024 7:54 PM [facility]     [1]  Allergies Allergen Reactions   Cats Claw (Uncaria Tomentosa)   [2]  Current Outpatient Medications on File Prior to Visit  Medication Sig Dispense Refill   Continuous Glucose Receiver (FREESTYLE LIBRE 3 READER) DEVI Use as directed twice per day  E11.9 (Patient not taking: Reported on 09/11/2023) 1 each 0   Continuous Glucose Sensor (FREESTYLE LIBRE 3 PLUS SENSOR) MISC Change sensor every 15 days. E11.9 (Patient not taking: Reported on 09/11/2023) 6 each 3   ocrelizumab  (OCREVUS ) 300 MG/10ML injection Inject 20 mLs (600 mg total) into the vein every 6 (six) months. 20 mL 5   No current facility-administered medications on file prior to visit.

## 2024-01-29 NOTE — Patient Instructions (Signed)
 Ok to increase the mounjaro  to 7.5 mg weekly  Please remember to call for your yearly eye exam  Please continue all other medications as before, and refills have been done if requested.  Please have the pharmacy call with any other refills you may need.  Please continue your efforts at being more active, low cholesterol diet, and weight control.  You are otherwise up to date with prevention measures today.  Please keep your appointments with your specialists as you may have planned  Please go to the LAB at the blood drawing area for the tests to be done  You will be contacted by phone if any changes need to be made immediately.  Otherwise, you will receive a letter about your results with an explanation, but please check with MyChart first.  Please make an Appointment to return in 6 months, or sooner if needed

## 2024-02-01 ENCOUNTER — Encounter: Payer: Self-pay | Admitting: Internal Medicine

## 2024-02-01 NOTE — Assessment & Plan Note (Signed)
 With hyperglycemia, without insulin   Lab Results  Component Value Date   HGBA1C 5.6 01/29/2024   With persistent obesity uncontrolled, pt to increase mounjaro  7.5 mg weekly

## 2024-02-01 NOTE — Assessment & Plan Note (Signed)
 Age and sex appropriate education and counseling updated with regular exercise and diet Referrals for preventative services - pt will call for yearly eye exam Immunizations addressed - declines Hep B Smoking counseling  - none needed Evidence for depression or other mood disorder - none significant Most recent labs reviewed. I have personally reviewed and have noted: 1) the patient's medical and social history 2) The patient's current medications and supplements 3) The patient's height, weight, and BMI have been recorded in the chart

## 2024-02-01 NOTE — Assessment & Plan Note (Signed)
 BP Readings from Last 3 Encounters:  01/29/24 120/76  09/11/23 131/87  07/17/23 120/74   Stable, pt to continue medical treatment norvasc  5 every day, hct 25 every day, benicar  40 mg qd

## 2024-02-01 NOTE — Assessment & Plan Note (Signed)
 Lab Results  Component Value Date   LDLCALC 37 01/29/2024   Stable, pt to continue current statin crestor  20 mg qd

## 2024-02-01 NOTE — Assessment & Plan Note (Signed)
 Last vitamin D  Lab Results  Component Value Date   VD25OH 46.02 07/17/2023   Stable, cont oral replacement

## 2024-02-03 ENCOUNTER — Other Ambulatory Visit: Payer: Self-pay | Admitting: Internal Medicine

## 2024-02-03 MED ORDER — TIRZEPATIDE 7.5 MG/0.5ML ~~LOC~~ SOAJ: 7.5000 mg | SUBCUTANEOUS | 3 refills | Status: AC

## 2024-02-18 ENCOUNTER — Telehealth: Payer: Self-pay | Admitting: *Deleted

## 2024-02-18 NOTE — Telephone Encounter (Signed)
 [employer] needs updated [place] Dx code for Ocrevus .

## 2024-02-19 NOTE — Telephone Encounter (Signed)
 Per Dr Vear, [email or web address]. Dx code placed on sheet for [employer].

## 2024-04-16 ENCOUNTER — Ambulatory Visit: Admitting: Neurology

## 2024-06-25 ENCOUNTER — Ambulatory Visit: Admitting: Nurse Practitioner
